# Patient Record
Sex: Female | Born: 1940 | Race: White | Hispanic: No | Marital: Married | Smoking: Never smoker
Health system: Southern US, Community
[De-identification: ages and names within clinical notes are randomized; demographics above are authoritative.]

## PROBLEM LIST (undated history)

## (undated) DIAGNOSIS — I639 Cerebral infarction, unspecified: Secondary | ICD-10-CM

## (undated) DIAGNOSIS — Z9889 Other specified postprocedural states: Secondary | ICD-10-CM

## (undated) DIAGNOSIS — F32A Depression, unspecified: Secondary | ICD-10-CM

## (undated) DIAGNOSIS — T8859XA Other complications of anesthesia, initial encounter: Secondary | ICD-10-CM

## (undated) DIAGNOSIS — E079 Disorder of thyroid, unspecified: Secondary | ICD-10-CM

## (undated) DIAGNOSIS — H3530 Unspecified macular degeneration: Secondary | ICD-10-CM

## (undated) DIAGNOSIS — I10 Essential (primary) hypertension: Secondary | ICD-10-CM

## (undated) DIAGNOSIS — R112 Nausea with vomiting, unspecified: Secondary | ICD-10-CM

## (undated) DIAGNOSIS — G4700 Insomnia, unspecified: Secondary | ICD-10-CM

## (undated) DIAGNOSIS — E785 Hyperlipidemia, unspecified: Secondary | ICD-10-CM

## (undated) DIAGNOSIS — M1990 Unspecified osteoarthritis, unspecified site: Secondary | ICD-10-CM

## (undated) DIAGNOSIS — E039 Hypothyroidism, unspecified: Secondary | ICD-10-CM

## (undated) DIAGNOSIS — Z9842 Cataract extraction status, left eye: Secondary | ICD-10-CM

## (undated) DIAGNOSIS — F329 Major depressive disorder, single episode, unspecified: Secondary | ICD-10-CM

## (undated) HISTORY — DX: Essential (primary) hypertension: I10

## (undated) HISTORY — PX: CATARACT EXTRACTION: SUR2

## (undated) HISTORY — PX: OTHER SURGICAL HISTORY: SHX169

## (undated) HISTORY — DX: Disorder of thyroid, unspecified: E07.9

## (undated) HISTORY — PX: CHOLECYSTECTOMY: SHX55

## (undated) HISTORY — PX: EYE SURGERY: SHX253

## (undated) HISTORY — PX: BREAST SURGERY: SHX581

## (undated) HISTORY — DX: Major depressive disorder, single episode, unspecified: F32.9

## (undated) HISTORY — DX: Depression, unspecified: F32.A

## (undated) HISTORY — DX: Cerebral infarction, unspecified: I63.9

## (undated) HISTORY — DX: Hyperlipidemia, unspecified: E78.5

## (undated) HISTORY — DX: Insomnia, unspecified: G47.00

## (undated) HISTORY — DX: Cataract extraction status, left eye: Z98.42

---

## 1999-09-02 ENCOUNTER — Other Ambulatory Visit: Admission: RE | Admit: 1999-09-02 | Discharge: 1999-09-02 | Payer: Self-pay | Admitting: Family Medicine

## 1999-11-21 ENCOUNTER — Ambulatory Visit (HOSPITAL_COMMUNITY): Admission: RE | Admit: 1999-11-21 | Discharge: 1999-11-21 | Payer: Self-pay

## 2001-10-09 ENCOUNTER — Other Ambulatory Visit: Admission: RE | Admit: 2001-10-09 | Discharge: 2001-10-09 | Payer: Self-pay | Admitting: Family Medicine

## 2002-04-16 ENCOUNTER — Emergency Department (HOSPITAL_COMMUNITY): Admission: EM | Admit: 2002-04-16 | Discharge: 2002-04-16 | Payer: Self-pay | Admitting: Emergency Medicine

## 2002-04-16 ENCOUNTER — Encounter: Payer: Self-pay | Admitting: Emergency Medicine

## 2002-07-18 ENCOUNTER — Encounter: Payer: Self-pay | Admitting: Family Medicine

## 2002-07-18 ENCOUNTER — Ambulatory Visit (HOSPITAL_COMMUNITY): Admission: RE | Admit: 2002-07-18 | Discharge: 2002-07-18 | Payer: Self-pay | Admitting: Family Medicine

## 2003-06-18 ENCOUNTER — Encounter: Admission: RE | Admit: 2003-06-18 | Discharge: 2003-08-15 | Payer: Self-pay | Admitting: Family Medicine

## 2004-10-16 ENCOUNTER — Ambulatory Visit: Payer: Self-pay | Admitting: Gastroenterology

## 2004-10-30 ENCOUNTER — Ambulatory Visit: Payer: Self-pay | Admitting: Gastroenterology

## 2006-04-25 ENCOUNTER — Other Ambulatory Visit: Admission: RE | Admit: 2006-04-25 | Discharge: 2006-04-25 | Payer: Self-pay | Admitting: Family Medicine

## 2007-11-01 ENCOUNTER — Ambulatory Visit: Payer: Self-pay | Admitting: Gastroenterology

## 2007-11-01 LAB — CONVERTED CEMR LAB
Anti Nuclear Antibody(ANA): POSITIVE — AB
Basophils Relative: 0.3 % (ref 0.0–1.0)
Bilirubin, Direct: 0.1 mg/dL (ref 0.0–0.3)
Ceruloplasmin: 43 mg/dL (ref 21–63)
Eosinophils Absolute: 0 10*3/uL (ref 0.0–0.6)
Eosinophils Relative: 1 % (ref 0.0–5.0)
HCT: 36 % (ref 36.0–46.0)
Hemoglobin: 12.5 g/dL (ref 12.0–15.0)
Hep B S Ab: NEGATIVE
Hepatitis B Surface Ag: NEGATIVE
Lymphocytes Relative: 25.7 % (ref 12.0–46.0)
MCV: 93.9 fL (ref 78.0–100.0)
Neutro Abs: 2.6 10*3/uL (ref 1.4–7.7)
Neutrophils Relative %: 63.4 % (ref 43.0–77.0)
Prothrombin Time: 11.7 s (ref 10.9–13.3)
Total Bilirubin: 0.8 mg/dL (ref 0.3–1.2)
Total Protein: 6.5 g/dL (ref 6.0–8.3)
WBC: 4 10*3/uL — ABNORMAL LOW (ref 4.5–10.5)

## 2008-01-23 ENCOUNTER — Ambulatory Visit: Payer: Self-pay | Admitting: Gastroenterology

## 2008-01-23 LAB — CONVERTED CEMR LAB
ALT: 52 units/L — ABNORMAL HIGH (ref 0–35)
Bilirubin, Direct: 0.1 mg/dL (ref 0.0–0.3)
Total Bilirubin: 0.7 mg/dL (ref 0.3–1.2)

## 2008-01-24 ENCOUNTER — Ambulatory Visit: Payer: Self-pay | Admitting: Gastroenterology

## 2009-04-09 ENCOUNTER — Encounter: Payer: Self-pay | Admitting: Gastroenterology

## 2009-04-30 ENCOUNTER — Encounter: Payer: Self-pay | Admitting: Gastroenterology

## 2009-05-01 ENCOUNTER — Ambulatory Visit: Payer: Self-pay | Admitting: Gastroenterology

## 2009-05-01 DIAGNOSIS — R945 Abnormal results of liver function studies: Secondary | ICD-10-CM | POA: Insufficient documentation

## 2009-05-08 ENCOUNTER — Encounter: Payer: Self-pay | Admitting: Gastroenterology

## 2009-05-08 ENCOUNTER — Ambulatory Visit (HOSPITAL_COMMUNITY): Admission: RE | Admit: 2009-05-08 | Discharge: 2009-05-08 | Payer: Self-pay | Admitting: Gastroenterology

## 2009-08-19 ENCOUNTER — Encounter: Admission: RE | Admit: 2009-08-19 | Discharge: 2009-08-19 | Payer: Self-pay | Admitting: Orthopedic Surgery

## 2009-12-13 ENCOUNTER — Emergency Department (HOSPITAL_COMMUNITY): Admission: EM | Admit: 2009-12-13 | Discharge: 2009-12-13 | Payer: Self-pay | Admitting: Emergency Medicine

## 2010-03-31 ENCOUNTER — Encounter: Payer: Self-pay | Admitting: Cardiology

## 2010-04-06 ENCOUNTER — Ambulatory Visit: Payer: Self-pay | Admitting: Cardiology

## 2010-04-06 DIAGNOSIS — I6930 Unspecified sequelae of cerebral infarction: Secondary | ICD-10-CM | POA: Insufficient documentation

## 2010-04-06 DIAGNOSIS — I10 Essential (primary) hypertension: Secondary | ICD-10-CM | POA: Insufficient documentation

## 2010-04-06 DIAGNOSIS — E785 Hyperlipidemia, unspecified: Secondary | ICD-10-CM | POA: Insufficient documentation

## 2010-05-05 ENCOUNTER — Telehealth (INDEPENDENT_AMBULATORY_CARE_PROVIDER_SITE_OTHER): Payer: Self-pay | Admitting: *Deleted

## 2010-05-08 ENCOUNTER — Telehealth (INDEPENDENT_AMBULATORY_CARE_PROVIDER_SITE_OTHER): Payer: Self-pay | Admitting: *Deleted

## 2010-06-01 ENCOUNTER — Inpatient Hospital Stay (HOSPITAL_COMMUNITY): Admission: RE | Admit: 2010-06-01 | Discharge: 2010-06-06 | Payer: Self-pay | Admitting: Orthopedic Surgery

## 2010-06-02 ENCOUNTER — Ambulatory Visit: Payer: Self-pay | Admitting: Physical Medicine & Rehabilitation

## 2010-11-26 NOTE — Progress Notes (Signed)
Summary: send clearance letter to [facility]  Phone Note From Other Clinic Call back at [facility] Phone [phone]   Caller: Olegario Messier from [facility] [phone] Request: Talk with Nurse Details for Reason: pls send clearance letter to [facility] fax # [phone]. att: Olegario Messier  Initial call taken by: Lorne Skeens,  May 08, 2010 10:38 AM  Follow-up for Phone Call        this letter was faxed 6/14 and 05/05/2010 and is being faxed again today Follow-up by: Charolotte Capuchin, RN,  May 08, 2010 10:53 AM

## 2010-11-26 NOTE — Miscellaneous (Signed)
Summary: Allergy  Allergy   Imported By: Elenor Legato 04/06/2010 12:39:31  _____________________________________________________________________  External Attachment:    Type:   Image     Comment:   External Document

## 2010-11-26 NOTE — Progress Notes (Signed)
Summary: cardiac clearence letter  Phone Note Call from Patient   Caller: Patient Reason for Call: Talk to Nurse Summary of Call: pt's cardiac clearence letter needs to be sent to [facility] att cathy blume [phone] Initial call taken by: Glynda Jaeger,  May 05, 2010 2:05 PM  Follow-up for Phone Call        letter (office note) faxed to [facility] - Att Consuela Mimes Follow-up by: Charolotte Capuchin, RN,  May 05, 2010 2:23 PM

## 2010-11-26 NOTE — Letter (Signed)
Summary: [facility]  [facility]   Imported By: Marylou Mccoy 05/27/2010 10:51:32  _____________________________________________________________________  External Attachment:    Type:   Image     Comment:   External Document

## 2010-11-26 NOTE — Assessment & Plan Note (Signed)
Summary: NP6/CARDIAC CLEARENCE BEFORE KNEE REPLAC/AETN/MT   Visit Type:  Follow-up Referring Provider:  Gean Birchwood Primary Provider:  [facility]  CC:  HTN and Hyperlipidemia.  History of Present Illness: The patient presents for evaluation prior to having knee replacement. Her past cardiovascular history includes an embolic stroke in 1997. The etiology of the presumed embolism was never made clear. She had residual right-sided weakness as a result. She's been limited now by knee pain and falls. She walks with walker. She walks around her home. With this level of activity she denies any chest pressure, neck or arm discomfort. She does not have palpitations, presyncope or syncope. She does not have PND or orthopnea. She thinks her level of breathing is in keeping with her weight, deconditioning and level of activity. She does not report any prior cardiac testing other than EKGs.  Current Medications (verified): 1)  Carbatrol 300 Mg Xr12h-Cap (Carbamazepine) .... 2 By Mouth Once Daily 2)  Citalopram Hydrobromide 40 Mg Tabs (Citalopram Hydrobromide) .Marland Kitchen.. 1 By Mouth Once Daily 3)  Simvastatin 20 Mg Tabs (Simvastatin) .Marland Kitchen.. 1 By Mouth Once Daily 4)  Hydrocodone-Acetaminophen 10-325 Mg Tabs (Hydrocodone-Acetaminophen) .... One Tablet By Mouth Four Times Daily As Needed 5)  Aspirin 325 Mg Tabs (Aspirin) .Marland Kitchen.. 1 By Mouth Once Daily 6)  Flax Seed Oil 1000 Mg Caps (Flaxseed (Linseed)) .... 2 Tablets By Mouth Once Daily 7)  Caltrate 600 1500 Mg Tabs (Calcium Carbonate) .... One Tablet By Mouth  Two Times A Day 8)  Fish Oil   Oil (Fish Oil) .... One Tablet By Mouth Once Daily 9)  Eye Vitamins  Caps (Multiple Vitamins-Minerals) .... Two Tablets By Mouth Two Times A Day 10)  Vitamin D 1000 Unit Tabs (Cholecalciferol) .... One Tablet By Mouth Once Daily 11)  Super B Complex  Tabs (B Complex-C) .... One Tablet By Mouth Once Daily 12)  Tribenzor 40-10-25 Mg Tabs  (Olmesartan-Amlodipine-Hctz) .Marland Kitchen.. 1 By Mouth Daily  Allergies (verified): No Known Drug Allergies  Past History:  Past Medical History: Diverticulosis Anxiety Disorder Arthritis Hypertension Embolic CVA Hyperlipidemia  Family History: No FH of Colon Cancer: Lung Cancer: Father  There is no early heart disease in first-degree relatives.  Review of Systems       Positive for right-sided resting tremor, thalamic pain syndrome. Otherwise as stated in the history of present illness negative for all other systems.  Vital Signs:  Patient profile:   69 year old female Height:      65 inches Weight:      181 pounds BMI:     30.23 Pulse rate:   79 / minute Resp:     18 per minute BP sitting:   140 / 62  (left arm)  Vitals Entered By: Marrion Coy, CNA (April 06, 2010 12:33 PM)  Physical Exam  General:  Well developed, well nourished, in no acute distress. Head:  normocephalic and atraumatic Eyes:  PERRLA/EOM intact; conjunctiva and lids normal. Mouth:  Teeth, gums and palate normal. Oral mucosa normal. Neck:  Neck supple, no JVD. No masses, thyromegaly or abnormal cervical nodes. Chest Wall:  no deformities or breast masses noted Lungs:  Clear bilaterally to auscultation and percussion. Abdomen:  Bowel sounds positive; abdomen soft and non-tender without masses, organomegaly, or hernias noted. No hepatosplenomegaly. Msk:  Back normal, abnormal gait, right-sided muscle weakness upper or lower extremities mild contractures Extremities:  No clubbing or cyanosis. Neurologic:  Alert and oriented x 3. Skin:  Intact without lesions or  rashes. Cervical Nodes:  no significant adenopathy Axillary Nodes:  no significant adenopathy Inguinal Nodes:  no significant adenopathy Psych:  Normal affect.   Detailed Cardiovascular Exam  Neck    Carotids: Carotids full and equal bilaterally without bruits.      Neck Veins: Normal, no JVD.    Heart    Inspection: no deformities or lifts  noted.      Palpation: normal PMI with no thrills palpable.      Auscultation: regular rate and rhythm, S1, S2 without murmurs, rubs, gallops, or clicks.    Vascular    Abdominal Aorta: no palpable masses, pulsations, or audible bruits.      Femoral Pulses: normal femoral pulses bilaterally.      Pedal Pulses: normal pedal pulses bilaterally.      Radial Pulses: normal radial pulses bilaterally.      Peripheral Circulation: no clubbing, cyanosis, or edema noted with normal capillary refill.     EKG  Procedure date:  04/06/2010  Findings:      Sinus rhythm, rate 79, axis within normal limits, intervals within normal limits, no acute ST-T wave changes.  Impression & Recommendations:  Problem # 1:  PREOPERATIVE EXAMINATION (ICD-V72.84) The patient has no high risk findings according to ACC/AHA guidelines. She does have a low functional level. She is going for a study that is no higher than moderate risk from a cardiovascular standpoint. Given this, according to guidelines, no further cardiovascular testing is suggested. The patient would be an acceptable risk for the planned surgery.  Problem # 2:  ESSENTIAL HYPERTENSION, BENIGN (ICD-401.1) We reviewed her blood pressures at home. At this point I would recommend diet and weight control but would consider adding antihypertensive medication if her pressure is seen to be elevated in the future. Orders: EKG w/ Interpretation (93000)  Problem # 3:  DYSLIPIDEMIA (ICD-272.4) I reviewed her lipid profiles available to me. I don't see any recent ones but she says they are drawn routinely by her primary provider. I will defer to his care.  Patient Instructions: 1)  Your physician recommends that you schedule a follow-up appointment as needed 2)  Your physician recommends that you continue on your current medications as directed. Please refer to the Current Medication list given to you today.

## 2011-01-08 LAB — BASIC METABOLIC PANEL
CO2: 28 mEq/L (ref 19–32)
CO2: 28 mEq/L (ref 19–32)
Calcium: 8.4 mg/dL (ref 8.4–10.5)
Calcium: 9.8 mg/dL (ref 8.4–10.5)
Chloride: 92 mEq/L — ABNORMAL LOW (ref 96–112)
Chloride: 92 mEq/L — ABNORMAL LOW (ref 96–112)
Creatinine, Ser: 0.44 mg/dL (ref 0.4–1.2)
GFR calc Af Amer: >60 mL/min (ref >60)
GFR calc Af Amer: >60 mL/min (ref >60)
GFR calc Af Amer: >60 mL/min (ref >60)
GFR calc non Af Amer: >60 mL/min (ref >60)
GFR calc non Af Amer: >60 mL/min (ref >60)
Glucose, Bld: 93 mg/dL (ref 70–99)
Potassium: 3.3 mEq/L — ABNORMAL LOW (ref 3.5–5.1)
Potassium: 3.6 mEq/L (ref 3.5–5.1)
Potassium: 3.7 mEq/L (ref 3.5–5.1)
Sodium: 124 mEq/L — ABNORMAL LOW (ref 135–145)
Sodium: 129 mEq/L — ABNORMAL LOW (ref 135–145)
Sodium: 129 mEq/L — ABNORMAL LOW (ref 135–145)
Sodium: 132 mEq/L — ABNORMAL LOW (ref 135–145)

## 2011-01-08 LAB — URINALYSIS, ROUTINE W REFLEX MICROSCOPIC
Bilirubin Urine: NEGATIVE
Ketones, ur: NEGATIVE mg/dL
Nitrite: NEGATIVE
Protein, ur: NEGATIVE mg/dL
pH: 7.5 (ref 5.0–8.0)

## 2011-01-08 LAB — DIFFERENTIAL
Basophils Relative: 0 % (ref 0–1)
Eosinophils Absolute: 0.1 10*3/uL (ref 0.0–0.7)
Eosinophils Relative: 1 % (ref 0–5)
Lymphs Abs: 1.5 10*3/uL (ref 0.7–4.0)
Monocytes Absolute: 0.5 10*3/uL (ref 0.1–1.0)
Monocytes Relative: 9 % (ref 3–12)

## 2011-01-08 LAB — PROTIME-INR
INR: 0.93 (ref 0.00–1.49)
INR: 0.97 (ref 0.00–1.49)
INR: 1.37 (ref 0.00–1.49)
INR: 1.38 (ref 0.00–1.49)
Prothrombin Time: 13.1 seconds (ref 11.6–15.2)
Prothrombin Time: 17.1 seconds — ABNORMAL HIGH (ref 11.6–15.2)

## 2011-01-08 LAB — CBC
HCT: 26.7 % — ABNORMAL LOW (ref 36.0–46.0)
HCT: 28.6 % — ABNORMAL LOW (ref 36.0–46.0)
HCT: 29.5 % — ABNORMAL LOW (ref 36.0–46.0)
HCT: 37.2 % (ref 36.0–46.0)
Hemoglobin: 10.2 g/dL — ABNORMAL LOW (ref 12.0–15.0)
Hemoglobin: 13.2 g/dL (ref 12.0–15.0)
Hemoglobin: 9.1 g/dL — ABNORMAL LOW (ref 12.0–15.0)
Hemoglobin: 9.9 g/dL — ABNORMAL LOW (ref 12.0–15.0)
MCH: 31.6 pg (ref 26.0–34.0)
MCH: 32.2 pg (ref 26.0–34.0)
MCHC: 35.5 g/dL (ref 30.0–36.0)
MCV: 90.7 fL (ref 78.0–100.0)
MCV: 91.8 fL (ref 78.0–100.0)
Platelets: 184 10*3/uL (ref 150–400)
RBC: 2.91 MIL/uL — ABNORMAL LOW (ref 3.87–5.11)
RBC: 3.15 MIL/uL — ABNORMAL LOW (ref 3.87–5.11)
RBC: 3.23 MIL/uL — ABNORMAL LOW (ref 3.87–5.11)
RBC: 4.1 MIL/uL (ref 3.87–5.11)
WBC: 6.9 10*3/uL (ref 4.0–10.5)
WBC: 7.7 10*3/uL (ref 4.0–10.5)

## 2011-01-08 LAB — TYPE AND SCREEN
ABO/RH(D): O POS
Antibody Screen: NEGATIVE

## 2011-01-08 LAB — URINE MICROSCOPIC-ADD ON

## 2011-01-08 LAB — SURGICAL PCR SCREEN: MRSA, PCR: NEGATIVE

## 2011-01-08 LAB — ABO/RH: ABO/RH(D): O POS

## 2011-03-09 NOTE — Assessment & Plan Note (Signed)
[facility]                         GASTROENTEROLOGY OFFICE NOTE   SELINE, ENZOR                         MRN:          [id number]  DATE:11/01/2007                            DOB:          April 18, 1941    GASTROENTEROLOGY CONSULTATION   REASON FOR CONSULTATION:  Abnormal liver tests.   HISTORY OF PRESENT ILLNESS:  Allison Parker is a pleasant 70 year old  white female referred through the courtesy of Dr. Jacqulyn Bath for evaluation.  Abnormal liver tests were recently noted.  Allison Parker has no GI  complaints including change in bowel habits, abdominal pain, or weight  loss.  She had hepatitis as a child.  She drinks rarely.  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   PAST MEDICAL HISTORY:  Past medical history is pertinent for a CVA.  She  has hypertension.  She is status post cholecystectomy and lumpectomy.  She underwent screening colonoscopy in 2006 that demonstrated  diverticulosis.   MEDICATIONS:  Her medications include carvedilol, Citalopram,  amlodipine, simvastatin, aspirin.   ALLERGIES:  She has no allergies.   SOCIAL HISTORY:  She does not smoke.  She drinks rarely.  She is married  and works at [employer].   REVIEW OF SYSTEMS:  Positive for urinary frequency and joint pains,  night sweats and back pain.   PHYSICAL EXAMINATION:  VITAL SIGNS:  Pulse 68, blood pressure 130/80.  Weight 190.  SKIN:  There is no stigmata of liver disease.  HEENT:  EOMI.  PERRLA.  Sclerae are anicteric.  Conjunctivae are pink.  NECK:  Supple without thyromegaly, adenopathy or carotid bruits.  CHEST:  Clear to auscultation and percussion without adventitious  sounds.  CARDIAC:  Regular rhythm; normal S1 S2.  There are no murmurs, gallops  or rubs.  ABDOMEN:  Bowel sounds are normoactive.  Abdomen is soft, nontender and  nondistended.  There are no abdominal masses, tenderness, splenic  enlargement or  hepatomegaly.  EXTREMITIES:  Full range of motion.  No cyanosis, clubbing or edema.  RECTAL:  Deferred.   LABORATORY DATA:  On September 28, 2007 AST was 41, ALT 97, GTT 573 and  alkaline phosphatase 132.  Bilirubin was normal.   IMPRESSION:  1. Abnormal liver tests.  I suspect this is due to mild hepatic      steatosis.  2. Diverticulosis.  3. History of cerebrovascular accident.   RECOMMENDATIONS:  1. Check serologies for hepatitis B and C, alpha fetoprotein, AMA,      ANA, antismooth muscle antibody, ceruloplasmin level.  2. If the above are negative, I will repeat her liver function tests      in approximately 3 months.     Barbette Hair. Arlyce Dice, MD,FACG  Electronically Signed    RDK/MedQ  DD: 11/01/2007  DT: 11/01/2007  Job #: [id number]   cc:   Dr. Lindaann Pascal

## 2011-03-09 NOTE — Letter (Signed)
November 01, 2007    Allison Parker   RE:  ROVENA, HEARLD  MRN:  [id number]  /  DOB:  [birth year]   Dear Allison Hong:   It is my pleasure to have treated you recently as a new patient in my  office.  I appreciate your confidence and the opportunity to participate  in your care.   Since I do have a busy inpatient endoscopy schedule and office schedule,  my office hours vary weekly.  I am, however, available for emergency  calls every day through my office.  If I cannot promptly meet an urgent  office appointment, another one of our gastroenterologists will be able  to assist you.   My well-trained staff are prepared to help you at all times.  For  emergencies after office hours, a physician from our gastroenterology  section is always available through my 24-hour answering service.   While you are under my care, I encourage discussion of your questions  and concerns, and I will be happy to return your calls as soon as I am  available.   Once again, I welcome you as a new patient and I look forward to a happy  and healthy relationship.    Sincerely,      Barbette Hair. Arlyce Dice, MD,FACG  Electronically Signed   RDK/MedQ  DD: 11/01/2007  DT: 11/01/2007  Job #: [id number]

## 2011-03-09 NOTE — Letter (Signed)
November 01, 2007    Lindaann Pascal, M.D.   RE:  [name], [name]  MRN:  [id number]  /  DOB:  24-Jun-1941   Dear Dr. Jacqulyn Bath:   Upon your kind referral, I had the pleasure of evaluating your patient  and I am pleased to offer my findings.  I saw Allison Parker in the office  today.  Enclosed is a copy of my progress note that details my findings  and recommendations.   Thank you for the opportunity to participate in your patient's care.    Sincerely,      Barbette Hair. Arlyce Dice, MD,FACG  Electronically Signed    RDK/MedQ  DD: 11/01/2007  DT: 11/01/2007  Job #: [id number]

## 2011-03-09 NOTE — Assessment & Plan Note (Signed)
[facility]                         GASTROENTEROLOGY OFFICE NOTE   [name], [name]                         MRN:          [id number]  DATE:01/24/2008                            DOB:          09/29/1941    PROBLEM:  Abnormal liver tests.   Allison Parker has returned for scheduled GI followup.  She continues to  feel well.  Repeat liver tests were pertinent for an ALT of 52.  Other  liver tests were normal.  Serologies for hepatitis B and C were  negative.  There is no evidence by serologic testing for Wilson's  disease, autoimmune hepatitis, or alpha 1 antitrypsin deficiency or PBC.   EXAM:  Pulse 68, blood pressure 132/80, weight 182.   IMPRESSION:  Transient abnormal liver tests.  Etiology is not clear,  though there does not appear to be evidence for underlying liver  disease.   RECOMMENDATIONS:  1. No further GI workup.  2. Repeat LFTs in approximately three months.  I instructed Mrs.      Parker to have those checked at Dr. Edwin Dada office.     Barbette Hair. Arlyce Dice, MD,FACG  Electronically Signed    RDK/MedQ  DD: 01/24/2008  DT: 01/24/2008  Job #: [id number]

## 2011-05-26 HISTORY — PX: TOTAL KNEE ARTHROPLASTY: SHX125

## 2011-11-04 ENCOUNTER — Encounter: Payer: Self-pay | Admitting: Gastroenterology

## 2013-01-15 ENCOUNTER — Telehealth: Payer: Self-pay | Admitting: Pharmacist

## 2013-01-15 MED ORDER — HYDROCODONE-ACETAMINOPHEN 10-325 MG PO TABS: 1.0000 | ORAL_TABLET | Freq: Four times a day (QID) | ORAL | Status: DC | PRN

## 2013-01-15 NOTE — Telephone Encounter (Signed)
Pt requesting refill on hydrocodone/APAP 10/325mg  1 po QID prn pain.   Last rx written 11/15/2012. Please fax to [facility] once signed

## 2013-02-14 ENCOUNTER — Other Ambulatory Visit: Payer: Self-pay | Admitting: Pharmacist

## 2013-02-14 DIAGNOSIS — G890 Central pain syndrome: Secondary | ICD-10-CM

## 2013-02-14 DIAGNOSIS — M4800 Spinal stenosis, site unspecified: Secondary | ICD-10-CM

## 2013-02-14 MED ORDER — HYDROCODONE-ACETAMINOPHEN 10-325 MG PO TABS: 1.0000 | ORAL_TABLET | Freq: Four times a day (QID) | ORAL | Status: DC | PRN

## 2013-02-14 NOTE — Telephone Encounter (Signed)
Rx was actually called to [facility] due to poor quality of transmission when faxed.   Patient notified.

## 2013-02-14 NOTE — Telephone Encounter (Signed)
Rx for hydrocodone/APAP 10/325mg  take 1 tablet QID printed for Dr. Modesto Charon to sign.

## 2013-02-26 ENCOUNTER — Other Ambulatory Visit: Payer: Self-pay | Admitting: Family Medicine

## 2013-02-27 ENCOUNTER — Ambulatory Visit (INDEPENDENT_AMBULATORY_CARE_PROVIDER_SITE_OTHER): Payer: Medicare FFS

## 2013-02-27 ENCOUNTER — Encounter: Payer: Self-pay | Admitting: Family Medicine

## 2013-02-27 ENCOUNTER — Ambulatory Visit (INDEPENDENT_AMBULATORY_CARE_PROVIDER_SITE_OTHER): Payer: Medicare FFS | Admitting: Family Medicine

## 2013-02-27 VITALS — BP 145/78 | HR 76 | Temp 98.5°F | Wt 188.6 lb

## 2013-02-27 DIAGNOSIS — R1031 Right lower quadrant pain: Secondary | ICD-10-CM

## 2013-02-27 DIAGNOSIS — I6350 Cerebral infarction due to unspecified occlusion or stenosis of unspecified cerebral artery: Secondary | ICD-10-CM

## 2013-02-27 DIAGNOSIS — R350 Frequency of micturition: Secondary | ICD-10-CM

## 2013-02-27 DIAGNOSIS — G4700 Insomnia, unspecified: Secondary | ICD-10-CM

## 2013-02-27 DIAGNOSIS — G8929 Other chronic pain: Secondary | ICD-10-CM

## 2013-02-27 DIAGNOSIS — E039 Hypothyroidism, unspecified: Secondary | ICD-10-CM

## 2013-02-27 DIAGNOSIS — I10 Essential (primary) hypertension: Secondary | ICD-10-CM

## 2013-02-27 DIAGNOSIS — E785 Hyperlipidemia, unspecified: Secondary | ICD-10-CM

## 2013-02-27 DIAGNOSIS — R945 Abnormal results of liver function studies: Secondary | ICD-10-CM

## 2013-02-27 LAB — COMPLETE METABOLIC PANEL WITH GFR
ALT: 16 U/L (ref 0–35)
AST: 18 U/L (ref 0–37)
Albumin: 4.3 g/dL (ref 3.5–5.2)
Alkaline Phosphatase: 89 U/L (ref 39–117)
BUN: 14 mg/dL (ref 6–23)
CO2: 26 mEq/L (ref 19–32)
Calcium: 9.5 mg/dL (ref 8.4–10.5)
Chloride: 102 mEq/L (ref 96–112)
Creat: 0.69 mg/dL (ref 0.50–1.10)
GFR, Est African American: >89 mL/min
GFR, Est Non African American: 89 mL/min
Glucose, Bld: 87 mg/dL (ref 70–99)
Potassium: 4.5 mEq/L (ref 3.5–5.3)
Sodium: 137 mEq/L (ref 135–145)
Total Bilirubin: 0.6 mg/dL (ref 0.3–1.2)
Total Protein: 6.8 g/dL (ref 6.0–8.3)

## 2013-02-27 LAB — POCT UA - MICROSCOPIC ONLY
Casts, Ur, LPF, POC: NEGATIVE
Crystals, Ur, HPF, POC: NEGATIVE
WBC, Ur, HPF, POC: NEGATIVE
Yeast, UA: NEGATIVE

## 2013-02-27 LAB — POCT URINALYSIS DIPSTICK
Bilirubin, UA: NEGATIVE
Glucose, UA: NEGATIVE
Ketones, UA: NEGATIVE
Leukocytes, UA: NEGATIVE
Nitrite, UA: NEGATIVE
Protein, UA: NEGATIVE
Spec Grav, UA: 1.02
Urobilinogen, UA: NEGATIVE
pH, UA: 5

## 2013-02-27 LAB — TSH: TSH: 1.228 u[IU]/mL (ref 0.350–4.500)

## 2013-02-27 MED ORDER — ZOLPIDEM TARTRATE 10 MG PO TABS: 10.0000 mg | ORAL_TABLET | Freq: Every evening | ORAL | Status: DC | PRN

## 2013-02-27 NOTE — Progress Notes (Signed)
Subjective:     Patient ID: Norwood Levo Balthaser, female   DOB: [birth year], 72 y.o.   MRN: [id number]  HPI  Frequency of urination. Thought it was due to diuretic now thinks it is OAB. Also, has a history of back pains and thinks it is kidney stones. Pain in the RLQ of the abdomen and in the right lumbar area.  Patient is here for follow up of hyperlipidemia: denies Headache;denies Chest Pain;denies weakness;denies Shortness of Breath and orthopnea;denies Visual changes;denies palpitations;denies cough;denies pedal edema;denies any new  symptoms of TIA or stroke;deniesClaudication symptoms. admits to Compliance with medications; denies Problems with medications.  No past medical history on file. No past surgical history on file. History   Social History  . Marital Status: Married    Spouse Name: N/A    Number of Children: N/A  . Years of Education: N/A   Occupational History  . Not on file.   Social History Main Topics  . Smoking status: Never Smoker   . Smokeless tobacco: Not on file  . Alcohol Use: Not on file  . Drug Use: Not on file  . Sexually Active: Not on file   Other Topics Concern  . Not on file   Social History Narrative  . No narrative on file   No family history on file. Current Outpatient Prescriptions on File Prior to Visit  Medication Sig Dispense Refill  . HYDROcodone-acetaminophen (NORCO) 10-325 MG per tablet Take 1 tablet by mouth every 6 (six) hours as needed for pain.  120 tablet  0  . levothyroxine (SYNTHROID, LEVOTHROID) 25 MCG tablet TAKE ONE TABLET BY MOUTH ONE TIME DAILY  30 tablet  0   No current facility-administered medications on file prior to visit.   Allergies  Allergen Reactions  . Ace Inhibitors     There is no immunization history on file for this patient. Prior to Admission medications   Medication Sig Start Date End Date Taking? Authorizing Provider  amLODipine (NORVASC) 5 MG tablet Take 5 mg by mouth daily.   Yes Historical  Provider, MD  baclofen (LIORESAL) 20 MG tablet Take 20 mg by mouth 2 (two) times daily.   Yes Historical Provider, MD  Calcium Carbonate-Vitamin D (SM CALCIUM 500/VITAMIN D3 PO) Take by mouth.   Yes Historical Provider, MD  Cholecalciferol (VITAMIN D-1000 MAX ST PO) Take 0.5 tablets by mouth daily.   Yes Historical Provider, MD  citalopram (CELEXA) 40 MG tablet Take 40 mg by mouth daily.   Yes Historical Provider, MD  Coenzyme Q10 (CO Q-10) 300 MG CAPS Take 1 capsule by mouth daily.   Yes Historical Provider, MD  HYDROcodone-acetaminophen (NORCO) 10-325 MG per tablet Take 1 tablet by mouth every 6 (six) hours as needed for pain. 02/14/13  Yes Ileana Ladd, MD  levothyroxine (SYNTHROID, LEVOTHROID) 25 MCG tablet TAKE ONE TABLET BY MOUTH ONE TIME DAILY 02/26/13  Yes Ileana Ladd, MD  multivitamin-lutein [facility]) CAPS Take 1 capsule by mouth daily.   Yes Historical Provider, MD  simvastatin (ZOCOR) 20 MG tablet Take 20 mg by mouth every evening.   Yes Historical Provider, MD  zolpidem (AMBIEN) 10 MG tablet Take 10 mg by mouth at bedtime as needed for sleep.   Yes Historical Provider, MD      Review of Systems    no other complaints other than above. Objective:   Physical Exam APPEARANCE:  Patient in no acute distress.The patient appeared well nourished and normally developed. Acyanotic. Waist: VITAL SIGNS:BP 145/78  Pulse 76  Temp(Src) 98.5 F (36.9 C) (Oral)  Wt 188 lb 9.6 oz (85.548 kg)  BMI 31.38 kg/m2 Obese WF  SKIN: warm and  Dry without overt rashes, tattoos and scars  HEAD and Neck: without JVD, Head and scalp: normal Eyes:No scleral icterus. Fundi normal, eye movements normal. Ears: Auricle normal, canal normal, Tympanic membranes normal, insufflation normal. Nose: normal Throat: normal Neck & thyroid: normal  CHEST & LUNGS: Chest wall: normal Lungs: Clear  CVS: Reveals the PMI to be normally located. Regular rhythm, First and Second Heart sounds are  normal,  absence of murmurs, rubs or gallops. Peripheral vasculature: Radial pulses: normal Dorsal pedis pulses: normal Posterior pulses: normal  ABDOMEN:  Appearance: normal Benign,, no organomegaly, no masses, no Abdominal Aortic enlargement. No Guarding , no rebound. No Bruits. Bowel sounds: normal  EXTREMETIES: nonedematous. Both Femoral and Pedal pulses are normal.  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 citalopram (CELEXA) 40 MG tablet    Sig: Take 40 mg by mouth daily.  Marland Kitchen amLODipine (NORVASC) 5 MG tablet    Sig: Take 5 mg by mouth daily.  Marland Kitchen DISCONTD: zolpidem (AMBIEN) 10 MG tablet    Sig: Take 10 mg by mouth at bedtime as needed for sleep.  . simvastatin (ZOCOR) 20 MG tablet    Sig: Take 20 mg by mouth every evening.  . Calcium Carbonate-Vitamin D (SM CALCIUM 500/VITAMIN D3 PO)    Sig: Take by mouth.  . baclofen (LIORESAL) 20 MG tablet    Sig: Take 20 mg by mouth 2 (two) times daily.  . multivitamin-lutein (OCUVITE-LUTEIN) CAPS    Sig: Take 1 capsule by mouth daily.  . Cholecalciferol (VITAMIN D-1000 MAX ST PO)    Sig: Take 0.5 tablets by mouth daily.  . Coenzyme Q10 (CO Q-10) 300 MG CAPS    Sig: Take 1 capsule by mouth daily.  Marland Kitchen zolpidem (AMBIEN) 10 MG tablet    Sig: Take 1 tablet (10 mg total) by mouth at bedtime as needed  for sleep.    Dispense:  30 tablet    Refill:  1   Orders Placed This Encounter  Procedures  . Urine culture  . DG Abd 1 View    Standing Status: Future     Number of Occurrences: 1     Standing Expiration Date: 04/29/2014    Order Specific Question:  Reason for Exam (SYMPTOM  OR DIAGNOSIS REQUIRED)    Answer:  RLQ pain and right lumbar pain, R/O stone    Order Specific Question:  Preferred imaging location?    Answer:  Internal  . COMPLETE METABOLIC PANEL WITH GFR  . NMR Lipoprofile with Lipids  . TSH  . Ambulatory referral to Urology    Referral Priority:  Routine    Referral Type:  Consultation    Referral Reason:  Specialty Services  Required    Requested Specialty:  Urology    Number of Visits Requested:  1  . POCT UA - Microscopic Only  . POCT urinalysis dipstick   [facility] reading (PRIMARY) by  Dr.Ambar Raphael: KUB: degenerative changes in Lumbar spine. No obvious  Stones seen. Referred to Urology. Results for orders placed in visit on 02/27/13  POCT UA - MICROSCOPIC ONLY      Result Value Range   WBC, Ur, HPF, POC neg     RBC, urine, microscopic 1-2     Bacteria, U Microscopic occ     Mucus, UA trace     Epithelial cells, urine per micros occ     Crystals, Ur, HPF, POC neg     Casts, Ur, LPF, POC neg     Yeast, UA neg    POCT URINALYSIS DIPSTICK      Result Value Range   Color, UA yellow     Clarity, UA clear     Glucose, UA neg     Bilirubin, UA neg     Ketones, UA neg     Spec Grav, UA 1.020     Blood, UA trace     pH, UA 5.0     Protein, UA neg     Urobilinogen, UA negative     Nitrite, UA neg     Leukocytes, UA Negative      Continue present regimen. Increased fluids, adding lemon to water. Save any stones that may pass, if she has stones.  RTC 4 months.  Poppi Scantling P. Modesto Charon, M.D.

## 2013-03-01 LAB — NMR LIPOPROFILE WITH LIPIDS
Cholesterol, Total: 157 mg/dL (ref <200)
HDL Particle Number: 50.3 umol/L (ref >=30.5)
HDL Size: 9.5 nm (ref >=9.2)
HDL-C: 72 mg/dL (ref >=40)
LDL (calc): 66 mg/dL (ref <100)
LDL Particle Number: 1058 nmol/L — ABNORMAL HIGH (ref <1000)
LDL Size: 20.3 nm — ABNORMAL LOW (ref >20.5)
LP-IR Score: 36 (ref <=45)
Large HDL-P: 14.5 umol/L (ref >=4.8)
Large VLDL-P: 2.3 nmol/L (ref <=2.7)
Small LDL Particle Number: 726 nmol/L — ABNORMAL HIGH (ref <=527)
Triglycerides: 97 mg/dL (ref <150)
VLDL Size: 46.8 nm — ABNORMAL HIGH (ref <=46.6)

## 2013-03-01 LAB — URINE CULTURE
Colony Count: NO GROWTH
Organism ID, Bacteria: NO GROWTH

## 2013-03-01 NOTE — Progress Notes (Signed)
Quick Note:  Lab result at goal.Urine culture negative. No change in Medications for now. No Change in plans and follow up. ______

## 2013-03-12 ENCOUNTER — Other Ambulatory Visit: Payer: Self-pay | Admitting: Family Medicine

## 2013-03-15 ENCOUNTER — Telehealth: Payer: Self-pay | Admitting: Pharmacist

## 2013-03-15 DIAGNOSIS — M4800 Spinal stenosis, site unspecified: Secondary | ICD-10-CM

## 2013-03-15 DIAGNOSIS — G890 Central pain syndrome: Secondary | ICD-10-CM

## 2013-03-15 MED ORDER — HYDROCODONE-ACETAMINOPHEN 10-325 MG PO TABS: 1.0000 | ORAL_TABLET | Freq: Four times a day (QID) | ORAL | Status: DC | PRN

## 2013-03-26 ENCOUNTER — Other Ambulatory Visit: Payer: Self-pay | Admitting: Family Medicine

## 2013-04-16 ENCOUNTER — Telehealth: Payer: Self-pay | Admitting: Pharmacist

## 2013-04-16 DIAGNOSIS — G890 Central pain syndrome: Secondary | ICD-10-CM

## 2013-04-16 DIAGNOSIS — M4800 Spinal stenosis, site unspecified: Secondary | ICD-10-CM

## 2013-04-16 MED ORDER — HYDROCODONE-ACETAMINOPHEN 10-325 MG PO TABS: 1.0000 | ORAL_TABLET | Freq: Four times a day (QID) | ORAL | Status: DC | PRN

## 2013-04-16 NOTE — Telephone Encounter (Signed)
rx for norco printed and will get Dr. Modesto Charon to sign.

## 2013-04-16 NOTE — Telephone Encounter (Signed)
Dr Modesto Charon not in office for next 2 weeks - will get Dr Christell Constant to sign.

## 2013-05-15 ENCOUNTER — Other Ambulatory Visit: Payer: Self-pay | Admitting: Pharmacist Clinician (PhC)/ Clinical Pharmacy Specialist

## 2013-05-15 DIAGNOSIS — E785 Hyperlipidemia, unspecified: Secondary | ICD-10-CM

## 2013-05-15 MED ORDER — SIMVASTATIN 20 MG PO TABS: 20.0000 mg | ORAL_TABLET | Freq: Every day | ORAL | Status: DC

## 2013-05-21 ENCOUNTER — Telehealth: Payer: Self-pay | Admitting: Family Medicine

## 2013-05-21 DIAGNOSIS — G890 Central pain syndrome: Secondary | ICD-10-CM

## 2013-05-21 DIAGNOSIS — M4800 Spinal stenosis, site unspecified: Secondary | ICD-10-CM

## 2013-05-21 MED ORDER — HYDROCODONE-ACETAMINOPHEN 10-325 MG PO TABS: 1.0000 | ORAL_TABLET | Freq: Four times a day (QID) | ORAL | Status: DC | PRN

## 2013-05-21 NOTE — Telephone Encounter (Signed)
I didn't see a call from last week documented. Discussed with Dr Modesto Charon and Rx was OK's. Rx called in to [facility], [place]

## 2013-06-18 ENCOUNTER — Telehealth: Payer: Self-pay | Admitting: Pharmacist

## 2013-06-18 NOTE — Telephone Encounter (Signed)
Norco 10/325mg  last filled 05/21/2013 (next due 06/20/2013) Next appt with Dr Modesto Charon 07/03/2013. Appt with clinical pharmacist 08/14/2013.

## 2013-06-19 ENCOUNTER — Other Ambulatory Visit: Payer: Self-pay | Admitting: Family Medicine

## 2013-06-19 DIAGNOSIS — M4800 Spinal stenosis, site unspecified: Secondary | ICD-10-CM

## 2013-06-19 DIAGNOSIS — G890 Central pain syndrome: Secondary | ICD-10-CM

## 2013-06-19 MED ORDER — HYDROCODONE-ACETAMINOPHEN 10-325 MG PO TABS: 1.0000 | ORAL_TABLET | Freq: Three times a day (TID) | ORAL | Status: DC | PRN

## 2013-06-19 NOTE — Telephone Encounter (Signed)
I cut back to #90 pills because the studies are showing strong effects on the liver.

## 2013-06-19 NOTE — Telephone Encounter (Signed)
Pt aware to pick up rx and aware of the below recommendations. Pt verbalized understanding

## 2013-06-27 ENCOUNTER — Other Ambulatory Visit: Payer: Self-pay | Admitting: Family Medicine

## 2013-07-03 ENCOUNTER — Ambulatory Visit (INDEPENDENT_AMBULATORY_CARE_PROVIDER_SITE_OTHER): Payer: Medicare FFS | Admitting: Family Medicine

## 2013-07-03 ENCOUNTER — Encounter: Payer: Self-pay | Admitting: Family Medicine

## 2013-07-03 VITALS — BP 132/77 | HR 86 | Temp 98.8°F | Wt 190.4 lb

## 2013-07-03 DIAGNOSIS — R350 Frequency of micturition: Secondary | ICD-10-CM

## 2013-07-03 DIAGNOSIS — R945 Abnormal results of liver function studies: Secondary | ICD-10-CM

## 2013-07-03 DIAGNOSIS — N3281 Overactive bladder: Secondary | ICD-10-CM

## 2013-07-03 DIAGNOSIS — M4800 Spinal stenosis, site unspecified: Secondary | ICD-10-CM

## 2013-07-03 DIAGNOSIS — G4700 Insomnia, unspecified: Secondary | ICD-10-CM

## 2013-07-03 DIAGNOSIS — R1031 Right lower quadrant pain: Secondary | ICD-10-CM

## 2013-07-03 DIAGNOSIS — N318 Other neuromuscular dysfunction of bladder: Secondary | ICD-10-CM

## 2013-07-03 DIAGNOSIS — G8929 Other chronic pain: Secondary | ICD-10-CM

## 2013-07-03 DIAGNOSIS — R609 Edema, unspecified: Secondary | ICD-10-CM

## 2013-07-03 DIAGNOSIS — E785 Hyperlipidemia, unspecified: Secondary | ICD-10-CM

## 2013-07-03 DIAGNOSIS — R600 Localized edema: Secondary | ICD-10-CM

## 2013-07-03 DIAGNOSIS — I10 Essential (primary) hypertension: Secondary | ICD-10-CM

## 2013-07-03 DIAGNOSIS — G890 Central pain syndrome: Secondary | ICD-10-CM

## 2013-07-03 DIAGNOSIS — I6350 Cerebral infarction due to unspecified occlusion or stenosis of unspecified cerebral artery: Secondary | ICD-10-CM

## 2013-07-03 DIAGNOSIS — E039 Hypothyroidism, unspecified: Secondary | ICD-10-CM

## 2013-07-03 MED ORDER — LEVOTHYROXINE SODIUM 25 MCG PO TABS: ORAL_TABLET | ORAL | Status: DC

## 2013-07-03 MED ORDER — FESOTERODINE FUMARATE ER 4 MG PO TB24: 4.0000 mg | ORAL_TABLET | Freq: Every day | ORAL | Status: DC

## 2013-07-03 MED ORDER — HYDROCODONE-ACETAMINOPHEN 10-325 MG PO TABS: 1.0000 | ORAL_TABLET | Freq: Three times a day (TID) | ORAL | Status: DC | PRN

## 2013-07-03 MED ORDER — CITALOPRAM HYDROBROMIDE 40 MG PO TABS: 40.0000 mg | ORAL_TABLET | Freq: Every day | ORAL | Status: DC

## 2013-07-03 MED ORDER — SIMVASTATIN 20 MG PO TABS: 20.0000 mg | ORAL_TABLET | Freq: Every day | ORAL | Status: DC

## 2013-07-03 MED ORDER — AMLODIPINE BESYLATE 5 MG PO TABS: 5.0000 mg | ORAL_TABLET | Freq: Every day | ORAL | Status: DC

## 2013-07-03 MED ORDER — BACLOFEN 20 MG PO TABS: ORAL_TABLET | ORAL | Status: DC

## 2013-07-03 MED ORDER — ZOLPIDEM TARTRATE 10 MG PO TABS: 10.0000 mg | ORAL_TABLET | Freq: Every evening | ORAL | Status: DC | PRN

## 2013-07-03 NOTE — Progress Notes (Signed)
Patient ID: Allison Parker, female   DOB: [birth year], 72 y.o.   MRN: [id number] SUBJECTIVE: CC: Chief Complaint  Patient presents with  . Follow-up    4 month follow  up . c/o rt foot swelling  thinks it coming from stroke  . Medication Refill    needs refills     HPI: Patient is here for follow up of hyperlipidemia/HTN/CVA: denies Headache;denies Chest Pain;denies weakness;denies Shortness of Breath and orthopnea;denies Visual changes;denies palpitations;denies cough;denies pedal edema;denies symptoms of TIA or stroke;deniesClaudication symptoms. admits to Compliance with medications; denies Problems with medications.  Seen by urologist: put on Toviaz. No stones. Urine is better.  By the end of the day the right leg swells and is sore from the swelling. The swelling goes down by night in bed. Was evaluated by Dr Turner Daniels before. Has an orthopedic boot to use in the day. Has a DM sock to wear. Leg starting to swell already since she is up. Never Rx compression stockings.   Past Medical History  Diagnosis Date  . Hyperlipidemia   . Stroke   . Thyroid disease     hypothyroid  . Hypertension    No past surgical history on file. History   Social History  . Marital Status: Married    Spouse Name: N/A    Number of Children: N/A  . Years of Education: N/A   Occupational History  . Not on file.   Social History Main Topics  . Smoking status: Never Smoker   . Smokeless tobacco: Not on file  . Alcohol Use: Not on file  . Drug Use: Not on file  . Sexual Activity: Not on file   Other Topics Concern  . Not on file   Social History Narrative  . No narrative on file   No family history on file. Current Outpatient Prescriptions on File Prior to Visit  Medication Sig Dispense Refill  . amLODipine (NORVASC) 5 MG tablet Take 5 mg by mouth daily.      . baclofen (LIORESAL) 20 MG tablet TAKE ONE TABLET BY MOUTH TWICE DAILY  60 tablet  2  . Calcium Carbonate-Vitamin D (SM CALCIUM  500/VITAMIN D3 PO) Take by mouth.      . Cholecalciferol (VITAMIN D-1000 MAX ST PO) Take 0.5 tablets by mouth daily.      . citalopram (CELEXA) 40 MG tablet Take 40 mg by mouth daily.      . Coenzyme Q10 (CO Q-10) 300 MG CAPS Take 1 capsule by mouth daily.      Marland Kitchen HYDROcodone-acetaminophen (NORCO) 10-325 MG per tablet Take 1 tablet by mouth every 8 (eight) hours as needed for pain.  90 tablet  0  . levothyroxine (SYNTHROID, LEVOTHROID) 25 MCG tablet TAKE ONE TABLET BY MOUTH ONE TIME DAILY  30 tablet  9  . multivitamin-lutein (OCUVITE-LUTEIN) CAPS Take 1 capsule by mouth daily.      . simvastatin (ZOCOR) 20 MG tablet Take 1 tablet (20 mg total) by mouth daily.  30 tablet  5  . zolpidem (AMBIEN) 10 MG tablet Take 1 tablet (10 mg total) by mouth at bedtime as needed for sleep.  30 tablet  1   No current facility-administered medications on file prior to visit.   Allergies  Allergen Reactions  . Ace Inhibitors     There is no immunization history on file for this patient. Prior to Admission medications   Medication Sig Start Date End Date Taking? Authorizing Provider  amLODipine (NORVASC) 5 MG tablet  Take 5 mg by mouth daily.   Yes Historical Provider, MD  baclofen (LIORESAL) 20 MG tablet TAKE ONE TABLET BY MOUTH TWICE DAILY 06/27/13  Yes Ileana Ladd, MD  Calcium Carbonate-Vitamin D (SM CALCIUM 500/VITAMIN D3 PO) Take by mouth.   Yes Historical Provider, MD  Cholecalciferol (VITAMIN D-1000 MAX ST PO) Take 0.5 tablets by mouth daily.   Yes Historical Provider, MD  citalopram (CELEXA) 40 MG tablet Take 40 mg by mouth daily.   Yes Historical Provider, MD  Coenzyme Q10 (CO Q-10) 300 MG CAPS Take 1 capsule by mouth daily.   Yes Historical Provider, MD  fesoterodine (TOVIAZ) 4 MG TB24 tablet Take 4 mg by mouth daily. DR [facility]   Yes Historical Provider, MD  HYDROcodone-acetaminophen (NORCO) 10-325 MG per tablet Take 1 tablet by mouth every 8 (eight) hours as needed for pain. 06/19/13  Yes Ileana Ladd, MD  levothyroxine (SYNTHROID, LEVOTHROID) 25 MCG tablet TAKE ONE TABLET BY MOUTH ONE TIME DAILY 03/26/13  Yes Ileana Ladd, MD  multivitamin-lutein [facility]) CAPS Take 1 capsule by mouth daily.   Yes Historical Provider, MD  simvastatin (ZOCOR) 20 MG tablet Take 1 tablet (20 mg total) by mouth daily. 05/15/13 05/15/14 Yes Michelle Bozovich, RPH  zolpidem (AMBIEN) 10 MG tablet Take 1 tablet (10 mg total) by mouth at bedtime as needed for sleep. 02/27/13  Yes Ileana Ladd, MD      ROS: As above in the HPI. All other systems are stable or negative.  OBJECTIVE: APPEARANCE:  Patient in no acute distress.The patient appeared well nourished and normally developed. Acyanotic. Waist: VITAL SIGNS:BP 132/77  Pulse 86  Temp(Src) 98.8 F (37.1 C) (Oral)  Wt 190 lb 6.4 oz (86.365 kg)  BMI 31.68 kg/m2 WF Overweight/obese  SKIN: warm and  Dry without overt rashes, tattoos and scars  HEAD and Neck: without JVD, Head and scalp: normal Eyes:No scleral icterus. Fundi normal, eye movements normal. Ears: Auricle normal, canal normal, Tympanic membranes normal, insufflation normal. Nose: normal Throat: normal Neck & thyroid: normal  CHEST & LUNGS: Chest wall: normal Lungs: Clear  CVS: Reveals the PMI to be normally located. Regular rhythm, First and Second Heart sounds are normal,  absence of murmurs, rubs or gallops. Peripheral vasculature: Radial pulses: normal Dorsal pedis pulses: normal Posterior pulses: normal  ABDOMEN:  Appearance: obese Benign, no organomegaly, no masses, no Abdominal Aortic enlargement. No Guarding , no rebound. No Bruits. Bowel sounds: normal  RECTAL: N/A GU: N/A  EXTREMETIES: 1 + edema right leg. Right toenails needs grooming.  MUSCULOSKELETAL:  Spine: reduced ROM due to chronic pain. Right hand with spastic deformities of the fingers and involuntary twitches, not new. Ambulates with a cane  NEUROLOGIC: oriented to time,place and person;  right hemi-Plegia.   Results for orders placed in visit on 02/27/13  URINE CULTURE      Result Value Range   Colony Count NO GROWTH     Organism ID, Bacteria NO GROWTH    COMPLETE METABOLIC PANEL WITH GFR      Result Value Range   Sodium 137  135 - 145 mEq/L   Potassium 4.5  3.5 - 5.3 mEq/L   Chloride 102  96 - 112 mEq/L   CO2 26  19 - 32 mEq/L   Glucose, Bld 87  70 - 99 mg/dL   BUN 14  6 - 23 mg/dL   Creat 2.13  0.86 - 5.78 mg/dL   Total Bilirubin 0.6  0.3 -  1.2 mg/dL   Alkaline Phosphatase 89  39 - 117 U/L   AST 18  0 - 37 U/L   ALT 16  0 - 35 U/L   Total Protein 6.8  6.0 - 8.3 g/dL   Albumin 4.3  3.5 - 5.2 g/dL   Calcium 9.5  8.4 - 16.1 mg/dL   GFR, Est African American >89     GFR, Est Non African American 89    NMR LIPOPROFILE WITH LIPIDS      Result Value Range   LDL Particle Number [id number] (*) <1000 nmol/L   LDL (calc) 66  <100 mg/dL   HDL-C 72  >=09 mg/dL   Triglycerides 97  <604 mg/dL   Cholesterol, Total 540  <200 mg/dL   HDL Particle Number 98.1  >=19.1 umol/L   Large HDL-P 14.5  >=4.8 umol/L   Large VLDL-P 2.3  <=2.7 nmol/L   Small LDL Particle Number [id number] (*) <=527 nmol/L   LDL Size 20.3 (*) >20.5 nm   HDL Size 9.5  >=9.2 nm   VLDL Size 46.8 (*) <=46.6 nm   LP-IR Score 36  <=45  TSH      Result Value Range   TSH 1.228  0.350 - 4.500 uIU/mL  POCT UA - MICROSCOPIC ONLY      Result Value Range   WBC, Ur, HPF, POC neg     RBC, urine, microscopic 1-2     Bacteria, U Microscopic occ     Mucus, UA trace     Epithelial cells, urine per micros occ     Crystals, Ur, HPF, POC neg     Casts, Ur, LPF, POC neg     Yeast, UA neg    POCT URINALYSIS DIPSTICK      Result Value Range   Color, UA yellow     Clarity, UA clear     Glucose, UA neg     Bilirubin, UA neg     Ketones, UA neg     Spec Grav, UA 1.020     Blood, UA trace     pH, UA 5.0     Protein, UA neg     Urobilinogen, UA negative     Nitrite, UA neg     Leukocytes, UA Negative       ASSESSMENT: NONSPECIFIC ABNORMAL RESULTS LIVR FUNCTION STUDY  Frequency of micturition - Plan: fesoterodine (TOVIAZ) 4 MG TB24 tablet  Essential hypertension, benign - Plan: amLODipine (NORVASC) 5 MG tablet  DYSLIPIDEMIA  CVA - Plan: baclofen (LIORESAL) 20 MG tablet, Ambulatory referral to Podiatry  Abdominal pain, chronic, right lower quadrant  Pedal edema  Overactive bladder  Insomnia - Plan: zolpidem (AMBIEN) 10 MG tablet, citalopram (CELEXA) 40 MG tablet  Thalamic pain syndrome - Plan: HYDROcodone-acetaminophen (NORCO) 10-325 MG per tablet  Spinal stenosis - Plan: HYDROcodone-acetaminophen (NORCO) 10-325 MG per tablet  Other and unspecified hyperlipidemia - Plan: simvastatin (ZOCOR) 20 MG tablet, CMP14+EGFR, NMR, lipoprofile  Hypothyroid - Plan: levothyroxine (SYNTHROID, LEVOTHROID) 25 MCG tablet, TSH   PLAN: Orders Placed This Encounter  Procedures  . CMP14+EGFR  . NMR, lipoprofile  . TSH  . Ambulatory referral to Podiatry    Referral Priority:  Routine    Referral Type:  Consultation    Referral Reason:  Specialty Services Required    Requested Specialty:  Podiatry    Number of Visits Requested:  1    Meds ordered this encounter  Medications  . DISCONTD: fesoterodine (TOVIAZ) 4 MG TB24 tablet  Sig: Take 4 mg by mouth daily. DR Annabell Howells  . fesoterodine (TOVIAZ) 4 MG TB24 tablet    Sig: Take 1 tablet (4 mg total) by mouth daily. DR [facility]    Dispense:  30 tablet    Refill:  11  . HYDROcodone-acetaminophen (NORCO) 10-325 MG per tablet    Sig: Take 1 tablet by mouth every 8 (eight) hours as needed for pain.    Dispense:  90 tablet    Refill:  0  . zolpidem (AMBIEN) 10 MG tablet    Sig: Take 1 tablet (10 mg total) by mouth at bedtime as needed for sleep.    Dispense:  30 tablet    Refill:  1  . baclofen (LIORESAL) 20 MG tablet    Sig: TAKE ONE TABLET BY MOUTH TWICE DAILY    Dispense:  60 tablet    Refill:  5  . simvastatin (ZOCOR) 20 MG tablet     Sig: Take 1 tablet (20 mg total) by mouth daily.    Dispense:  30 tablet    Refill:  11  . levothyroxine (SYNTHROID, LEVOTHROID) 25 MCG tablet    Sig: TAKE ONE TABLET BY MOUTH ONE TIME DAILY    Dispense:  30 tablet    Refill:  11  . amLODipine (NORVASC) 5 MG tablet    Sig: Take 1 tablet (5 mg total) by mouth daily.    Dispense:  30 tablet    Refill:  11  . citalopram (CELEXA) 40 MG tablet    Sig: Take 1 tablet (40 mg total) by mouth daily.    Dispense:  30 tablet    Refill:  5  referral to podiatry for foot and nail care. Continue present treatment. Keep active. Reviewed diet. She tries to eat healthy but there is too little vegetables in her diet which would help with weight control.  Return in about 4 months (around 11/02/2013) for Recheck medical problems.  Kesler Wickham P. Modesto Charon, M.D.

## 2013-07-05 ENCOUNTER — Other Ambulatory Visit: Payer: Self-pay | Admitting: Family Medicine

## 2013-07-05 DIAGNOSIS — E785 Hyperlipidemia, unspecified: Secondary | ICD-10-CM

## 2013-07-05 LAB — CMP14+EGFR
ALT: 14 IU/L (ref 0–32)
AST: 20 IU/L (ref 0–40)
Albumin/Globulin Ratio: 2.2 (ref 1.1–2.5)
Albumin: 4.8 g/dL (ref 3.6–4.8)
Alkaline Phosphatase: 93 IU/L (ref 39–117)
BUN/Creatinine Ratio: 21 (ref 11–26)
BUN: 15 mg/dL (ref 8–27)
CO2: 28 mmol/L (ref 18–29)
Calcium: 9.8 mg/dL (ref 8.6–10.2)
Chloride: 93 mmol/L — ABNORMAL LOW (ref 97–108)
Creatinine, Ser: 0.7 mg/dL (ref 0.57–1.00)
GFR calc Af Amer: 102 mL/min/{1.73_m2} (ref >59)
GFR calc non Af Amer: 89 mL/min/{1.73_m2} (ref >59)
Globulin, Total: 2.2 g/dL (ref 1.5–4.5)
Glucose: 85 mg/dL (ref 65–99)
Potassium: 4.8 mmol/L (ref 3.5–5.2)
Sodium: 135 mmol/L (ref 134–144)
Total Bilirubin: 0.4 mg/dL (ref 0.0–1.2)
Total Protein: 7 g/dL (ref 6.0–8.5)

## 2013-07-05 LAB — NMR, LIPOPROFILE
Cholesterol: 185 mg/dL (ref <200)
HDL Cholesterol by NMR: 74 mg/dL (ref >=40)
HDL Particle Number: 54.7 umol/L (ref >=30.5)
LDL Particle Number: 1561 nmol/L — ABNORMAL HIGH (ref <1000)
LDL Size: 20.8 nm (ref >20.5)
LDLC SERPL CALC-MCNC: 84 mg/dL (ref <100)
LP-IR Score: 47 — ABNORMAL HIGH (ref <=45)
Small LDL Particle Number: 545 nmol/L — ABNORMAL HIGH (ref <=527)
Triglycerides by NMR: 136 mg/dL (ref <150)

## 2013-07-05 LAB — TSH: TSH: 1.86 u[IU]/mL (ref 0.450–4.500)

## 2013-07-05 MED ORDER — ATORVASTATIN CALCIUM 20 MG PO TABS: 20.0000 mg | ORAL_TABLET | Freq: Every day | ORAL | Status: DC

## 2013-08-14 ENCOUNTER — Ambulatory Visit (INDEPENDENT_AMBULATORY_CARE_PROVIDER_SITE_OTHER): Payer: Medicare FFS | Admitting: Pharmacist Clinician (PhC)/ Clinical Pharmacy Specialist

## 2013-08-14 DIAGNOSIS — M4800 Spinal stenosis, site unspecified: Secondary | ICD-10-CM

## 2013-08-14 DIAGNOSIS — G890 Central pain syndrome: Secondary | ICD-10-CM

## 2013-08-14 DIAGNOSIS — Z23 Encounter for immunization: Secondary | ICD-10-CM

## 2013-08-14 MED ORDER — HYDROCODONE-ACETAMINOPHEN 10-325 MG PO TABS: 1.0000 | ORAL_TABLET | Freq: Three times a day (TID) | ORAL | Status: DC | PRN

## 2013-08-14 NOTE — Progress Notes (Signed)
Allison Parker comes in today for follow up of chronic pain management.  See past notes for a detailed review of her pain.  Last month patient had hydrocodone-APAP decreased from four tablets a day to three tablets a day due to concerns over her hepatic function.  Patient states she has time during the day when her pain is moderate to severe with the decrease in hydrocodone.  She has tried taking two aleeve with limited success.  Patient states that lyrica and gabapentin made her feel drunk when she tried to take them.  She will continue to take hydrocodone-APAP three times a day until her next visit with Dr. Modesto Charon.  Patient is due for her prescription today and a written one was given to her for #90 and signed by Dr. Christell Constant.  Discussed with patient other options to help with pain control outside of medications:  Heat, PT, tens unit, etc.

## 2013-10-13 ENCOUNTER — Other Ambulatory Visit: Payer: Self-pay | Admitting: Family Medicine

## 2013-10-13 DIAGNOSIS — M4800 Spinal stenosis, site unspecified: Secondary | ICD-10-CM

## 2013-10-13 DIAGNOSIS — G890 Central pain syndrome: Secondary | ICD-10-CM

## 2013-10-15 MED ORDER — HYDROCODONE-ACETAMINOPHEN 10-325 MG PO TABS: 1.0000 | ORAL_TABLET | Freq: Three times a day (TID) | ORAL | Status: DC | PRN

## 2013-10-15 NOTE — Telephone Encounter (Signed)
rx for hydrocodone/APAP 10/325mg  printed and at front desk.

## 2013-10-15 NOTE — Telephone Encounter (Signed)
Patient called to let know Rx for hydrocodone/ APAP at front desk

## 2013-10-15 NOTE — Telephone Encounter (Signed)
Call patient : Prescription refilled & sent to pharmacy in [place]. 

## 2013-11-06 ENCOUNTER — Encounter: Payer: Self-pay | Admitting: Family Medicine

## 2013-11-06 ENCOUNTER — Ambulatory Visit (INDEPENDENT_AMBULATORY_CARE_PROVIDER_SITE_OTHER): Payer: Medicare FFS | Admitting: Family Medicine

## 2013-11-06 VITALS — BP 127/77 | HR 87 | Temp 98.2°F | Ht 65.0 in | Wt 193.8 lb

## 2013-11-06 DIAGNOSIS — I10 Essential (primary) hypertension: Secondary | ICD-10-CM

## 2013-11-06 DIAGNOSIS — G4700 Insomnia, unspecified: Secondary | ICD-10-CM

## 2013-11-06 DIAGNOSIS — E785 Hyperlipidemia, unspecified: Secondary | ICD-10-CM

## 2013-11-06 DIAGNOSIS — R350 Frequency of micturition: Secondary | ICD-10-CM

## 2013-11-06 DIAGNOSIS — G890 Central pain syndrome: Secondary | ICD-10-CM

## 2013-11-06 DIAGNOSIS — R945 Abnormal results of liver function studies: Secondary | ICD-10-CM

## 2013-11-06 DIAGNOSIS — N3281 Overactive bladder: Secondary | ICD-10-CM

## 2013-11-06 DIAGNOSIS — M4800 Spinal stenosis, site unspecified: Secondary | ICD-10-CM

## 2013-11-06 DIAGNOSIS — R600 Localized edema: Secondary | ICD-10-CM

## 2013-11-06 DIAGNOSIS — R609 Edema, unspecified: Secondary | ICD-10-CM

## 2013-11-06 DIAGNOSIS — N318 Other neuromuscular dysfunction of bladder: Secondary | ICD-10-CM

## 2013-11-06 DIAGNOSIS — I6350 Cerebral infarction due to unspecified occlusion or stenosis of unspecified cerebral artery: Secondary | ICD-10-CM

## 2013-11-06 MED ORDER — HYDROCODONE-ACETAMINOPHEN 10-325 MG PO TABS: 1.0000 | ORAL_TABLET | Freq: Three times a day (TID) | ORAL | Status: DC | PRN

## 2013-11-06 MED ORDER — BACLOFEN 20 MG PO TABS: 20.0000 mg | ORAL_TABLET | Freq: Two times a day (BID) | ORAL | Status: DC

## 2013-11-06 MED ORDER — SOLIFENACIN SUCCINATE 5 MG PO TABS: 5.0000 mg | ORAL_TABLET | Freq: Every day | ORAL | Status: DC

## 2013-11-06 NOTE — Progress Notes (Signed)
Patient ID: Quintella Baton Deschamps, female   DOB: [birth year], 73 y.o.   MRN: [id number] SUBJECTIVE: CC: Chief Complaint  Patient presents with  . Follow-up    4 month follow up chronic problems. refill baclofen and norco. insuracne will not cover toviaz or ambien     HPI:  Patient is here for follow up of hyperlipidemia/HTN/CVA/OAB: denies Headache;denies Chest Pain;denies weakness;denies Shortness of Breath and orthopnea;denies Visual changes;denies palpitations;denies cough;denies pedal edema;denies symptoms of TIA or stroke;deniesClaudication symptoms. admits to Compliance with medications; denies Problems with medications.  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  Past Medical History  Diagnosis Date  . Hyperlipidemia   . Stroke   . Thyroid disease     hypothyroid  . Hypertension   . Insomnia    No past surgical history on file. History   Social History  . Marital Status: Married    Spouse Name: N/A    Number of Children: N/A  . Years of Education: N/A   Occupational History  . Not on file.   Social History Main Topics  . Smoking status: Never Smoker   . Smokeless tobacco: Not on file  . Alcohol Use: Not on file  . Drug Use: Not on file  . Sexual Activity: Not on file   Other Topics Concern  . Not on file   Social History Narrative  . No narrative on file   No family history on file. Current Outpatient Prescriptions on File Prior to Visit  Medication Sig Dispense Refill  . amLODipine (NORVASC) 5 MG tablet Take 1 tablet (5 mg total) by mouth daily.  30 tablet  11  . atorvastatin (LIPITOR) 20 MG tablet Take 1 tablet (20 mg total) by mouth daily.  30 tablet  5  . Calcium Carbonate-Vitamin D (SM CALCIUM 500/VITAMIN D3 PO) Take by mouth.      . Cholecalciferol (VITAMIN D-1000 MAX ST PO) Take 0.5 tablets by mouth daily.      . citalopram (CELEXA) 40 MG tablet Take 1 tablet (40 mg total) by mouth daily.  30 tablet  5  . Coenzyme Q10 (CO Q-10) 300 MG CAPS Take 1 capsule  by mouth daily.      Marland Kitchen levothyroxine (SYNTHROID, LEVOTHROID) 25 MCG tablet TAKE ONE TABLET BY MOUTH ONE TIME DAILY  30 tablet  11  . multivitamin-lutein (OCUVITE-LUTEIN) CAPS Take 1 capsule by mouth daily.      Marland Kitchen zolpidem (AMBIEN) 10 MG tablet Take 1 tablet (10 mg total) by mouth at bedtime as needed for sleep.  30 tablet  1   No current facility-administered medications on file prior to visit.   Allergies  Allergen Reactions  . Ace Inhibitors    Immunization History  Administered Date(s) Administered  . Influenza,inj,Quad PF,36+ Mos 08/14/2013  . Pneumococcal Polysaccharide-23 08/07/2012  . Tdap 09/25/2007   Prior to Admission medications   Medication Sig Start Date End Date Taking? Authorizing Provider  amLODipine (NORVASC) 5 MG tablet Take 1 tablet (5 mg total) by mouth daily. 07/03/13  Yes Vernie Shanks, MD  atorvastatin (LIPITOR) 20 MG tablet Take 1 tablet (20 mg total) by mouth daily. 07/05/13  Yes Vernie Shanks, MD  baclofen (LIORESAL) 20 MG tablet Take 1 tablet (20 mg total) by mouth 2 (two) times daily. Prn spasms 10/13/13  Yes Vernie Shanks, MD  Calcium Carbonate-Vitamin D (SM CALCIUM 500/VITAMIN D3 PO) Take by mouth.   Yes Historical Provider, MD  Cholecalciferol (VITAMIN D-1000 MAX ST PO) Take 0.5 tablets  by mouth daily.   Yes Historical Provider, MD  citalopram (CELEXA) 40 MG tablet Take 1 tablet (40 mg total) by mouth daily. 07/03/13  Yes Vernie Shanks, MD  Coenzyme Q10 (CO Q-10) 300 MG CAPS Take 1 capsule by mouth daily.   Yes Historical Provider, MD  fesoterodine (TOVIAZ) 4 MG TB24 tablet Take 1 tablet (4 mg total) by mouth daily. DR Jeffie Pollock 07/03/13  Yes Vernie Shanks, MD  HYDROcodone-acetaminophen (NORCO) 10-325 MG per tablet Take 1 tablet by mouth every 8 (eight) hours as needed. 10/15/13  Yes Vernie Shanks, MD  levothyroxine (SYNTHROID, LEVOTHROID) 25 MCG tablet TAKE ONE TABLET BY MOUTH ONE TIME DAILY 07/03/13  Yes Vernie Shanks, MD  multivitamin-lutein [facility])  CAPS Take 1 capsule by mouth daily.   Yes Historical Provider, MD  zolpidem (AMBIEN) 10 MG tablet Take 1 tablet (10 mg total) by mouth at bedtime as needed for sleep. 07/03/13  Yes Vernie Shanks, MD     ROS: As above in the HPI. All other systems are stable or negative.  OBJECTIVE: APPEARANCE:  Patient in no acute distress.The patient appeared well nourished and normally developed. Acyanotic. Waist: VITAL SIGNS:BP 127/77  Pulse 87  Temp(Src) 98.2 F (36.8 C) (Oral)  Ht 5' 5"  (1.651 m)  Wt 193 lb 12.8 oz (87.907 kg)  BMI 32.25 kg/m2 WM obese  SKIN: warm and  Dry without overt rashes, tattoos and scars  HEAD and Neck: without JVD, Head and scalp: normal Eyes:No scleral icterus. Fundi normal, eye movements normal. Ears: Auricle normal, canal normal, Tympanic membranes normal, insufflation normal. Nose: normal Throat: normal Neck & thyroid: normal  CHEST & LUNGS: Chest wall: normal Lungs: Clear  CVS: Reveals the PMI to be normally located. Regular rhythm, First and Second Heart sounds are normal,  absence of murmurs, rubs or gallops. Peripheral vasculature: Radial pulses: normal Dorsal pedis pulses: normal Posterior pulses: normal  ABDOMEN:  Appearance: Obese Benign, no organomegaly, no masses, no Abdominal Aortic enlargement. No Guarding , no rebound. No Bruits. Bowel sounds: normal  RECTAL: N/A GU: N/A  EXTREMETIES: nonedematous.  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 Right ankle  swelling  Results for orders placed in visit on 07/03/13  CMP14+EGFR      Result Value Range   Glucose 85  65 - 99 mg/dL   BUN 15  8 - 27 mg/dL   Creatinine, Ser 0.70  0.57 - 1.00 mg/dL   GFR calc non Af Amer 89  >59 mL/min/1.73   GFR calc Af Amer 102  >59 mL/min/1.73   BUN/Creatinine Ratio 21  11 - 26   Sodium 135  134 - 144  mmol/L   Potassium 4.8  3.5 - 5.2 mmol/L   Chloride 93 (*) 97 - 108 mmol/L   CO2 28  18 - 29 mmol/L   Calcium 9.8  8.6 - 10.2 mg/dL   Total Protein 7.0  6.0 - 8.5 g/dL   Albumin 4.8  3.6 - 4.8 g/dL   Globulin, Total 2.2  1.5 - 4.5 g/dL   Albumin/Globulin Ratio 2.2  1.1 - 2.5   Total Bilirubin 0.4  0.0 - 1.2 mg/dL   Alkaline Phosphatase 93  39 - 117 IU/L   AST 20  0 - 40 IU/L   ALT 14  0 - 32 IU/L  NMR, LIPOPROFILE  Result Value Range   LDL Particle Number [id number] (*) <1000 nmol/L   LDLC SERPL CALC-MCNC 84  <100 mg/dL   HDL Cholesterol by NMR 74  >=40 mg/dL   Triglycerides by NMR 136  <150 mg/dL   Cholesterol 185  <200 mg/dL   HDL Particle Number 54.7  >=30.5 umol/L   Small LDL Particle Number 545 (*) <=527 nmol/L   LDL Size 20.8  >20.5 nm   LP-IR Score 47 (*) <=45  TSH      Result Value Range   TSH 1.860  0.450 - 4.500 uIU/mL    ASSESSMENT:  CVA - Plan: baclofen (LIORESAL) 20 MG tablet  Essential hypertension, benign - Plan: CMP14+EGFR  DYSLIPIDEMIA - Plan: CMP14+EGFR, Lipid panel  Frequency of micturition  Insomnia  NONSPECIFIC ABNORMAL RESULTS LIVR FUNCTION STUDY  Overactive bladder - Plan: solifenacin (VESICARE) 5 MG tablet  Pedal edema  Thalamic pain syndrome - Plan: HYDROcodone-acetaminophen (NORCO) 10-325 MG per tablet  Spinal stenosis - Plan: HYDROcodone-acetaminophen (NORCO) 10-325 MG per tablet  PLAN:      Dr Paula Libra Recommendations  For nutrition information, I recommend books:  1).Eat to Live by Dr Excell Seltzer. 2).Prevent and Reverse Heart Disease by Dr Karl Luke. 3) Dr Janene Harvey Book:  Program to Reverse Diabetes  Exercise recommendations are:  If unable to walk, then the patient can exercise in a chair 3 times a day. By flapping arms like a bird gently and raising legs outwards to the front.  If ambulatory, the patient can go for walks for 30 minutes 3 times a week. Then increase the intensity and duration as  tolerated.  Goal is to try to attain exercise frequency to 5 times a week.  If applicable: Best to perform resistance exercises (machines or weights) 2 days a week and cardio type exercises 3 days per week.  Orders Placed This Encounter  Procedures  . CMP14+EGFR  . Lipid panel   Meds ordered this encounter  Medications  . solifenacin (VESICARE) 5 MG tablet    Sig: Take 1 tablet (5 mg total) by mouth daily.    Dispense:  90 tablet    Refill:  3  . baclofen (LIORESAL) 20 MG tablet    Sig: Take 1 tablet (20 mg total) by mouth 2 (two) times daily. Prn spasms    Dispense:  60 each    Refill:  5  . HYDROcodone-acetaminophen (NORCO) 10-325 MG per tablet    Sig: Take 1 tablet by mouth every 8 (eight) hours as needed.    Dispense:  90 tablet    Refill:  0   Medications Discontinued During This Encounter  Medication Reason  . baclofen (LIORESAL) 20 MG tablet Duplicate  . fesoterodine (TOVIAZ) 4 MG TB24 tablet Formulary change  . baclofen (LIORESAL) 20 MG tablet Reorder  . HYDROcodone-acetaminophen (NORCO) 10-325 MG per tablet Reorder   Return in about 4 months (around 03/06/2014) for Recheck medical problems.  Tiarra Anastacio P. Jacelyn Grip, M.D.

## 2013-11-06 NOTE — Patient Instructions (Signed)
      Dr Daphene Chisholm's Recommendations  For nutrition information, I recommend books:  1).Eat to Live by Dr Joel Fuhrman. 2).Prevent and Reverse Heart Disease by Dr Caldwell Esselstyn. 3) Dr Neal Barnard's Book:  Program to Reverse Diabetes  Exercise recommendations are:  If unable to walk, then the patient can exercise in a chair 3 times a day. By flapping arms like a bird gently and raising legs outwards to the front.  If ambulatory, the patient can go for walks for 30 minutes 3 times a week. Then increase the intensity and duration as tolerated.  Goal is to try to attain exercise frequency to 5 times a week.  If applicable: Best to perform resistance exercises (machines or weights) 2 days a week and cardio type exercises 3 days per week.  

## 2013-11-07 LAB — CMP14+EGFR
ALT: 16 IU/L (ref 0–32)
AST: 17 IU/L (ref 0–40)
Albumin/Globulin Ratio: 2.1 (ref 1.1–2.5)
Albumin: 4.5 g/dL (ref 3.5–4.8)
Alkaline Phosphatase: 120 IU/L — ABNORMAL HIGH (ref 39–117)
BUN/Creatinine Ratio: 21 (ref 11–26)
BUN: 16 mg/dL (ref 8–27)
CO2: 25 mmol/L (ref 18–29)
Calcium: 9.8 mg/dL (ref 8.6–10.2)
Chloride: 94 mmol/L — ABNORMAL LOW (ref 97–108)
Creatinine, Ser: 0.75 mg/dL (ref 0.57–1.00)
GFR calc Af Amer: 93 mL/min/{1.73_m2} (ref >59)
GFR calc non Af Amer: 81 mL/min/{1.73_m2} (ref >59)
Globulin, Total: 2.1 g/dL (ref 1.5–4.5)
Glucose: 91 mg/dL (ref 65–99)
Potassium: 4.6 mmol/L (ref 3.5–5.2)
Sodium: 136 mmol/L (ref 134–144)
Total Bilirubin: 0.6 mg/dL (ref 0.0–1.2)
Total Protein: 6.6 g/dL (ref 6.0–8.5)

## 2013-11-07 LAB — LIPID PANEL
Chol/HDL Ratio: 2.1 ratio units (ref 0.0–4.4)
Cholesterol, Total: 171 mg/dL (ref 100–199)
HDL: 81 mg/dL (ref >39)
LDL Calculated: 68 mg/dL (ref 0–99)
Triglycerides: 110 mg/dL (ref 0–149)
VLDL Cholesterol Cal: 22 mg/dL (ref 5–40)

## 2013-12-17 ENCOUNTER — Telehealth: Payer: Self-pay | Admitting: Pharmacist

## 2013-12-17 ENCOUNTER — Other Ambulatory Visit: Payer: Self-pay | Admitting: Family Medicine

## 2013-12-17 DIAGNOSIS — M4800 Spinal stenosis, site unspecified: Secondary | ICD-10-CM

## 2013-12-17 DIAGNOSIS — G890 Central pain syndrome: Secondary | ICD-10-CM

## 2013-12-17 MED ORDER — HYDROCODONE-ACETAMINOPHEN 10-325 MG PO TABS: 1.0000 | ORAL_TABLET | Freq: Three times a day (TID) | ORAL | Status: DC | PRN

## 2013-12-17 NOTE — Telephone Encounter (Signed)
Rx ready for pick up. 

## 2013-12-17 NOTE — Telephone Encounter (Signed)
Patient aware.

## 2013-12-23 LAB — HM MAMMOGRAPHY

## 2014-01-02 ENCOUNTER — Other Ambulatory Visit: Payer: Self-pay | Admitting: Family Medicine

## 2014-01-14 ENCOUNTER — Telehealth: Payer: Self-pay | Admitting: Pharmacist

## 2014-01-15 ENCOUNTER — Other Ambulatory Visit: Payer: Self-pay | Admitting: Family Medicine

## 2014-01-15 DIAGNOSIS — G890 Central pain syndrome: Secondary | ICD-10-CM

## 2014-01-15 DIAGNOSIS — M4800 Spinal stenosis, site unspecified: Secondary | ICD-10-CM

## 2014-01-15 MED ORDER — HYDROCODONE-ACETAMINOPHEN 10-325 MG PO TABS: 1.0000 | ORAL_TABLET | Freq: Three times a day (TID) | ORAL | Status: DC | PRN

## 2014-01-15 NOTE — Telephone Encounter (Signed)
Pt notified to pick up rx at front office

## 2014-01-15 NOTE — Telephone Encounter (Signed)
Rx ready for pick up. 

## 2014-01-29 ENCOUNTER — Encounter: Payer: Self-pay | Admitting: *Deleted

## 2014-02-04 ENCOUNTER — Other Ambulatory Visit: Payer: Self-pay | Admitting: Family Medicine

## 2014-02-13 ENCOUNTER — Telehealth: Payer: Self-pay | Admitting: Pharmacist

## 2014-02-15 ENCOUNTER — Other Ambulatory Visit: Payer: Self-pay | Admitting: Family Medicine

## 2014-02-15 DIAGNOSIS — M4800 Spinal stenosis, site unspecified: Secondary | ICD-10-CM

## 2014-02-15 DIAGNOSIS — G890 Central pain syndrome: Secondary | ICD-10-CM

## 2014-02-15 MED ORDER — HYDROCODONE-ACETAMINOPHEN 10-325 MG PO TABS: 1.0000 | ORAL_TABLET | Freq: Three times a day (TID) | ORAL | Status: DC | PRN

## 2014-02-15 NOTE — Telephone Encounter (Signed)
Rx ready for pick up. 

## 2014-02-15 NOTE — Telephone Encounter (Signed)
Pt aware to pick up rx for hydrocodone at front desk

## 2014-03-06 ENCOUNTER — Ambulatory Visit: Payer: Medicare FFS | Admitting: Family Medicine

## 2014-03-07 ENCOUNTER — Other Ambulatory Visit: Payer: Self-pay

## 2014-03-07 ENCOUNTER — Ambulatory Visit: Payer: Medicare FFS | Admitting: Family Medicine

## 2014-03-07 MED ORDER — BACLOFEN 20 MG PO TABS: ORAL_TABLET | ORAL | Status: DC

## 2014-03-11 ENCOUNTER — Encounter: Payer: Self-pay | Admitting: Family Medicine

## 2014-03-11 ENCOUNTER — Ambulatory Visit (INDEPENDENT_AMBULATORY_CARE_PROVIDER_SITE_OTHER): Payer: Medicare FFS | Admitting: Family Medicine

## 2014-03-11 VITALS — BP 134/71 | HR 80 | Temp 98.3°F | Ht 65.0 in | Wt 196.0 lb

## 2014-03-11 DIAGNOSIS — M4800 Spinal stenosis, site unspecified: Secondary | ICD-10-CM

## 2014-03-11 DIAGNOSIS — R5383 Other fatigue: Secondary | ICD-10-CM

## 2014-03-11 DIAGNOSIS — G890 Central pain syndrome: Secondary | ICD-10-CM

## 2014-03-11 DIAGNOSIS — R5381 Other malaise: Secondary | ICD-10-CM

## 2014-03-11 DIAGNOSIS — I10 Essential (primary) hypertension: Secondary | ICD-10-CM

## 2014-03-11 DIAGNOSIS — E785 Hyperlipidemia, unspecified: Secondary | ICD-10-CM

## 2014-03-11 LAB — POCT CBC
Granulocyte percent: 53.7 %G (ref 37–80)
HCT, POC: 38.4 % (ref 37.7–47.9)
Hemoglobin: 12.6 g/dL (ref 12.2–16.2)
Lymph, poc: 1.7 (ref 0.6–3.4)
MCH, POC: 29.1 pg (ref 27–31.2)
MCHC: 32.8 g/dL (ref 31.8–35.4)
MCV: 88.8 fL (ref 80–97)
MPV: 7.8 fL (ref 0–99.8)
POC Granulocyte: 2.6 (ref 2–6.9)
POC LYMPH PERCENT: 36.3 %L (ref 10–50)
Platelet Count, POC: 227 10*3/uL (ref 142–424)
RBC: 4.3 M/uL (ref 4.04–5.48)
RDW, POC: 13 %
WBC: 4.8 10*3/uL (ref 4.6–10.2)

## 2014-03-11 MED ORDER — HYDROCODONE-ACETAMINOPHEN 10-325 MG PO TABS: 1.0000 | ORAL_TABLET | Freq: Three times a day (TID) | ORAL | Status: DC | PRN

## 2014-03-11 MED ORDER — BACLOFEN 20 MG PO TABS: ORAL_TABLET | ORAL | Status: DC

## 2014-03-11 NOTE — Progress Notes (Signed)
   Subjective:    Patient ID: Quintella Baton Desrochers, female    DOB: May 27, 1941, 73 y.o.   MRN: [id number]  HPI This 73 y.o. 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  She is needing refills on her hydrocodone.  She is having a lot of pain in  Her right lower extremity.  She sees Dr. Brigid Re and sees podiatry.  She states she is intolerant to gabapentin and lyrica.  She was on carbidopa and had elevated transaminases according to patient.   Review of Systems C/o right lower extremity pain, and right upper extremity spasticity. No chest pain, SOB, HA, dizziness, vision change, N/V, diarrhea, constipation, dysuria, urinary urgency or frequency, myalgias, arthralgias or rash.     Objective:   Physical Exam  Vital signs noted  Well developed well nourished female.  HEENT - Head atraumatic Normocephalic                Eyes - PERRLA, Conjuctiva - clear Sclera- Clear EOMI                Ears - EAC's Wnl TM's Wnl Gross Hearing WNL                Throat - oropharanx wnl Respiratory - Lungs CTA bilateral Cardiac - RRR S1 and S2 without murmur GI - Abdomen soft Nontender and bowel sounds active x 4 Extremities - No edema. Neuro - Right upper extremity with spasticity and chorea form movements      Assessment & Plan:  Hypertension - Plan: POCT CBC, CMP14+EGFR  Fatigue - Plan: POCT CBC, CMP14+EGFR, Vit D  25 hydroxy (rtn osteoporosis monitoring)  Hyperlipemia - Plan: CMP14+EGFR, Lipid panel  Thalamic pain syndrome - Plan: baclofen (LIORESAL) 20 MG tablet, HYDROcodone-acetaminophen (NORCO) 10-325 MG per tablet  Spinal stenosis - Plan: HYDROcodone-acetaminophen (NORCO) 10-325 MG per tablet  Follow up in 3 months  Lysbeth Penner FNP

## 2014-03-12 ENCOUNTER — Telehealth: Payer: Self-pay | Admitting: Family Medicine

## 2014-03-12 LAB — CMP14+EGFR
ALT: 23 IU/L (ref 0–32)
AST: 23 IU/L (ref 0–40)
Albumin/Globulin Ratio: 2.2 (ref 1.1–2.5)
Albumin: 4.6 g/dL (ref 3.5–4.8)
Alkaline Phosphatase: 143 IU/L — ABNORMAL HIGH (ref 39–117)
BUN/Creatinine Ratio: 22 (ref 11–26)
BUN: 15 mg/dL (ref 8–27)
CO2: 25 mmol/L (ref 18–29)
Calcium: 9.5 mg/dL (ref 8.7–10.3)
Chloride: 97 mmol/L (ref 97–108)
Creatinine, Ser: 0.69 mg/dL (ref 0.57–1.00)
GFR calc Af Amer: 102 mL/min/{1.73_m2} (ref >59)
GFR calc non Af Amer: 88 mL/min/{1.73_m2} (ref >59)
Globulin, Total: 2.1 g/dL (ref 1.5–4.5)
Glucose: 91 mg/dL (ref 65–99)
Potassium: 4.7 mmol/L (ref 3.5–5.2)
Sodium: 137 mmol/L (ref 134–144)
Total Bilirubin: 0.4 mg/dL (ref 0.0–1.2)
Total Protein: 6.7 g/dL (ref 6.0–8.5)

## 2014-03-12 LAB — LIPID PANEL
Chol/HDL Ratio: 2.1 ratio units (ref 0.0–4.4)
Cholesterol, Total: 161 mg/dL (ref 100–199)
HDL: 77 mg/dL (ref >39)
LDL Calculated: 65 mg/dL (ref 0–99)
Triglycerides: 93 mg/dL (ref 0–149)
VLDL Cholesterol Cal: 19 mg/dL (ref 5–40)

## 2014-03-12 LAB — VITAMIN D 25 HYDROXY (VIT D DEFICIENCY, FRACTURES): Vit D, 25-Hydroxy: 34.7 ng/mL (ref 30.0–100.0)

## 2014-03-12 NOTE — Telephone Encounter (Signed)
Patient aware.

## 2014-04-09 ENCOUNTER — Other Ambulatory Visit: Payer: Self-pay | Admitting: *Deleted

## 2014-04-09 DIAGNOSIS — G4700 Insomnia, unspecified: Secondary | ICD-10-CM

## 2014-04-09 MED ORDER — CITALOPRAM HYDROBROMIDE 40 MG PO TABS: 40.0000 mg | ORAL_TABLET | Freq: Every day | ORAL | Status: DC

## 2014-04-16 ENCOUNTER — Other Ambulatory Visit: Payer: Self-pay | Admitting: Pharmacist Clinician (PhC)/ Clinical Pharmacy Specialist

## 2014-04-16 DIAGNOSIS — M4802 Spinal stenosis, cervical region: Secondary | ICD-10-CM

## 2014-04-16 DIAGNOSIS — G890 Central pain syndrome: Secondary | ICD-10-CM

## 2014-04-16 MED ORDER — HYDROCODONE-ACETAMINOPHEN 10-325 MG PO TABS: 1.0000 | ORAL_TABLET | Freq: Three times a day (TID) | ORAL | Status: DC | PRN

## 2014-04-22 ENCOUNTER — Telehealth: Payer: Self-pay | Admitting: Pharmacist

## 2014-04-22 NOTE — Telephone Encounter (Signed)
Patient left message asking about Rx for norco.  Rx was written and left at front desk 04/16/14.   I called patient but had to leave message - informed patient that Rx was at front desk for her to pick up.

## 2014-05-07 ENCOUNTER — Other Ambulatory Visit: Payer: Self-pay | Admitting: *Deleted

## 2014-05-07 MED ORDER — ATORVASTATIN CALCIUM 20 MG PO TABS: ORAL_TABLET | ORAL | Status: DC

## 2014-05-13 ENCOUNTER — Other Ambulatory Visit: Payer: Self-pay | Admitting: Pharmacist

## 2014-05-13 DIAGNOSIS — G890 Central pain syndrome: Secondary | ICD-10-CM

## 2014-05-13 DIAGNOSIS — M4802 Spinal stenosis, cervical region: Secondary | ICD-10-CM

## 2014-05-13 MED ORDER — HYDROCODONE-ACETAMINOPHEN 10-325 MG PO TABS: 1.0000 | ORAL_TABLET | Freq: Three times a day (TID) | ORAL | Status: DC | PRN

## 2014-05-13 NOTE — Telephone Encounter (Signed)
rx printed and signed by Stevan Born Patient aware Rx at front desk and that cannot fill until 05/15/14 or after.

## 2014-06-11 ENCOUNTER — Telehealth: Payer: Self-pay | Admitting: *Deleted

## 2014-06-11 ENCOUNTER — Ambulatory Visit (INDEPENDENT_AMBULATORY_CARE_PROVIDER_SITE_OTHER): Payer: Medicare FFS

## 2014-06-11 ENCOUNTER — Encounter: Payer: Self-pay | Admitting: Nurse Practitioner

## 2014-06-11 ENCOUNTER — Ambulatory Visit (INDEPENDENT_AMBULATORY_CARE_PROVIDER_SITE_OTHER): Payer: Medicare FFS | Admitting: Nurse Practitioner

## 2014-06-11 VITALS — BP 134/76 | HR 75 | Ht 65.0 in | Wt 196.0 lb

## 2014-06-11 DIAGNOSIS — S99929A Unspecified injury of unspecified foot, initial encounter: Secondary | ICD-10-CM

## 2014-06-11 DIAGNOSIS — Z23 Encounter for immunization: Secondary | ICD-10-CM

## 2014-06-11 DIAGNOSIS — S8992XA Unspecified injury of left lower leg, initial encounter: Secondary | ICD-10-CM

## 2014-06-11 DIAGNOSIS — S01111A Laceration without foreign body of right eyelid and periocular area, initial encounter: Secondary | ICD-10-CM

## 2014-06-11 DIAGNOSIS — S8990XA Unspecified injury of unspecified lower leg, initial encounter: Secondary | ICD-10-CM

## 2014-06-11 DIAGNOSIS — S99919A Unspecified injury of unspecified ankle, initial encounter: Secondary | ICD-10-CM

## 2014-06-11 DIAGNOSIS — S0180XA Unspecified open wound of other part of head, initial encounter: Secondary | ICD-10-CM

## 2014-06-11 NOTE — Patient Instructions (Signed)

## 2014-06-11 NOTE — Progress Notes (Signed)
   Subjective:    Patient ID: Allison Parker, female    DOB: 1941/07/17, 73 y.o.   MRN: [id number]  HPI Patient tripped over her own feet and landed on left knee and hit her head on doorknob lacerating her right eyebrow. Did not knock her out and she denies headache or blurred vision. 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    Review of Systems  Constitutional: Negative.   HENT: Negative.   Respiratory: Negative.   Cardiovascular: Negative.   Neurological: Negative for dizziness, facial asymmetry, weakness, light-headedness and headaches.  Psychiatric/Behavioral: Negative.   All other systems reviewed and are negative.      Objective:   Physical Exam  Constitutional: She is oriented to person, place, and time. She appears well-developed and well-nourished.  HENT:  Head:    Cardiovascular: Normal rate, regular rhythm and normal heart sounds.   Pulmonary/Chest: Effort normal and breath sounds normal.  Musculoskeletal:  Left knee swollen and tender to touch. Np pain on flexion and extension  Neurological: She is alert and oriented to person, place, and time. She has normal reflexes. No cranial nerve deficit.  Skin: Skin is warm.  3cm smooth laceration to right eye brow and upper lid  Psychiatric: She has a normal mood and affect. Her behavior is normal. Judgment and thought content normal.   Verbal consent given by patient. Local anesthesia Lidocaine 2% with 59ml Betadine prep 4-0 ethilon suture-5 simple interrupted stitches Cleaned with Saline Antibiotic ointment  BP 134/76  Pulse 75  Ht 5\' 5"  (1.651 m)  Wt 196 lb (88.905 kg)  BMI 32.62 kg/m2  left knee xray- no fracture- prosthesis appears intact- hematoma above knee noted-Preliminary reading by Ronnald Collum, FNP  [facility]        Assessment & Plan:  1. Left knee injury, initial encounter Rest ice - DG Knee 1-2 Views Left; Future  2. Laceration of eyebrow without  complication, right, initial encounter Keep clean and dry Watch for signs of infection RTO in 7 days for stitch removal Td today  Mary-Margaret Hassell Done, FNP

## 2014-06-11 NOTE — Telephone Encounter (Signed)
Patient just suffered a fall where she struck her head. She has a laceration above her eyebrow that she would like sutured. She denies LOC. She is not on blood thinners. Explained that ER visit is optimal so that they can evaluate for a head injury. We aren't able to perform CT scans in our office. Depending on the severity of the laceration she may be asked to go to the ER after the evaluation since it is a facial laceration.  Patient strongly prefers not to go to the ER. She doesn't think it is necessary and would like to come here for an evaluation and possible suturing. Made aware that we may suggest she go to the ER after the exam.

## 2014-06-17 ENCOUNTER — Other Ambulatory Visit: Payer: Self-pay | Admitting: Pharmacist

## 2014-06-17 DIAGNOSIS — M4802 Spinal stenosis, cervical region: Secondary | ICD-10-CM

## 2014-06-17 DIAGNOSIS — G890 Central pain syndrome: Secondary | ICD-10-CM

## 2014-06-17 MED ORDER — HYDROCODONE-ACETAMINOPHEN 10-325 MG PO TABS: 1.0000 | ORAL_TABLET | Freq: Three times a day (TID) | ORAL | Status: DC | PRN

## 2014-06-17 NOTE — Telephone Encounter (Signed)
This is okay to refill 

## 2014-06-17 NOTE — Telephone Encounter (Signed)
Please have patient to come and pick prescription up

## 2014-06-17 NOTE — Telephone Encounter (Signed)
Please print, sign, and have nurse call to pickup.

## 2014-06-19 ENCOUNTER — Ambulatory Visit (INDEPENDENT_AMBULATORY_CARE_PROVIDER_SITE_OTHER): Payer: Medicare FFS | Admitting: Nurse Practitioner

## 2014-06-19 ENCOUNTER — Encounter: Payer: Self-pay | Admitting: Nurse Practitioner

## 2014-06-19 VITALS — BP 129/67 | HR 78 | Temp 98.0°F | Ht 65.0 in | Wt 200.8 lb

## 2014-06-19 DIAGNOSIS — Z4802 Encounter for removal of sutures: Secondary | ICD-10-CM

## 2014-06-19 NOTE — Patient Instructions (Signed)

## 2014-06-19 NOTE — Progress Notes (Signed)
   Subjective:    Patient ID: Allison Parker, female    DOB: [birth year], 73 y.o.   MRN: [id number]  HPI Patient fell last Tuesday and hit her head on door knob causing a laeration above right eyebrow- Came in and had sutures inserted and is here today to have stitches removed.    Review of Systems  Eyes: Negative for visual disturbance.  Neurological: Negative for dizziness, light-headedness and headaches.  All other systems reviewed and are negative.      Objective:   Physical Exam  Constitutional: She appears well-developed and well-nourished.  Cardiovascular: Normal rate and normal heart sounds.   Pulmonary/Chest: Effort normal and breath sounds normal.  Skin: Skin is warm.  Wound edges to laceration to  Right eye brow are well approximated without signs of infection.  Psychiatric: She has a normal mood and affect. Her behavior is normal. Judgment and thought content normal.    BP 129/67  Pulse 78  Temp(Src) 98 F (36.7 C) (Oral)  Ht 5\' 5"  (1.651 m)  Wt 200 lb 12.8 oz (91.082 kg)  BMI 33.41 kg/m2       Assessment & Plan:   1. Visit for suture removal    Keep wound clean and dry Do  Not pick or scratch RTO prn  Mary-Margaret Hassell Done, FNP

## 2014-07-04 ENCOUNTER — Other Ambulatory Visit: Payer: Self-pay | Admitting: Family Medicine

## 2014-07-05 ENCOUNTER — Other Ambulatory Visit: Payer: Self-pay | Admitting: *Deleted

## 2014-07-05 DIAGNOSIS — I10 Essential (primary) hypertension: Secondary | ICD-10-CM

## 2014-07-05 MED ORDER — LEVOTHYROXINE SODIUM 25 MCG PO TABS: ORAL_TABLET | ORAL | Status: DC

## 2014-07-05 MED ORDER — AMLODIPINE BESYLATE 5 MG PO TABS: 5.0000 mg | ORAL_TABLET | Freq: Every day | ORAL | Status: DC

## 2014-07-12 ENCOUNTER — Ambulatory Visit (INDEPENDENT_AMBULATORY_CARE_PROVIDER_SITE_OTHER): Payer: Medicare FFS | Admitting: Family Medicine

## 2014-07-12 ENCOUNTER — Encounter: Payer: Self-pay | Admitting: Family Medicine

## 2014-07-12 ENCOUNTER — Telehealth: Payer: Self-pay | Admitting: Family Medicine

## 2014-07-12 VITALS — BP 143/73 | HR 75 | Temp 98.9°F | Ht 65.0 in | Wt 197.6 lb

## 2014-07-12 DIAGNOSIS — I10 Essential (primary) hypertension: Secondary | ICD-10-CM

## 2014-07-12 DIAGNOSIS — G890 Central pain syndrome: Secondary | ICD-10-CM

## 2014-07-12 DIAGNOSIS — M4802 Spinal stenosis, cervical region: Secondary | ICD-10-CM

## 2014-07-12 DIAGNOSIS — E039 Hypothyroidism, unspecified: Secondary | ICD-10-CM

## 2014-07-12 DIAGNOSIS — R072 Precordial pain: Secondary | ICD-10-CM

## 2014-07-12 DIAGNOSIS — N318 Other neuromuscular dysfunction of bladder: Secondary | ICD-10-CM

## 2014-07-12 DIAGNOSIS — E785 Hyperlipidemia, unspecified: Secondary | ICD-10-CM

## 2014-07-12 DIAGNOSIS — N3281 Overactive bladder: Secondary | ICD-10-CM

## 2014-07-12 LAB — POCT CBC
Granulocyte percent: 61.7 %G (ref 37–80)
HCT, POC: 40 % (ref 37.7–47.9)
Hemoglobin: 13.3 g/dL (ref 12.2–16.2)
Lymph, poc: 2.1 (ref 0.6–3.4)
MCH, POC: 29.8 pg (ref 27–31.2)
MCHC: 33.3 g/dL (ref 31.8–35.4)
MCV: 89.6 fL (ref 80–97)
MPV: 8 fL (ref 0–99.8)
POC Granulocyte: 3.8 (ref 2–6.9)
POC LYMPH PERCENT: 33.9 %L (ref 10–50)
Platelet Count, POC: 251 10*3/uL (ref 142–424)
RBC: 4.5 M/uL (ref 4.04–5.48)
RDW, POC: 12.8 %
WBC: 6.1 10*3/uL (ref 4.6–10.2)

## 2014-07-12 MED ORDER — BACLOFEN 20 MG PO TABS: 20.0000 mg | ORAL_TABLET | Freq: Four times a day (QID) | ORAL | Status: DC

## 2014-07-12 MED ORDER — HYDROCODONE-ACETAMINOPHEN 10-325 MG PO TABS: 1.0000 | ORAL_TABLET | Freq: Three times a day (TID) | ORAL | Status: DC | PRN

## 2014-07-12 MED ORDER — MIRABEGRON ER 25 MG PO TB24: 25.0000 mg | ORAL_TABLET | Freq: Every day | ORAL | Status: DC

## 2014-07-12 NOTE — Progress Notes (Signed)
   Subjective:    Patient ID: Allison Parker, female    DOB: June 12, 1941, 73 y.o.   MRN: [id number]  HPI This 73 y.o. female presents for evaluation of follow up on hypertension.  She has been having chest pain and discomfort and has some pressure in her chest at present. She was wondering if she has  Angina.  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  She takes baclofen which she States does not work well. She has tremors.  She admits she has been coughing and has had some sinus drainage.   Review of Systems C/o chest pain No SOB, HA, dizziness, vision change, N/V, diarrhea, constipation, dysuria, urinary urgency or frequency, myalgias, arthralgias or rash.     Objective:   Physical Exam Vital signs noted  Well developed well nourished female.  HEENT - Head atraumatic Normocephalic                Eyes - PERRLA, Conjuctiva - clear Sclera- Clear EOMI                Ears - EAC's Wnl TM's Wnl Gross Hearing WNL                Nose - Nares patent                 Throat - oropharanx wnl Respiratory - Lungs CTA bilateral Cardiac - RRR S1 and S2 without murmur GI - Abdomen soft Nontender and bowel sounds active x 4 Extremities - No edema. Neuro - right hemiparesis with tremors MS - TTP left chest wall  EKG - NSR without acute st-t changes.     Assessment & Plan:  Thalamic pain syndrome - Plan: baclofen (LIORESAL) 20 MG tablet, HYDROcodone-acetaminophen (NORCO) 10-325 MG per tablet  Spinal stenosis of cervical region - Plan: HYDROcodone-acetaminophen (NORCO) 10-325 MG per tablet  OAB (overactive bladder) - Plan: mirabegron ER (MYRBETRIQ) 25 MG TB24 tablet  Precordial pain - Plan: EKG 12-Lead, Ambulatory referral to Pediatric Cardiology, Holter monitor - 24 hour.    Chest wall pain - Discussed with patient that she probably has MS associated chest pain due to cough.  Palpitations - 24 hour holter monitor.  Spent over 45 minutes with patient in  visit.  Lysbeth Penner FNP

## 2014-07-13 LAB — CMP14+EGFR
ALT: 64 IU/L — ABNORMAL HIGH (ref 0–32)
AST: 33 IU/L (ref 0–40)
Albumin/Globulin Ratio: 1.8 (ref 1.1–2.5)
Albumin: 4.4 g/dL (ref 3.5–4.8)
Alkaline Phosphatase: 172 IU/L — ABNORMAL HIGH (ref 39–117)
BUN/Creatinine Ratio: 17 (ref 11–26)
BUN: 13 mg/dL (ref 8–27)
CO2: 28 mmol/L (ref 18–29)
Calcium: 9.9 mg/dL (ref 8.7–10.3)
Chloride: 96 mmol/L — ABNORMAL LOW (ref 97–108)
Creatinine, Ser: 0.75 mg/dL (ref 0.57–1.00)
GFR calc Af Amer: 93 mL/min/{1.73_m2} (ref >59)
GFR calc non Af Amer: 81 mL/min/{1.73_m2} (ref >59)
Globulin, Total: 2.5 g/dL (ref 1.5–4.5)
Glucose: 93 mg/dL (ref 65–99)
Potassium: 4.6 mmol/L (ref 3.5–5.2)
Sodium: 138 mmol/L (ref 134–144)
Total Bilirubin: 0.5 mg/dL (ref 0.0–1.2)
Total Protein: 6.9 g/dL (ref 6.0–8.5)

## 2014-07-13 LAB — LIPID PANEL
Chol/HDL Ratio: 1.8 ratio units (ref 0.0–4.4)
Cholesterol, Total: 159 mg/dL (ref 100–199)
HDL: 86 mg/dL (ref >39)
LDL Calculated: 55 mg/dL (ref 0–99)
Triglycerides: 91 mg/dL (ref 0–149)
VLDL Cholesterol Cal: 18 mg/dL (ref 5–40)

## 2014-07-13 LAB — TSH: TSH: 2.2 u[IU]/mL (ref 0.450–4.500)

## 2014-07-15 NOTE — Telephone Encounter (Signed)
Patient will come back on 07/16/14 for monitor placement.

## 2014-07-16 ENCOUNTER — Other Ambulatory Visit: Payer: Self-pay | Admitting: *Deleted

## 2014-07-16 ENCOUNTER — Encounter: Payer: Medicare FFS | Admitting: *Deleted

## 2014-07-16 DIAGNOSIS — R072 Precordial pain: Secondary | ICD-10-CM

## 2014-07-16 NOTE — Addendum Note (Signed)
Addended by: Thana Ates on: 07/16/2014 09:04 AM   Modules accepted: Orders

## 2014-07-19 ENCOUNTER — Other Ambulatory Visit: Payer: Self-pay | Admitting: Family Medicine

## 2014-07-23 ENCOUNTER — Telehealth: Payer: Self-pay | Admitting: *Deleted

## 2014-07-23 NOTE — Telephone Encounter (Signed)
Pt notified of Holter monitor results Verbalizes understanding

## 2014-08-04 ENCOUNTER — Other Ambulatory Visit: Payer: Self-pay | Admitting: Family Medicine

## 2014-08-13 ENCOUNTER — Encounter: Payer: Self-pay | Admitting: *Deleted

## 2014-08-19 ENCOUNTER — Other Ambulatory Visit: Payer: Self-pay | Admitting: Pharmacist

## 2014-08-19 DIAGNOSIS — G890 Central pain syndrome: Secondary | ICD-10-CM

## 2014-08-19 DIAGNOSIS — M4802 Spinal stenosis, cervical region: Secondary | ICD-10-CM

## 2014-08-19 MED ORDER — HYDROCODONE-ACETAMINOPHEN 10-325 MG PO TABS: 1.0000 | ORAL_TABLET | Freq: Three times a day (TID) | ORAL | Status: DC | PRN

## 2014-08-20 ENCOUNTER — Encounter: Payer: Self-pay | Admitting: Cardiology

## 2014-08-20 ENCOUNTER — Ambulatory Visit (INDEPENDENT_AMBULATORY_CARE_PROVIDER_SITE_OTHER): Payer: Medicare HMO | Admitting: Cardiology

## 2014-08-20 VITALS — BP 132/66 | HR 73 | Ht 65.0 in | Wt 199.4 lb

## 2014-08-20 DIAGNOSIS — R072 Precordial pain: Secondary | ICD-10-CM

## 2014-08-20 DIAGNOSIS — R079 Chest pain, unspecified: Secondary | ICD-10-CM | POA: Insufficient documentation

## 2014-08-20 DIAGNOSIS — I10 Essential (primary) hypertension: Secondary | ICD-10-CM

## 2014-08-20 NOTE — Progress Notes (Signed)
HPI The patient presents for evaluation of possible cardiac issues with her current dose of Celexa. She read about this and was concerned. She been having some palpitations. She did wear a monitor and I reviewed this. This demonstrated some atrial arrhythmias but no sustained tachyarrhythmias and no ventricular ectopy. Her EKG demonstrates no QT prolongation. She has not had presyncope or syncope. She is slightly limited by a previous stroke but she does do her household chores such as pushing a vacuum. She does mention that she's had been no development of some chest discomfort with activities and occasionally at rest at night.  This is not always reproducible that happens frequently with activity. She describes a dull discomfort without radiation. It may last 10-15 minutes. She stopped she is doing but she sits up in bed. She does describe some associated mild nausea or shortness of breath. She's not having any PND or orthopnea. She's not having any weight gain or edema.  Allergies  Allergen Reactions  . Ace Inhibitors     Current Outpatient Prescriptions  Medication Sig Dispense Refill  . amLODipine (NORVASC) 5 MG tablet Take 1 tablet (5 mg total) by mouth daily.  30 tablet  2  . atorvastatin (LIPITOR) 20 MG tablet TAKE 1 TABLET BY MOUTH DAILY  30 tablet  3  . baclofen (LIORESAL) 20 MG tablet Take 1 tablet (20 mg total) by mouth 4 (four) times daily. TAKE 1 TABLET BY MOUTH TWO TIMES A DAY AS NEEDED FOR MUSCLE SPASMS  120 tablet  5  . Calcium Carbonate-Vitamin D (SM CALCIUM 500/VITAMIN D3 PO) Take by mouth.      . Cholecalciferol (VITAMIN D-1000 MAX ST PO) Take 0.5 tablets by mouth daily.      . citalopram (CELEXA) 40 MG tablet TAKE 1 TABLET (40 MG TOTAL) BY MOUTH DAILY.  30 tablet  2  . Coenzyme Q10 (CO Q-10) 300 MG CAPS Take 1 capsule by mouth daily.      Marland Kitchen HYDROcodone-acetaminophen (NORCO) 10-325 MG per tablet Take 1 tablet by mouth every 8 (eight) hours as needed.  90 tablet  0  .  levothyroxine (SYNTHROID, LEVOTHROID) 25 MCG tablet TAKE 1 TABLET BY MOUTH ONE TIME A DAY  30 tablet  10  . multivitamin-lutein (OCUVITE-LUTEIN) CAPS Take 1 capsule by mouth daily.      . solifenacin (VESICARE) 5 MG tablet Take 1 tablet (5 mg total) by mouth daily.  90 tablet  3   No current facility-administered medications for this visit.    Past Medical History  Diagnosis Date  . Hyperlipidemia   . Stroke   . Thyroid disease     hypothyroid  . Hypertension   . Insomnia     Past Surgical History  Procedure Laterality Date  . Cholecystectomy    . 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 Breast surgery      lumpectomy    No family history on file.  History   Social History  . Marital Status: Married    Spouse Name: N/A    Number of Children: N/A  . Years of Education: N/A   Occupational History  . Not on file.   Social History Main Topics  . Smoking status: Never Smoker   . Smokeless tobacco: Not on file  . Alcohol Use: Yes  . Drug Use: No  . Sexual Activity: Not on file   Other Topics Concern  . Not on file   Social History Narrative  . No narrative on  file    ROS:  As stated in the HPI and negative for all other systems.  PHYSICAL EXAM BP 132/66  Pulse 73  Ht 5\' 5"  (1.651 m)  Wt 199 lb 6.4 oz (90.447 kg)  BMI 33.18 kg/m2 GENERAL:  Well appearing HEENT:  Pupils equal round and reactive, fundi not visualized, oral mucosa unremarkable NECK:  No jugular venous distention, waveform within normal limits, carotid upstroke brisk and symmetric, no bruits, no thyromegaly LYMPHATICS:  No cervical, inguinal adenopathy LUNGS:  Clear to auscultation bilaterally BACK:  No CVA tenderness CHEST:  Unremarkable HEART:  PMI not displaced or sustained,S1 and S2 within normal limits, no S3, no S4, no clicks, no rubs, no murmurs ABD:  Flat, positive bowel sounds normal in frequency in pitch, no bruits, no rebound, no guarding, no midline pulsatile mass, no hepatomegaly,  no splenomegaly EXT:  2 plus pulses throughout, no edema, no cyanosis no clubbing SKIN:  No rashes no nodules NEURO:  Cranial nerves II through XII grossly intact, right-sided weakness PSYCH:  Cognitively intact, oriented to person place and time  EKG:  Sinus rhythm, rate 75, axis within normal limits, intervals within normal limits, no acute ST-T wave changes. 08/20/2014   ASSESSMENT AND PLAN   CHEST PAIN:  There are typical and atypical features. I would like to screen her with a stress test. However, she treadmill. Therefore, she will have a [employer].  Further evaluation will be based on this result.  HIGH RISK MED:  There is a warning against high dose Celexa in the elderly. However, her QT interval is unremarkable. I see no contraindication to this dose although she did have routine EKGs. In addition we discussed drug interactions and she should be vigilant about new medicines that are prescribed.

## 2014-08-20 NOTE — Patient Instructions (Signed)
Your physician recommends that you schedule a follow-up appointment in: as needed with Dr. Percival Spanish  We are ordering  A stress test

## 2014-08-22 ENCOUNTER — Telehealth (HOSPITAL_COMMUNITY): Payer: Self-pay | Admitting: *Deleted

## 2014-08-29 ENCOUNTER — Other Ambulatory Visit: Payer: Self-pay | Admitting: Family Medicine

## 2014-08-30 ENCOUNTER — Encounter (HOSPITAL_COMMUNITY): Payer: Medicare HMO

## 2014-09-09 ENCOUNTER — Encounter: Payer: Self-pay | Admitting: *Deleted

## 2014-09-23 ENCOUNTER — Telehealth: Payer: Self-pay | Admitting: Pharmacist

## 2014-09-23 DIAGNOSIS — M4802 Spinal stenosis, cervical region: Secondary | ICD-10-CM

## 2014-09-23 DIAGNOSIS — G890 Central pain syndrome: Secondary | ICD-10-CM

## 2014-09-23 MED ORDER — HYDROCODONE-ACETAMINOPHEN 10-325 MG PO TABS: 1.0000 | ORAL_TABLET | Freq: Three times a day (TID) | ORAL | Status: DC | PRN

## 2014-09-23 NOTE — Telephone Encounter (Signed)
Rx printed and signed by Dietrich Pates, NP Patient notified that rx for Norco is ready for pick up at front desk.

## 2014-10-06 ENCOUNTER — Other Ambulatory Visit: Payer: Self-pay | Admitting: Family Medicine

## 2014-10-15 ENCOUNTER — Other Ambulatory Visit: Payer: Self-pay | Admitting: Family Medicine

## 2014-10-16 ENCOUNTER — Other Ambulatory Visit: Payer: Self-pay | Admitting: Pharmacist

## 2014-10-16 DIAGNOSIS — G890 Central pain syndrome: Secondary | ICD-10-CM

## 2014-10-16 DIAGNOSIS — M4802 Spinal stenosis, cervical region: Secondary | ICD-10-CM

## 2014-10-16 MED ORDER — HYDROCODONE-ACETAMINOPHEN 10-325 MG PO TABS: 1.0000 | ORAL_TABLET | Freq: Three times a day (TID) | ORAL | Status: DC | PRN

## 2014-10-16 NOTE — Telephone Encounter (Signed)
rx written and left at front desk for patient  Patient notified.

## 2014-10-29 ENCOUNTER — Telehealth: Payer: Self-pay | Admitting: Family Medicine

## 2014-10-30 ENCOUNTER — Encounter: Payer: Self-pay | Admitting: *Deleted

## 2014-10-30 NOTE — Telephone Encounter (Signed)
Aware, letter to be released from jury duty ready.

## 2014-10-30 NOTE — Telephone Encounter (Signed)
Please call patient and tell her to explain this to clerk of court.  I find that this is all patients need to do is talk to the clerk or court and notes are not necessary.

## 2014-11-11 ENCOUNTER — Ambulatory Visit (INDEPENDENT_AMBULATORY_CARE_PROVIDER_SITE_OTHER): Payer: Medicare FFS | Admitting: Family

## 2014-11-11 ENCOUNTER — Encounter: Payer: Self-pay | Admitting: Family

## 2014-11-11 ENCOUNTER — Ambulatory Visit: Payer: Medicare FFS | Admitting: Family Medicine

## 2014-11-11 VITALS — BP 138/84 | HR 74 | Temp 97.4°F | Ht 65.0 in | Wt 197.0 lb

## 2014-11-11 DIAGNOSIS — F411 Generalized anxiety disorder: Secondary | ICD-10-CM

## 2014-11-11 DIAGNOSIS — G890 Central pain syndrome: Secondary | ICD-10-CM

## 2014-11-11 DIAGNOSIS — E785 Hyperlipidemia, unspecified: Secondary | ICD-10-CM

## 2014-11-11 DIAGNOSIS — E039 Hypothyroidism, unspecified: Secondary | ICD-10-CM

## 2014-11-11 DIAGNOSIS — M4802 Spinal stenosis, cervical region: Secondary | ICD-10-CM

## 2014-11-11 DIAGNOSIS — Z23 Encounter for immunization: Secondary | ICD-10-CM

## 2014-11-11 DIAGNOSIS — I10 Essential (primary) hypertension: Secondary | ICD-10-CM

## 2014-11-11 DIAGNOSIS — F32A Depression, unspecified: Secondary | ICD-10-CM | POA: Insufficient documentation

## 2014-11-11 DIAGNOSIS — E559 Vitamin D deficiency, unspecified: Secondary | ICD-10-CM

## 2014-11-11 DIAGNOSIS — N3281 Overactive bladder: Secondary | ICD-10-CM

## 2014-11-11 DIAGNOSIS — F329 Major depressive disorder, single episode, unspecified: Secondary | ICD-10-CM

## 2014-11-11 DIAGNOSIS — Z13820 Encounter for screening for osteoporosis: Secondary | ICD-10-CM

## 2014-11-11 MED ORDER — SOLIFENACIN SUCCINATE 5 MG PO TABS: 5.0000 mg | ORAL_TABLET | Freq: Every day | ORAL | Status: DC

## 2014-11-11 MED ORDER — HYDROCODONE-ACETAMINOPHEN 10-325 MG PO TABS: 1.0000 | ORAL_TABLET | Freq: Three times a day (TID) | ORAL | Status: DC | PRN

## 2014-11-11 NOTE — Progress Notes (Signed)
Subjective:    Patient ID: Allison Parker, female    DOB: 1940-11-12, 74 y.o.   MRN: [id number]  Hypertension This is a chronic problem. The current episode started more than 1 year ago. The problem has been resolved since onset. The problem is controlled. Associated symptoms include anxiety. Pertinent negatives include no headaches, neck pain, palpitations, peripheral edema or shortness of breath. Risk factors for coronary artery disease include dyslipidemia, obesity and sedentary lifestyle. Past treatments include calcium channel blockers. The current treatment provides moderate improvement. Hypertensive end-organ damage includes CVA and a thyroid problem. There is no history of kidney disease, CAD/MI or heart failure. There is no history of sleep apnea.  Hyperlipidemia This is a chronic problem. The current episode started more than 1 year ago. The problem is controlled. Recent lipid tests were reviewed and are normal. Exacerbating diseases include hypothyroidism. She has no history of diabetes. Pertinent negatives include no leg pain, myalgias or shortness of breath. Current antihyperlipidemic treatment includes statins. The current treatment provides moderate improvement of lipids. Risk factors for coronary artery disease include dyslipidemia, hypertension, obesity, post-menopausal and a sedentary lifestyle.  Thyroid Problem Presents for follow-up visit. Symptoms include anxiety, depressed mood and dry skin. Patient reports no diarrhea, hair loss, hoarse voice, palpitations or visual change. The symptoms have been stable. Past treatments include levothyroxine. The treatment provided significant relief. Her past medical history is significant for hyperlipidemia. There is no history of diabetes or heart failure.  Anxiety Presents for follow-up visit. Symptoms include depressed mood and nervous/anxious behavior. Patient reports no excessive worry, insomnia, muscle tension, palpitations or shortness  of breath. Symptoms occur rarely. The symptoms are aggravated by family issues.   Her past medical history is significant for anxiety/panic attacks, CAD and depression. Past treatments include SSRIs.      Review of Systems  Constitutional: Negative.   HENT: Negative.  Negative for hoarse voice.   Eyes: Negative.   Respiratory: Negative.  Negative for shortness of breath.   Cardiovascular: Negative.  Negative for palpitations.  Gastrointestinal: Negative.  Negative for diarrhea.  Endocrine: Negative.   Genitourinary: Negative.   Musculoskeletal: Negative.  Negative for myalgias and neck pain.  Neurological: Negative.  Negative for headaches.  Hematological: Negative.   Psychiatric/Behavioral: The patient is nervous/anxious. The patient does not have insomnia.   All other systems reviewed and are negative.      Objective:   Physical Exam  Constitutional: She is oriented to person, place, and time. She appears well-developed and well-nourished. No distress.  HENT:  Head: Normocephalic and atraumatic.  Right Ear: External ear normal.  Left Ear: External ear normal.  Nose: Nose normal.  Mouth/Throat: Oropharynx is clear and moist.  Eyes: Pupils are equal, round, and reactive to light.  Neck: Normal range of motion. Neck supple. No thyromegaly present.  Cardiovascular: Normal rate, regular rhythm, normal heart sounds and intact distal pulses.   No murmur heard. Pulmonary/Chest: Effort normal and breath sounds normal. No respiratory distress. She has no wheezes.  Abdominal: Soft. Bowel sounds are normal. She exhibits no distension. There is no tenderness.  Musculoskeletal: Normal range of motion. She exhibits edema (trace amt in right leg). She exhibits no tenderness.  Neurological: She is alert and oriented to person, place, and time. She has normal reflexes. No cranial nerve deficit.  Skin: Skin is warm and dry.  Psychiatric: She has a normal mood and affect. Her behavior is  normal. Judgment and thought content normal.  Vitals reviewed.  BP 138/84 mmHg  Pulse 74  Temp(Src) 97.4 F (36.3 C) (Oral)  Ht 5' 5"  (1.651 m)  Wt 197 lb (89.359 kg)  BMI 32.78 kg/m2     Assessment & Plan:  1. Essential hypertension, benign - CMP14+EGFR  2. Hyperlipidemia - CMP14+EGFR - Lipid panel  3. Overactive bladder - CMP14+EGFR - solifenacin (VESICARE) 5 MG tablet; Take 1 tablet (5 mg total) by mouth daily.  Dispense: 90 tablet; Refill: 3  4. Vitamin D deficiency - CMP14+EGFR - Vit D  25 hydroxy (rtn osteoporosis monitoring)  5. GAD (generalized anxiety disorder) - CMP14+EGFR  6. Depression - CMP14+EGFR  7. Hypothyroidism, unspecified hypothyroidism type - CMP14+EGFR - Thyroid Panel With TSH  8. Thalamic pain syndrome - CMP14+EGFR - HYDROcodone-acetaminophen (NORCO) 10-325 MG per tablet; Take 1 tablet by mouth every 8 (eight) hours as needed.  Dispense: 90 tablet; Refill: 0  9. Spinal stenosis of cervical region - CMP14+EGFR - HYDROcodone-acetaminophen (NORCO) 10-325 MG per tablet; Take 1 tablet by mouth every 8 (eight) hours as needed.  Dispense: 90 tablet; Refill: 0  10. Osteoporosis screening - DG Bone Density; Future   Continue all meds Labs pending Health Maintenance reviewed-Flu vaccine given today Diet and exercise encouraged RTO 4 months  Evelina Dun, FNP

## 2014-11-11 NOTE — Patient Instructions (Addendum)
Health Maintenance Adopting a healthy lifestyle and getting preventive care can go a long way to promote health and wellness. Talk with your health care provider about what schedule of regular examinations is right for you. This is a good chance for you to check in with your provider about disease prevention and staying healthy. In between checkups, there are plenty of things you can do on your own. Experts have done a lot of research about which lifestyle changes and preventive measures are most likely to keep you healthy. Ask your health care provider for more information. WEIGHT AND DIET  Eat a healthy diet  Be sure to include plenty of vegetables, fruits, low-fat dairy products, and lean protein.  Do not eat a lot of foods high in solid fats, added sugars, or salt.  Get regular exercise. This is one of the most important things you can do for your health.  Most adults should exercise for at least 150 minutes each week. The exercise should increase your heart rate and make you sweat (moderate-intensity exercise).  Most adults should also do strengthening exercises at least twice a week. This is in addition to the moderate-intensity exercise.  Maintain a healthy weight  Body mass index (BMI) is a measurement that can be used to identify possible weight problems. It estimates body fat based on height and weight. Your health care provider can help determine your BMI and help you achieve or maintain a healthy weight.  For females 25 years of age and older:   A BMI below 18.5 is considered underweight.  A BMI of 18.5 to 24.9 is normal.  A BMI of 25 to 29.9 is considered overweight.  A BMI of 30 and above is considered obese.  Watch levels of cholesterol and blood lipids  You should start having your blood tested for lipids and cholesterol at 72 years of age, then have this test every 5 years.  You may need to have your cholesterol levels checked more often if:  Your lipid or  cholesterol levels are high.  You are older than 72 years of age.  You are at high risk for heart disease.  CANCER SCREENING   Lung Cancer  Lung cancer screening is recommended for adults 97-92 years old who are at high risk for lung cancer because of a history of smoking.  A yearly low-dose CT scan of the lungs is recommended for people who:  Currently smoke.  Have quit within the past 15 years.  Have at least a 30-pack-year history of smoking. A pack year is smoking an average of one pack of cigarettes a day for 1 year.  Yearly screening should continue until it has been 15 years since you quit.  Yearly screening should stop if you develop a health problem that would prevent you from having lung cancer treatment.  Breast Cancer  Practice breast self-awareness. This means understanding how your breasts normally appear and feel.  It also means doing regular breast self-exams. Let your health care provider know about any changes, no matter how small.  If you are in your 20s or 30s, you should have a clinical breast exam (CBE) by a health care provider every 1-3 years as part of a regular health exam.  If you are 76 or older, have a CBE every year. Also consider having a breast X-ray (mammogram) every year.  If you have a family history of breast cancer, talk to your health care provider about genetic screening.  If you are  at high risk for breast cancer, talk to your health care provider about having an MRI and a mammogram every year.  Breast cancer gene (BRCA) assessment is recommended for women who have family members with BRCA-related cancers. BRCA-related cancers include:  Breast.  Ovarian.  Tubal.  Peritoneal cancers.  Results of the assessment will determine the need for genetic counseling and BRCA1 and BRCA2 testing. Cervical Cancer Routine pelvic examinations to screen for cervical cancer are no longer recommended for nonpregnant women who are considered low  risk for cancer of the pelvic organs (ovaries, uterus, and vagina) and who do not have symptoms. A pelvic examination may be necessary if you have symptoms including those associated with pelvic infections. Ask your health care provider if a screening pelvic exam is right for you.   The Pap test is the screening test for cervical cancer for women who are considered at risk.  If you had a hysterectomy for a problem that was not cancer or a condition that could lead to cancer, then you no longer need Pap tests.  If you are older than 65 years, and you have had normal Pap tests for the past 10 years, you no longer need to have Pap tests.  If you have had past treatment for cervical cancer or a condition that could lead to cancer, you need Pap tests and screening for cancer for at least 20 years after your treatment.  If you no longer get a Pap test, assess your risk factors if they change (such as having a new sexual partner). This can affect whether you should start being screened again.  Some women have medical problems that increase their chance of getting cervical cancer. If this is the case for you, your health care provider may recommend more frequent screening and Pap tests.  The human papillomavirus (HPV) test is another test that may be used for cervical cancer screening. The HPV test looks for the virus that can cause cell changes in the cervix. The cells collected during the Pap test can be tested for HPV.  The HPV test can be used to screen women 30 years of age and older. Getting tested for HPV can extend the interval between normal Pap tests from three to five years.  An HPV test also should be used to screen women of any age who have unclear Pap test results.  After 72 years of age, women should have HPV testing as often as Pap tests.  Colorectal Cancer  This type of cancer can be detected and often prevented.  Routine colorectal cancer screening usually begins at 72 years of  age and continues through 72 years of age.  Your health care provider may recommend screening at an earlier age if you have risk factors for colon cancer.  Your health care provider may also recommend using home test kits to check for hidden blood in the stool.  A small camera at the end of a tube can be used to examine your colon directly (sigmoidoscopy or colonoscopy). This is done to check for the earliest forms of colorectal cancer.  Routine screening usually begins at age 50.  Direct examination of the colon should be repeated every 5-10 years through 72 years of age. However, you may need to be screened more often if early forms of precancerous polyps or small growths are found. Skin Cancer  Check your skin from head to toe regularly.  Tell your health care provider about any new moles or changes in   moles, especially if there is a change in a mole's shape or color.  Also tell your health care provider if you have a mole that is larger than the size of a pencil eraser.  Always use sunscreen. Apply sunscreen liberally and repeatedly throughout the day.  Protect yourself by wearing long sleeves, pants, a wide-brimmed hat, and sunglasses whenever you are outside. HEART DISEASE, DIABETES, AND HIGH BLOOD PRESSURE   Have your blood pressure checked at least every 1-2 years. High blood pressure causes heart disease and increases the risk of stroke.  If you are between 75 years and 42 years old, ask your health care provider if you should take aspirin to prevent strokes.  Have regular diabetes screenings. This involves taking a blood sample to check your fasting blood sugar level.  If you are at a normal weight and have a low risk for diabetes, have this test once every three years after 72 years of age.  If you are overweight and have a high risk for diabetes, consider being tested at a younger age or more often. PREVENTING INFECTION  Hepatitis B  If you have a higher risk for  hepatitis B, you should be screened for this virus. You are considered at high risk for hepatitis B if:  You were born in a country where hepatitis B is common. Ask your health care provider which countries are considered high risk.  Your parents were born in a high-risk country, and you have not been immunized against hepatitis B (hepatitis B vaccine).  You have HIV or AIDS.  You use needles to inject street drugs.  You live with someone who has hepatitis B.  You have had sex with someone who has hepatitis B.  You get hemodialysis treatment.  You take certain medicines for conditions, including cancer, organ transplantation, and autoimmune conditions. Hepatitis C  Blood testing is recommended for:  Everyone born from [birth year] through 1963.  Anyone with known risk factors for hepatitis C. Sexually transmitted infections (STIs)  You should be screened for sexually transmitted infections (STIs) including gonorrhea and chlamydia if:  You are sexually active and are younger than 72 years of age.  You are older than 72 years of age and your health care provider tells you that you are at risk for this type of infection.  Your sexual activity has changed since you were last screened and you are at an increased risk for chlamydia or gonorrhea. Ask your health care provider if you are at risk.  If you do not have HIV, but are at risk, it may be recommended that you take a prescription medicine daily to prevent HIV infection. This is called pre-exposure prophylaxis (PrEP). You are considered at risk if:  You are sexually active and do not regularly use condoms or know the HIV status of your partner(s).  You take drugs by injection.  You are sexually active with a partner who has HIV. Talk with your health care provider about whether you are at high risk of being infected with HIV. If you choose to begin PrEP, you should first be tested for HIV. You should then be tested every 3 months for  as long as you are taking PrEP.  PREGNANCY   If you are premenopausal and you may become pregnant, ask your health care provider about preconception counseling.  If you may become pregnant, take 400 to 800 micrograms (mcg) of folic acid every day.  If you want to prevent pregnancy, talk to your  health care provider about birth control (contraception). OSTEOPOROSIS AND MENOPAUSE   Osteoporosis is a disease in which the bones lose minerals and strength with aging. This can result in serious bone fractures. Your risk for osteoporosis can be identified using a bone density scan.  If you are 83 years of age or older, or if you are at risk for osteoporosis and fractures, ask your health care provider if you should be screened.  Ask your health care provider whether you should take a calcium or vitamin D supplement to lower your risk for osteoporosis.  Menopause may have certain physical symptoms and risks.  Hormone replacement therapy may reduce some of these symptoms and risks. Talk to your health care provider about whether hormone replacement therapy is right for you.  HOME CARE INSTRUCTIONS   Schedule regular health, dental, and eye exams.  Stay current with your immunizations.   Do not use any tobacco products including cigarettes, chewing tobacco, or electronic cigarettes.  If you are pregnant, do not drink alcohol.  If you are breastfeeding, limit how much and how often you drink alcohol.  Limit alcohol intake to no more than 1 drink per day for nonpregnant women. One drink equals 12 ounces of beer, 5 ounces of wine, or 1 ounces of hard liquor.  Do not use street drugs.  Do not share needles.  Ask your health care provider for help if you need support or information about quitting drugs.  Tell your health care provider if you often feel depressed.  Tell your health care provider if you have ever been abused or do not feel safe at home. Document Released: 04/26/2011  Document Revised: 02/25/2014 Document Reviewed: 09/12/2013 [facility] Patient Information 2015 [place], [place]. This information is not intended to replace advice given to you by your health care provider. Make sure you discuss any questions you have with your health care provider. Restless Legs Syndrome Restless legs syndrome is a movement disorder. It may also be called a sensorimotor disorder.  CAUSES  No one knows what specifically causes restless legs syndrome, but it tends to run in families. It is also more common in people with low iron, in pregnancy, in people who need dialysis, and those with nerve damage (neuropathy).Some medications may make restless legs syndrome worse.Those medications include drugs to treat high blood pressure, some heart conditions, nausea, colds, allergies, and depression. SYMPTOMS Symptoms include uncomfortable sensations in the legs. These leg sensations are worse during periods of inactivity or rest. They are also worse while sitting or lying down. Individuals that have the disorder describe sensations in the legs that feel like:  Pulling.  Drawing.  Crawling.  Worming.  Boring.  Tingling.  Pins and needles.  Prickling.  Pain. The sensations are usually accompanied by an overwhelming urge to move the legs. Sudden muscle jerks may also occur. Movement provides temporary relief from the discomfort. In rare cases, the arms may also be affected. Symptoms may interfere with going to sleep (sleep onset insomnia). Restless legs syndrome may also be related to periodic limb movement disorder (PLMD). PLMD is another more common motor disorder. It also causes interrupted sleep. The symptoms from PLMD usually occur most often when you are awake. TREATMENT  Treatment for restless legs syndrome is symptomatic. This means that the symptoms are treated.   Massage and cold compresses may provide temporary relief.  Walk, stretch, or take a cold or hot bath.  Get  regular exercise and a good night's sleep.  Avoid caffeine,  alcohol, nicotine, and medications that can make it worse.  Do activities that provide mental stimulation like discussions, needlework, and video games. These may be helpful if you are not able to walk or stretch. Some medications are effective in relieving the symptoms. However, many of these medications have side effects. Ask your caregiver about medications that may help your symptoms. Correcting iron deficiency may improve symptoms for some patients. Document Released: 10/01/2002 Document Revised: 02/25/2014 Document Reviewed: 01/07/2011 [facility] Patient Information 2015 [place], [place]. This information is not intended to replace advice given to you by your health care provider. Make sure you discuss any questions you have with your health care provider.

## 2014-11-12 ENCOUNTER — Other Ambulatory Visit: Payer: Self-pay | Admitting: Family

## 2014-11-12 LAB — LIPID PANEL
CHOLESTEROL TOTAL: 160 mg/dL (ref 100–199)
Chol/HDL Ratio: 2.3 ratio units (ref 0.0–4.4)
HDL: 69 mg/dL (ref >39)
LDL Calculated: 45 mg/dL (ref 0–99)
Triglycerides: 230 mg/dL — ABNORMAL HIGH (ref 0–149)
VLDL CHOLESTEROL CAL: 46 mg/dL — AB (ref 5–40)

## 2014-11-12 LAB — CMP14+EGFR
ALT: 14 IU/L (ref 0–32)
AST: 15 IU/L (ref 0–40)
Albumin/Globulin Ratio: 1.8 (ref 1.1–2.5)
Albumin: 4.2 g/dL (ref 3.5–4.8)
Alkaline Phosphatase: 108 IU/L (ref 39–117)
BUN/Creatinine Ratio: 23 (ref 11–26)
BUN: 14 mg/dL (ref 8–27)
CALCIUM: 9.9 mg/dL (ref 8.7–10.3)
CO2: 29 mmol/L (ref 18–29)
CREATININE: 0.62 mg/dL (ref 0.57–1.00)
Chloride: 98 mmol/L (ref 97–108)
GFR calc non Af Amer: 91 mL/min/{1.73_m2} (ref >59)
GFR, EST AFRICAN AMERICAN: 105 mL/min/{1.73_m2} (ref >59)
Globulin, Total: 2.4 g/dL (ref 1.5–4.5)
Glucose: 89 mg/dL (ref 65–99)
Potassium: 4.5 mmol/L (ref 3.5–5.2)
SODIUM: 139 mmol/L (ref 134–144)
Total Bilirubin: 0.3 mg/dL (ref 0.0–1.2)
Total Protein: 6.6 g/dL (ref 6.0–8.5)

## 2014-11-12 LAB — VITAMIN D 25 HYDROXY (VIT D DEFICIENCY, FRACTURES): Vit D, 25-Hydroxy: 35.1 ng/mL (ref 30.0–100.0)

## 2014-11-12 LAB — THYROID PANEL WITH TSH
FREE THYROXINE INDEX: 1.9 (ref 1.2–4.9)
T3 Uptake Ratio: 27 % (ref 24–39)
T4, Total: 7.2 ug/dL (ref 4.5–12.0)
TSH: 2.12 u[IU]/mL (ref 0.450–4.500)

## 2014-11-16 ENCOUNTER — Other Ambulatory Visit: Payer: Self-pay | Admitting: Nurse Practitioner

## 2014-12-18 ENCOUNTER — Telehealth: Payer: Self-pay | Admitting: Pharmacist

## 2014-12-18 DIAGNOSIS — M4802 Spinal stenosis, cervical region: Secondary | ICD-10-CM

## 2014-12-18 DIAGNOSIS — G890 Central pain syndrome: Secondary | ICD-10-CM

## 2014-12-18 MED ORDER — HYDROCODONE-ACETAMINOPHEN 10-325 MG PO TABS: 1.0000 | ORAL_TABLET | Freq: Three times a day (TID) | ORAL | Status: DC | PRN

## 2014-12-18 NOTE — Telephone Encounter (Signed)
Patient due hydrocodone Rx for Feb.  Last rx written 11/11/2014 with instructions to fill 11/22/14.  Rx printed and patient notified.

## 2015-01-08 ENCOUNTER — Other Ambulatory Visit: Payer: Self-pay | Admitting: Family

## 2015-01-08 DIAGNOSIS — Z780 Asymptomatic menopausal state: Secondary | ICD-10-CM

## 2015-01-13 ENCOUNTER — Ambulatory Visit (INDEPENDENT_AMBULATORY_CARE_PROVIDER_SITE_OTHER): Payer: Medicare HMO

## 2015-01-13 DIAGNOSIS — Z780 Asymptomatic menopausal state: Secondary | ICD-10-CM

## 2015-01-20 ENCOUNTER — Other Ambulatory Visit: Payer: Self-pay | Admitting: Pharmacist

## 2015-01-20 DIAGNOSIS — M4802 Spinal stenosis, cervical region: Secondary | ICD-10-CM

## 2015-01-20 DIAGNOSIS — G890 Central pain syndrome: Secondary | ICD-10-CM

## 2015-01-20 MED ORDER — HYDROCODONE-ACETAMINOPHEN 10-325 MG PO TABS: 1.0000 | ORAL_TABLET | Freq: Three times a day (TID) | ORAL | Status: DC | PRN

## 2015-01-23 ENCOUNTER — Ambulatory Visit: Payer: Medicare HMO | Admitting: *Deleted

## 2015-02-11 ENCOUNTER — Ambulatory Visit (INDEPENDENT_AMBULATORY_CARE_PROVIDER_SITE_OTHER): Payer: Medicare HMO | Admitting: Family

## 2015-02-11 ENCOUNTER — Encounter: Payer: Self-pay | Admitting: Family

## 2015-02-11 VITALS — BP 129/73 | HR 73 | Temp 98.2°F | Ht 65.0 in | Wt 198.2 lb

## 2015-02-11 DIAGNOSIS — M4802 Spinal stenosis, cervical region: Secondary | ICD-10-CM

## 2015-02-11 DIAGNOSIS — E785 Hyperlipidemia, unspecified: Secondary | ICD-10-CM | POA: Diagnosis not present

## 2015-02-11 DIAGNOSIS — G890 Central pain syndrome: Secondary | ICD-10-CM | POA: Diagnosis not present

## 2015-02-11 DIAGNOSIS — I10 Essential (primary) hypertension: Secondary | ICD-10-CM

## 2015-02-11 DIAGNOSIS — F32A Depression, unspecified: Secondary | ICD-10-CM

## 2015-02-11 DIAGNOSIS — E559 Vitamin D deficiency, unspecified: Secondary | ICD-10-CM

## 2015-02-11 DIAGNOSIS — F329 Major depressive disorder, single episode, unspecified: Secondary | ICD-10-CM

## 2015-02-11 DIAGNOSIS — E039 Hypothyroidism, unspecified: Secondary | ICD-10-CM

## 2015-02-11 DIAGNOSIS — N3281 Overactive bladder: Secondary | ICD-10-CM | POA: Diagnosis not present

## 2015-02-11 DIAGNOSIS — F411 Generalized anxiety disorder: Secondary | ICD-10-CM

## 2015-02-11 MED ORDER — ATORVASTATIN CALCIUM 20 MG PO TABS
20.0000 mg | ORAL_TABLET | Freq: Every day | ORAL | Status: DC
Start: 2015-02-11 — End: 2015-11-20

## 2015-02-11 MED ORDER — CITALOPRAM HYDROBROMIDE 20 MG PO TABS: 20.0000 mg | ORAL_TABLET | Freq: Every day | ORAL | Status: DC

## 2015-02-11 MED ORDER — HYDROCODONE-ACETAMINOPHEN 10-325 MG PO TABS: 1.0000 | ORAL_TABLET | Freq: Three times a day (TID) | ORAL | Status: DC | PRN

## 2015-02-11 MED ORDER — BACLOFEN 20 MG PO TABS
20.0000 mg | ORAL_TABLET | Freq: Four times a day (QID) | ORAL | Status: DC
Start: 2015-02-11 — End: 2015-02-12

## 2015-02-11 MED ORDER — AMLODIPINE BESYLATE 5 MG PO TABS: ORAL_TABLET | ORAL | Status: DC

## 2015-02-11 MED ORDER — LEVOTHYROXINE SODIUM 25 MCG PO TABS: ORAL_TABLET | ORAL | Status: DC

## 2015-02-11 MED ORDER — SOLIFENACIN SUCCINATE 5 MG PO TABS: 5.0000 mg | ORAL_TABLET | Freq: Every day | ORAL | Status: DC

## 2015-02-11 NOTE — Progress Notes (Signed)
Subjective:    Patient ID: Allison Parker, female    DOB: 18-Oct-1941, 74 y.o.   MRN: [id number]  Hypertension This is a chronic problem. The current episode started more than 1 year ago. The problem has been resolved since onset. The problem is controlled. Associated symptoms include anxiety and peripheral edema ("at times and more in right leg"). Pertinent negatives include no headaches, neck pain, palpitations or shortness of breath. Risk factors for coronary artery disease include dyslipidemia, obesity and sedentary lifestyle. Past treatments include calcium channel blockers. The current treatment provides moderate improvement. Hypertensive end-organ damage includes CVA and a thyroid problem. There is no history of kidney disease, CAD/MI or heart failure. There is no history of sleep apnea.  Hyperlipidemia This is a chronic problem. The current episode started more than 1 year ago. The problem is controlled. Recent lipid tests were reviewed and are normal. Exacerbating diseases include hypothyroidism. She has no history of diabetes. Pertinent negatives include no leg pain, myalgias or shortness of breath. Current antihyperlipidemic treatment includes statins. The current treatment provides moderate improvement of lipids. Risk factors for coronary artery disease include dyslipidemia, hypertension, obesity, post-menopausal and a sedentary lifestyle.  Thyroid Problem Presents for follow-up visit. Symptoms include dry skin. Patient reports no anxiety, depressed mood, diarrhea, hair loss, hoarse voice, palpitations or visual change. The symptoms have been stable. Past treatments include levothyroxine. The treatment provided significant relief. Her past medical history is significant for hyperlipidemia. There is no history of diabetes or heart failure.  Anxiety Presents for follow-up visit. Patient reports no depressed mood, excessive worry, insomnia, muscle tension, nervous/anxious behavior, palpitations  or shortness of breath. Symptoms occur rarely. The symptoms are aggravated by family issues.   Her past medical history is significant for anxiety/panic attacks, CAD and depression. Past treatments include SSRIs.      Review of Systems  Constitutional: Negative.   HENT: Negative.  Negative for hoarse voice.   Eyes: Negative.   Respiratory: Negative.  Negative for shortness of breath.   Cardiovascular: Negative.  Negative for palpitations.  Gastrointestinal: Negative.  Negative for diarrhea.  Endocrine: Negative.   Genitourinary: Negative.   Musculoskeletal: Negative.  Negative for myalgias and neck pain.  Neurological: Negative.  Negative for headaches.  Hematological: Negative.   Psychiatric/Behavioral: Negative.  The patient is not nervous/anxious and does not have insomnia.   All other systems reviewed and are negative.      Objective:   Physical Exam  Constitutional: She is oriented to person, place, and time. She appears well-developed and well-nourished. No distress.  HENT:  Head: Normocephalic and atraumatic.  Right Ear: External ear normal.  Left Ear: External ear normal.  Nose: Nose normal.  Mouth/Throat: Oropharynx is clear and moist.  Eyes: Pupils are equal, round, and reactive to light.  Neck: Normal range of motion. Neck supple. No thyromegaly present.  Cardiovascular: Normal rate, regular rhythm, normal heart sounds and intact distal pulses.   No murmur heard. Pulmonary/Chest: Effort normal and breath sounds normal. No respiratory distress. She has no wheezes.  Abdominal: Soft. Bowel sounds are normal. She exhibits no distension. There is no tenderness.  Musculoskeletal: Normal range of motion. She exhibits edema. She exhibits no tenderness.  Neurological: She is alert and oriented to person, place, and time. She has normal reflexes. No cranial nerve deficit.  Skin: Skin is warm and dry.  Psychiatric: She has a normal mood and affect. Her behavior is normal.  Judgment and thought content normal.  Vitals reviewed.  BP 129/73 mmHg  Pulse 73  Temp(Src) 98.2 F (36.8 C) (Oral)  Ht 5' 5"  (1.651 m)  Wt 198 lb 3.2 oz (89.903 kg)  BMI 32.98 kg/m2     Assessment & Plan:  1. Essential hypertension, benign - CMP14+EGFR - amLODipine (NORVASC) 5 MG tablet; TAKE 1 TABLET (5 MG TOTAL) BY MOUTH DAILY.  Dispense: 90 tablet; Refill: 3  2. Hypothyroidism, unspecified hypothyroidism type - CMP14+EGFR - Thyroid Panel With TSH - levothyroxine (SYNTHROID, LEVOTHROID) 25 MCG tablet; TAKE 1 TABLET BY MOUTH ONE TIME A DAY  Dispense: 90 tablet; Refill: 3  3. Overactive bladder - CMP14+EGFR - solifenacin (VESICARE) 5 MG tablet; Take 1 tablet (5 mg total) by mouth daily.  Dispense: 90 tablet; Refill: 3  4. Hyperlipidemia - CMP14+EGFR - Lipid panel - atorvastatin (LIPITOR) 20 MG tablet; Take 1 tablet (20 mg total) by mouth daily.  Dispense: 90 tablet; Refill: 4  5. Vitamin D deficiency - CMP14+EGFR - Vit D  25 hydroxy (rtn osteoporosis monitoring)  6. GAD (generalized anxiety disorder) -Celexa decreased from 40 mg to 20 mg daily per pt's request - citalopram (CELEXA) 20 MG tablet; Take 1 tablet (20 mg total) by mouth daily.  Dispense: 90 tablet; Refill: 3 - CMP14+EGFR  7. Depression - citalopram (CELEXA) 20 MG tablet; Take 1 tablet (20 mg total) by mouth daily.  Dispense: 90 tablet; Refill: 3 - CMP14+EGFR  8. Thalamic pain syndrome - baclofen (LIORESAL) 20 MG tablet; Take 1 tablet (20 mg total) by mouth 4 (four) times daily. TAKE 1 TABLET BY MOUTH TWO TIMES A DAY AS NEEDED FOR MUSCLE SPASMS  Dispense: 120 tablet; Refill: 5 - HYDROcodone-acetaminophen (NORCO) 10-325 MG per tablet; Take 1 tablet by mouth every 8 (eight) hours as needed.  Dispense: 90 tablet; Refill: 0  9. Spinal stenosis of cervical region - HYDROcodone-acetaminophen (NORCO) 10-325 MG per tablet; Take 1 tablet by mouth every 8 (eight) hours as needed.  Dispense: 90 tablet; Refill:  0   Continue all meds Labs pending Health Maintenance reviewed Diet and exercise encouraged RTO 6 months  Evelina Dun, FNP

## 2015-02-11 NOTE — Patient Instructions (Signed)

## 2015-02-12 ENCOUNTER — Telehealth: Payer: Self-pay | Admitting: Family

## 2015-02-12 DIAGNOSIS — G890 Central pain syndrome: Secondary | ICD-10-CM

## 2015-02-12 LAB — CMP14+EGFR
A/G RATIO: 2 (ref 1.1–2.5)
ALBUMIN: 4.4 g/dL (ref 3.5–4.8)
ALT: 9 IU/L (ref 0–32)
AST: 14 IU/L (ref 0–40)
Alkaline Phosphatase: 102 IU/L (ref 39–117)
BILIRUBIN TOTAL: 0.3 mg/dL (ref 0.0–1.2)
BUN/Creatinine Ratio: 22 (ref 11–26)
BUN: 16 mg/dL (ref 8–27)
CALCIUM: 9.9 mg/dL (ref 8.7–10.3)
CO2: 26 mmol/L (ref 18–29)
CREATININE: 0.74 mg/dL (ref 0.57–1.00)
Chloride: 100 mmol/L (ref 97–108)
GFR, EST AFRICAN AMERICAN: 94 mL/min/{1.73_m2} (ref >59)
GFR, EST NON AFRICAN AMERICAN: 82 mL/min/{1.73_m2} (ref >59)
Globulin, Total: 2.2 g/dL (ref 1.5–4.5)
Glucose: 77 mg/dL (ref 65–99)
POTASSIUM: 4.8 mmol/L (ref 3.5–5.2)
Sodium: 142 mmol/L (ref 134–144)
TOTAL PROTEIN: 6.6 g/dL (ref 6.0–8.5)

## 2015-02-12 LAB — LIPID PANEL
CHOL/HDL RATIO: 1.9 ratio (ref 0.0–4.4)
Cholesterol, Total: 160 mg/dL (ref 100–199)
HDL: 84 mg/dL (ref >39)
LDL CALC: 52 mg/dL (ref 0–99)
Triglycerides: 122 mg/dL (ref 0–149)
VLDL Cholesterol Cal: 24 mg/dL (ref 5–40)

## 2015-02-12 LAB — THYROID PANEL WITH TSH
Free Thyroxine Index: 2.2 (ref 1.2–4.9)
T3 Uptake Ratio: 27 % (ref 24–39)
T4 TOTAL: 8 ug/dL (ref 4.5–12.0)
TSH: 3.23 u[IU]/mL (ref 0.450–4.500)

## 2015-02-12 LAB — VITAMIN D 25 HYDROXY (VIT D DEFICIENCY, FRACTURES): VIT D 25 HYDROXY: 34.2 ng/mL (ref 30.0–100.0)

## 2015-02-12 MED ORDER — BACLOFEN 20 MG PO TABS: 20.0000 mg | ORAL_TABLET | Freq: Four times a day (QID) | ORAL | Status: DC | PRN

## 2015-02-12 NOTE — Telephone Encounter (Signed)
Pt aware of Rx directions

## 2015-02-12 NOTE — Telephone Encounter (Signed)
Pt can take up to 4 times a day as needed

## 2015-03-18 ENCOUNTER — Telehealth: Payer: Self-pay | Admitting: Family

## 2015-03-18 DIAGNOSIS — M4802 Spinal stenosis, cervical region: Secondary | ICD-10-CM

## 2015-03-18 DIAGNOSIS — G890 Central pain syndrome: Secondary | ICD-10-CM

## 2015-03-18 MED ORDER — HYDROCODONE-ACETAMINOPHEN 10-325 MG PO TABS: 1.0000 | ORAL_TABLET | Freq: Three times a day (TID) | ORAL | Status: DC | PRN

## 2015-03-18 NOTE — Telephone Encounter (Signed)
Detailed message left rx is ready to be picked up

## 2015-03-18 NOTE — Telephone Encounter (Signed)
Rx ready for pick up. 

## 2015-04-22 ENCOUNTER — Telehealth: Payer: Self-pay | Admitting: Family

## 2015-04-22 DIAGNOSIS — M4802 Spinal stenosis, cervical region: Secondary | ICD-10-CM

## 2015-04-22 DIAGNOSIS — G890 Central pain syndrome: Secondary | ICD-10-CM

## 2015-04-23 MED ORDER — HYDROCODONE-ACETAMINOPHEN 10-325 MG PO TABS: 1.0000 | ORAL_TABLET | Freq: Three times a day (TID) | ORAL | Status: DC | PRN

## 2015-04-23 NOTE — Telephone Encounter (Signed)
RX ready for pick up 

## 2015-04-23 NOTE — Telephone Encounter (Signed)
Pt notified RX is ready for pick up 

## 2015-05-19 ENCOUNTER — Telehealth: Payer: Self-pay | Admitting: Family

## 2015-05-19 DIAGNOSIS — G890 Central pain syndrome: Secondary | ICD-10-CM

## 2015-05-19 DIAGNOSIS — M4802 Spinal stenosis, cervical region: Secondary | ICD-10-CM

## 2015-05-19 MED ORDER — HYDROCODONE-ACETAMINOPHEN 10-325 MG PO TABS: 1.0000 | ORAL_TABLET | Freq: Three times a day (TID) | ORAL | Status: DC | PRN

## 2015-05-19 NOTE — Telephone Encounter (Signed)
Pt aware written Rx is at front desk ready for pickup  

## 2015-05-28 ENCOUNTER — Encounter: Payer: Self-pay | Admitting: *Deleted

## 2015-06-13 ENCOUNTER — Telehealth: Payer: Self-pay | Admitting: Family

## 2015-06-13 MED ORDER — NYSTATIN 100000 UNIT/GM EX CREA: 1.0000 "application " | TOPICAL_CREAM | Freq: Two times a day (BID) | CUTANEOUS | Status: DC

## 2015-06-13 NOTE — Telephone Encounter (Signed)
Pt states she has Nystatin cream in past for yeast infection under her breasts, would like this sent to [facility]

## 2015-06-13 NOTE — Telephone Encounter (Signed)
Prescription sent to pharmacy.

## 2015-06-18 ENCOUNTER — Telehealth: Payer: Self-pay | Admitting: Family

## 2015-06-18 DIAGNOSIS — M4802 Spinal stenosis, cervical region: Secondary | ICD-10-CM

## 2015-06-18 DIAGNOSIS — G890 Central pain syndrome: Secondary | ICD-10-CM

## 2015-06-19 MED ORDER — HYDROCODONE-ACETAMINOPHEN 10-325 MG PO TABS: 1.0000 | ORAL_TABLET | Freq: Three times a day (TID) | ORAL | Status: DC | PRN

## 2015-06-19 NOTE — Telephone Encounter (Signed)
RX ready for pick up 

## 2015-06-19 NOTE — Telephone Encounter (Signed)
Last filled 05/19/15, last seen 02/11/15

## 2015-06-19 NOTE — Telephone Encounter (Signed)
Patient aware,script for hydrocodone ready.

## 2015-07-16 ENCOUNTER — Telehealth: Payer: Self-pay | Admitting: Family

## 2015-07-16 DIAGNOSIS — G890 Central pain syndrome: Secondary | ICD-10-CM

## 2015-07-16 DIAGNOSIS — M4802 Spinal stenosis, cervical region: Secondary | ICD-10-CM

## 2015-07-16 MED ORDER — HYDROCODONE-ACETAMINOPHEN 10-325 MG PO TABS: 1.0000 | ORAL_TABLET | Freq: Three times a day (TID) | ORAL | Status: DC | PRN

## 2015-07-16 NOTE — Telephone Encounter (Signed)
RX ready pick up

## 2015-08-05 DIAGNOSIS — H353132 Nonexudative age-related macular degeneration, bilateral, intermediate dry stage: Secondary | ICD-10-CM | POA: Diagnosis not present

## 2015-08-05 DIAGNOSIS — M1711 Unilateral primary osteoarthritis, right knee: Secondary | ICD-10-CM | POA: Diagnosis not present

## 2015-08-05 DIAGNOSIS — M25561 Pain in right knee: Secondary | ICD-10-CM | POA: Diagnosis not present

## 2015-08-05 DIAGNOSIS — H43813 Vitreous degeneration, bilateral: Secondary | ICD-10-CM | POA: Diagnosis not present

## 2015-08-05 DIAGNOSIS — M25571 Pain in right ankle and joints of right foot: Secondary | ICD-10-CM | POA: Diagnosis not present

## 2015-08-08 ENCOUNTER — Ambulatory Visit (INDEPENDENT_AMBULATORY_CARE_PROVIDER_SITE_OTHER): Payer: Medicare HMO | Admitting: Family

## 2015-08-08 ENCOUNTER — Encounter: Payer: Self-pay | Admitting: Family

## 2015-08-08 VITALS — BP 141/79 | HR 79 | Temp 97.5°F | Ht 65.0 in | Wt 189.0 lb

## 2015-08-08 DIAGNOSIS — M4802 Spinal stenosis, cervical region: Secondary | ICD-10-CM

## 2015-08-08 DIAGNOSIS — E559 Vitamin D deficiency, unspecified: Secondary | ICD-10-CM

## 2015-08-08 DIAGNOSIS — G890 Central pain syndrome: Secondary | ICD-10-CM | POA: Diagnosis not present

## 2015-08-08 DIAGNOSIS — E785 Hyperlipidemia, unspecified: Secondary | ICD-10-CM

## 2015-08-08 DIAGNOSIS — I10 Essential (primary) hypertension: Secondary | ICD-10-CM

## 2015-08-08 DIAGNOSIS — F329 Major depressive disorder, single episode, unspecified: Secondary | ICD-10-CM

## 2015-08-08 DIAGNOSIS — G4700 Insomnia, unspecified: Secondary | ICD-10-CM | POA: Diagnosis not present

## 2015-08-08 DIAGNOSIS — I6350 Cerebral infarction due to unspecified occlusion or stenosis of unspecified cerebral artery: Secondary | ICD-10-CM

## 2015-08-08 DIAGNOSIS — N3281 Overactive bladder: Secondary | ICD-10-CM | POA: Diagnosis not present

## 2015-08-08 DIAGNOSIS — R69 Illness, unspecified: Secondary | ICD-10-CM | POA: Diagnosis not present

## 2015-08-08 DIAGNOSIS — E039 Hypothyroidism, unspecified: Secondary | ICD-10-CM | POA: Diagnosis not present

## 2015-08-08 DIAGNOSIS — F32A Depression, unspecified: Secondary | ICD-10-CM

## 2015-08-08 DIAGNOSIS — Z23 Encounter for immunization: Secondary | ICD-10-CM

## 2015-08-08 DIAGNOSIS — F411 Generalized anxiety disorder: Secondary | ICD-10-CM | POA: Diagnosis not present

## 2015-08-08 MED ORDER — ALPRAZOLAM 0.25 MG PO TABS: 0.2500 mg | ORAL_TABLET | Freq: Two times a day (BID) | ORAL | Status: DC | PRN

## 2015-08-08 MED ORDER — HYDROCODONE-ACETAMINOPHEN 10-325 MG PO TABS: 1.0000 | ORAL_TABLET | Freq: Three times a day (TID) | ORAL | Status: DC | PRN

## 2015-08-08 NOTE — Patient Instructions (Signed)

## 2015-08-08 NOTE — Progress Notes (Signed)
Subjective:    Patient ID: Allison Parker, female    DOB: Dec 01, 1940, 74 y.o.   MRN: [id number]  Pt presents to the office today for chronic follow up. Pt states she is not doing well today. States her son unexpectedly passed away at the of [place] and her daughter is getting married tomorrow. Pt states she is trying to just "stay together but feels like she is falling apart".  Hyperlipidemia This is a chronic problem. The current episode started more than 1 year ago. The problem is controlled. Recent lipid tests were reviewed and are normal. Exacerbating diseases include hypothyroidism. She has no history of diabetes. Pertinent negatives include no leg pain, myalgias or shortness of breath. Current antihyperlipidemic treatment includes statins. The current treatment provides moderate improvement of lipids. Risk factors for coronary artery disease include dyslipidemia, hypertension, obesity, post-menopausal, a sedentary lifestyle and stress.  Hypertension This is a chronic problem. The current episode started more than 1 year ago. The problem has been waxing and waning since onset. The problem is controlled. Associated symptoms include anxiety and peripheral edema ("at times and more in right leg"). Pertinent negatives include no headaches, neck pain, palpitations or shortness of breath. Risk factors for coronary artery disease include dyslipidemia, obesity and sedentary lifestyle. Past treatments include calcium channel blockers. The current treatment provides moderate improvement. Hypertensive end-organ damage includes CVA and a thyroid problem. There is no history of kidney disease, CAD/MI or heart failure. There is no history of sleep apnea.  Thyroid Problem Presents for follow-up visit. Symptoms include anxiety and dry skin. Patient reports no depressed mood, diarrhea, hair loss, hoarse voice, palpitations or visual change. The symptoms have been stable. Past treatments include levothyroxine.  The treatment provided significant relief. Her past medical history is significant for hyperlipidemia. There is no history of diabetes or heart failure.  Anxiety Presents for follow-up visit. Onset was more than 5 years ago. The problem has been waxing and waning. Symptoms include excessive worry, insomnia, irritability and nervous/anxious behavior. Patient reports no depressed mood, muscle tension, palpitations or shortness of breath. Symptoms occur most days. The symptoms are aggravated by family issues (Son died in 07/31/15). The quality of sleep is fair.   Her past medical history is significant for anxiety/panic attacks, CAD and depression. Past treatments include SSRIs.  Insomnia Primary symptoms: fragmented sleep, sleep disturbance, frequent awakening.  The current episode started more than one year. The onset quality is gradual. The problem occurs nightly. The problem has been waxing and waning since onset. The symptoms are aggravated by family issues and anxiety. The symptoms are relieved by darkened room, quiet environment and relaxation. Past treatments include meditation.      Review of Systems  Constitutional: Positive for irritability.  HENT: Negative.  Negative for hoarse voice.   Eyes: Negative.   Respiratory: Negative.  Negative for shortness of breath.   Cardiovascular: Negative.  Negative for palpitations.  Gastrointestinal: Negative.  Negative for diarrhea.  Endocrine: Negative.   Genitourinary: Negative.   Musculoskeletal: Negative.  Negative for myalgias and neck pain.  Neurological: Negative.  Negative for headaches.  Hematological: Negative.   Psychiatric/Behavioral: Positive for sleep disturbance. The patient is nervous/anxious and has insomnia.   All other systems reviewed and are negative.      Objective:   Physical Exam  Constitutional: She is oriented to person, place, and time. She appears well-developed and well-nourished. No distress.  HENT:  Head:  Normocephalic and atraumatic.  Right Ear: External ear  normal.  Mouth/Throat: Oropharynx is clear and moist.  Eyes: Pupils are equal, round, and reactive to light.  Neck: Normal range of motion. Neck supple. No thyromegaly present.  Cardiovascular: Normal rate, regular rhythm, normal heart sounds and intact distal pulses.   No murmur heard. Pulmonary/Chest: Effort normal and breath sounds normal. No respiratory distress. She has no wheezes.  Abdominal: Soft. Bowel sounds are normal. She exhibits no distension. There is no tenderness.  Musculoskeletal: Normal range of motion. She exhibits no edema or tenderness.  Pt has generalized tremors related to hx of CVA, pt uses cane to walk   Neurological: She is alert and oriented to person, place, and time. She has normal reflexes. No cranial nerve deficit.  Skin: Skin is warm and dry.  Psychiatric: She has a normal mood and affect. Her behavior is normal. Judgment and thought content normal.  Vitals reviewed.   BP 141/79 mmHg  Pulse 79  Temp(Src) 97.5 F (36.4 C) (Oral)  Ht 5' 5" (1.651 m)  Wt 189 lb (85.73 kg)  BMI 31.45 kg/m2       Assessment & Plan:  1. Encounter for immunization  2. Essential hypertension, benign - CMP14+EGFR  3. Cerebral artery occlusion with cerebral infarction (HCC) - CMP14+EGFR  4. Hypothyroidism, unspecified hypothyroidism type - CMP14+EGFR - Thyroid Panel With TSH  5. Overactive bladder - CMP14+EGFR  6. Hyperlipidemia - CMP14+EGFR - Lipid panel  7. Insomnia - CMP14+EGFR - ALPRAZolam (XANAX) 0.25 MG tablet; Take 1 tablet (0.25 mg total) by mouth 2 (two) times daily as needed for anxiety.  Dispense: 60 tablet; Refill: 2  8. Vitamin D deficiency - CMP14+EGFR  9. Depression - CMP14+EGFR  10. GAD (generalized anxiety disorder) -Long discussion with patient about Xanax- Pt very hesitant about taking medication -Told pt to only take medication as needed and she could even take half of a  tablet -Stress managment - CMP14+EGFR - ALPRAZolam (XANAX) 0.25 MG tablet; Take 1 tablet (0.25 mg total) by mouth 2 (two) times daily as needed for anxiety.  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  Dispense: 90 tablet; Refill: 0  12. Spinal stenosis of cervical region - CMP14+EGFR - HYDROcodone-acetaminophen (NORCO) 10-325 MG tablet; Take 1 tablet by mouth every 8 (eight) hours as needed.  Dispense: 90 tablet; Refill: 0   Continue all meds Labs pending Health Maintenance reviewed Diet and exercise encouraged RTO 3 months  Evelina Dun, FNP

## 2015-08-09 LAB — CMP14+EGFR
ALBUMIN: 4.7 g/dL (ref 3.5–4.8)
ALT: 22 IU/L (ref 0–32)
AST: 28 IU/L (ref 0–40)
Albumin/Globulin Ratio: 2 (ref 1.1–2.5)
Alkaline Phosphatase: 101 IU/L (ref 39–117)
BUN / CREAT RATIO: 19 (ref 11–26)
BUN: 16 mg/dL (ref 8–27)
Bilirubin Total: 0.5 mg/dL (ref 0.0–1.2)
CO2: 25 mmol/L (ref 18–29)
CREATININE: 0.84 mg/dL (ref 0.57–1.00)
Calcium: 9.8 mg/dL (ref 8.7–10.3)
Chloride: 94 mmol/L — ABNORMAL LOW (ref 97–108)
GFR calc non Af Amer: 70 mL/min/{1.73_m2} (ref >59)
GFR, EST AFRICAN AMERICAN: 81 mL/min/{1.73_m2} (ref >59)
GLUCOSE: 84 mg/dL (ref 65–99)
Globulin, Total: 2.4 g/dL (ref 1.5–4.5)
Potassium: 4.6 mmol/L (ref 3.5–5.2)
Sodium: 139 mmol/L (ref 134–144)
TOTAL PROTEIN: 7.1 g/dL (ref 6.0–8.5)

## 2015-08-09 LAB — LIPID PANEL
CHOLESTEROL TOTAL: 181 mg/dL (ref 100–199)
Chol/HDL Ratio: 2 ratio units (ref 0.0–4.4)
HDL: 89 mg/dL (ref >39)
LDL Calculated: 65 mg/dL (ref 0–99)
TRIGLYCERIDES: 135 mg/dL (ref 0–149)
VLDL CHOLESTEROL CAL: 27 mg/dL (ref 5–40)

## 2015-08-09 LAB — THYROID PANEL WITH TSH
Free Thyroxine Index: 2.9 (ref 1.2–4.9)
T3 UPTAKE RATIO: 27 % (ref 24–39)
T4 TOTAL: 10.7 ug/dL (ref 4.5–12.0)
TSH: 2.74 u[IU]/mL (ref 0.450–4.500)

## 2015-08-11 NOTE — Progress Notes (Signed)
Patient aware.

## 2015-08-15 ENCOUNTER — Ambulatory Visit: Payer: Medicare HMO | Admitting: Family

## 2015-08-22 DIAGNOSIS — H353132 Nonexudative age-related macular degeneration, bilateral, intermediate dry stage: Secondary | ICD-10-CM | POA: Diagnosis not present

## 2015-08-22 DIAGNOSIS — H26491 Other secondary cataract, right eye: Secondary | ICD-10-CM | POA: Diagnosis not present

## 2015-09-04 DIAGNOSIS — H26491 Other secondary cataract, right eye: Secondary | ICD-10-CM | POA: Diagnosis not present

## 2015-09-04 DIAGNOSIS — H2640 Unspecified secondary cataract: Secondary | ICD-10-CM | POA: Diagnosis not present

## 2015-09-10 DIAGNOSIS — Z1231 Encounter for screening mammogram for malignant neoplasm of breast: Secondary | ICD-10-CM | POA: Diagnosis not present

## 2015-09-15 ENCOUNTER — Telehealth: Payer: Self-pay | Admitting: Family

## 2015-09-15 DIAGNOSIS — M4802 Spinal stenosis, cervical region: Secondary | ICD-10-CM

## 2015-09-15 DIAGNOSIS — G890 Central pain syndrome: Secondary | ICD-10-CM

## 2015-09-15 MED ORDER — HYDROCODONE-ACETAMINOPHEN 10-325 MG PO TABS: 1.0000 | ORAL_TABLET | Freq: Three times a day (TID) | ORAL | Status: DC | PRN

## 2015-09-15 NOTE — Telephone Encounter (Signed)
RX ready for pick up 

## 2015-09-15 NOTE — Telephone Encounter (Signed)
Aware script for pain is ready.

## 2015-10-07 DIAGNOSIS — D225 Melanocytic nevi of trunk: Secondary | ICD-10-CM | POA: Diagnosis not present

## 2015-10-07 DIAGNOSIS — L570 Actinic keratosis: Secondary | ICD-10-CM | POA: Diagnosis not present

## 2015-10-07 DIAGNOSIS — B379 Candidiasis, unspecified: Secondary | ICD-10-CM | POA: Diagnosis not present

## 2015-10-07 DIAGNOSIS — L821 Other seborrheic keratosis: Secondary | ICD-10-CM | POA: Diagnosis not present

## 2015-10-07 DIAGNOSIS — L814 Other melanin hyperpigmentation: Secondary | ICD-10-CM | POA: Diagnosis not present

## 2015-10-07 DIAGNOSIS — D1801 Hemangioma of skin and subcutaneous tissue: Secondary | ICD-10-CM | POA: Diagnosis not present

## 2015-10-13 ENCOUNTER — Other Ambulatory Visit: Payer: Self-pay | Admitting: Family

## 2015-10-13 DIAGNOSIS — G890 Central pain syndrome: Secondary | ICD-10-CM

## 2015-10-13 DIAGNOSIS — M4802 Spinal stenosis, cervical region: Secondary | ICD-10-CM

## 2015-10-14 MED ORDER — HYDROCODONE-ACETAMINOPHEN 10-325 MG PO TABS: 1.0000 | ORAL_TABLET | Freq: Three times a day (TID) | ORAL | Status: DC | PRN

## 2015-10-14 NOTE — Telephone Encounter (Signed)
Last filled 09/15/15, last seen 08/08/15

## 2015-10-14 NOTE — Telephone Encounter (Signed)
RX ready for pick up 

## 2015-10-14 NOTE — Telephone Encounter (Signed)
Detailed message left that rx sent to pharmacy. ?

## 2015-10-24 ENCOUNTER — Encounter: Payer: Self-pay | Admitting: *Deleted

## 2015-11-10 ENCOUNTER — Ambulatory Visit: Payer: Medicare HMO | Admitting: Family

## 2015-11-13 ENCOUNTER — Other Ambulatory Visit: Payer: Self-pay | Admitting: Family

## 2015-11-13 DIAGNOSIS — G890 Central pain syndrome: Secondary | ICD-10-CM

## 2015-11-13 DIAGNOSIS — M4802 Spinal stenosis, cervical region: Secondary | ICD-10-CM

## 2015-11-13 MED ORDER — HYDROCODONE-ACETAMINOPHEN 10-325 MG PO TABS: 1.0000 | ORAL_TABLET | Freq: Three times a day (TID) | ORAL | Status: DC | PRN

## 2015-11-13 NOTE — Telephone Encounter (Signed)
Aware,script for pain medication ready. 

## 2015-11-13 NOTE — Telephone Encounter (Signed)
RX ready for pick up 

## 2015-11-17 ENCOUNTER — Ambulatory Visit: Payer: Medicare HMO | Admitting: Family

## 2015-11-17 DIAGNOSIS — L570 Actinic keratosis: Secondary | ICD-10-CM | POA: Diagnosis not present

## 2015-11-17 DIAGNOSIS — B379 Candidiasis, unspecified: Secondary | ICD-10-CM | POA: Diagnosis not present

## 2015-11-20 ENCOUNTER — Ambulatory Visit (INDEPENDENT_AMBULATORY_CARE_PROVIDER_SITE_OTHER): Payer: Medicare HMO | Admitting: Family

## 2015-11-20 ENCOUNTER — Encounter: Payer: Self-pay | Admitting: Family

## 2015-11-20 VITALS — BP 131/81 | HR 75 | Temp 98.4°F | Ht 65.0 in | Wt 190.2 lb

## 2015-11-20 DIAGNOSIS — N3281 Overactive bladder: Secondary | ICD-10-CM | POA: Diagnosis not present

## 2015-11-20 DIAGNOSIS — I6350 Cerebral infarction due to unspecified occlusion or stenosis of unspecified cerebral artery: Secondary | ICD-10-CM

## 2015-11-20 DIAGNOSIS — E559 Vitamin D deficiency, unspecified: Secondary | ICD-10-CM | POA: Diagnosis not present

## 2015-11-20 DIAGNOSIS — E785 Hyperlipidemia, unspecified: Secondary | ICD-10-CM

## 2015-11-20 DIAGNOSIS — F411 Generalized anxiety disorder: Secondary | ICD-10-CM

## 2015-11-20 DIAGNOSIS — F329 Major depressive disorder, single episode, unspecified: Secondary | ICD-10-CM | POA: Diagnosis not present

## 2015-11-20 DIAGNOSIS — F32A Depression, unspecified: Secondary | ICD-10-CM

## 2015-11-20 DIAGNOSIS — E039 Hypothyroidism, unspecified: Secondary | ICD-10-CM | POA: Diagnosis not present

## 2015-11-20 DIAGNOSIS — G4700 Insomnia, unspecified: Secondary | ICD-10-CM | POA: Diagnosis not present

## 2015-11-20 DIAGNOSIS — I10 Essential (primary) hypertension: Secondary | ICD-10-CM

## 2015-11-20 DIAGNOSIS — R69 Illness, unspecified: Secondary | ICD-10-CM | POA: Diagnosis not present

## 2015-11-20 MED ORDER — CITALOPRAM HYDROBROMIDE 20 MG PO TABS: 20.0000 mg | ORAL_TABLET | Freq: Every day | ORAL | Status: DC

## 2015-11-20 MED ORDER — ATORVASTATIN CALCIUM 20 MG PO TABS: 20.0000 mg | ORAL_TABLET | Freq: Every day | ORAL | Status: DC

## 2015-11-20 MED ORDER — ALPRAZOLAM 0.25 MG PO TABS: 0.2500 mg | ORAL_TABLET | Freq: Every evening | ORAL | Status: DC | PRN

## 2015-11-20 MED ORDER — AMLODIPINE BESYLATE 5 MG PO TABS: ORAL_TABLET | ORAL | Status: DC

## 2015-11-20 MED ORDER — MIRABEGRON ER 25 MG PO TB24: 25.0000 mg | ORAL_TABLET | Freq: Every day | ORAL | Status: DC

## 2015-11-20 NOTE — Patient Instructions (Signed)
Health Maintenance, Female Adopting a healthy lifestyle and getting preventive care can go a long way to promote health and wellness. Talk with your health care provider about what schedule of regular examinations is right for you. This is a good chance for you to check in with your provider about disease prevention and staying healthy. In between checkups, there are plenty of things you can do on your own. Experts have done a lot of research about which lifestyle changes and preventive measures are most likely to keep you healthy. Ask your health care provider for more information. WEIGHT AND DIET  Eat a healthy diet  Be sure to include plenty of vegetables, fruits, low-fat dairy products, and lean protein.  Do not eat a lot of foods high in solid fats, added sugars, or salt.  Get regular exercise. This is one of the most important things you can do for your health.  Most adults should exercise for at least 150 minutes each week. The exercise should increase your heart rate and make you sweat (moderate-intensity exercise).  Most adults should also do strengthening exercises at least twice a week. This is in addition to the moderate-intensity exercise.  Maintain a healthy weight  Body mass index (BMI) is a measurement that can be used to identify possible weight problems. It estimates body fat based on height and weight. Your health care provider can help determine your BMI and help you achieve or maintain a healthy weight.  For females 20 years of age and older:   A BMI below 18.5 is considered underweight.  A BMI of 18.5 to 24.9 is normal.  A BMI of 25 to 29.9 is considered overweight.  A BMI of 30 and above is considered obese.  Watch levels of cholesterol and blood lipids  You should start having your blood tested for lipids and cholesterol at 73 years of age, then have this test every 5 years.  You may need to have your cholesterol levels checked more often if:  Your lipid  or cholesterol levels are high.  You are older than 73 years of age.  You are at high risk for heart disease.  CANCER SCREENING   Lung Cancer  Lung cancer screening is recommended for adults 55-80 years old who are at high risk for lung cancer because of a history of smoking.  A yearly low-dose CT scan of the lungs is recommended for people who:  Currently smoke.  Have quit within the past 15 years.  Have at least a 30-pack-year history of smoking. A pack year is smoking an average of one pack of cigarettes a day for 1 year.  Yearly screening should continue until it has been 15 years since you quit.  Yearly screening should stop if you develop a health problem that would prevent you from having lung cancer treatment.  Breast Cancer  Practice breast self-awareness. This means understanding how your breasts normally appear and feel.  It also means doing regular breast self-exams. Let your health care provider know about any changes, no matter how small.  If you are in your 20s or 30s, you should have a clinical breast exam (CBE) by a health care provider every 1-3 years as part of a regular health exam.  If you are 40 or older, have a CBE every year. Also consider having a breast X-ray (mammogram) every year.  If you have a family history of breast cancer, talk to your health care provider about genetic screening.  If you   are at high risk for breast cancer, talk to your health care provider about having an MRI and a mammogram every year.  Breast cancer gene (BRCA) assessment is recommended for women who have family members with BRCA-related cancers. BRCA-related cancers include:  Breast.  Ovarian.  Tubal.  Peritoneal cancers.  Results of the assessment will determine the need for genetic counseling and BRCA1 and BRCA2 testing. Cervical Cancer Your health care provider may recommend that you be screened regularly for cancer of the pelvic organs (ovaries, uterus, and  vagina). This screening involves a pelvic examination, including checking for microscopic changes to the surface of your cervix (Pap test). You may be encouraged to have this screening done every 3 years, beginning at age 21.  For women ages 30-65, health care providers may recommend pelvic exams and Pap testing every 3 years, or they may recommend the Pap and pelvic exam, combined with testing for human papilloma virus (HPV), every 5 years. Some types of HPV increase your risk of cervical cancer. Testing for HPV may also be done on women of any age with unclear Pap test results.  Other health care providers may not recommend any screening for nonpregnant women who are considered low risk for pelvic cancer and who do not have symptoms. Ask your health care provider if a screening pelvic exam is right for you.  If you have had past treatment for cervical cancer or a condition that could lead to cancer, you need Pap tests and screening for cancer for at least 20 years after your treatment. If Pap tests have been discontinued, your risk factors (such as having a new sexual partner) need to be reassessed to determine if screening should resume. Some women have medical problems that increase the chance of getting cervical cancer. In these cases, your health care provider may recommend more frequent screening and Pap tests. Colorectal Cancer  This type of cancer can be detected and often prevented.  Routine colorectal cancer screening usually begins at 73 years of age and continues through 73 years of age.  Your health care provider may recommend screening at an earlier age if you have risk factors for colon cancer.  Your health care provider may also recommend using home test kits to check for hidden blood in the stool.  A small camera at the end of a tube can be used to examine your colon directly (sigmoidoscopy or colonoscopy). This is done to check for the earliest forms of colorectal  cancer.  Routine screening usually begins at age 50.  Direct examination of the colon should be repeated every 5-10 years through 73 years of age. However, you may need to be screened more often if early forms of precancerous polyps or small growths are found. Skin Cancer  Check your skin from head to toe regularly.  Tell your health care provider about any new moles or changes in moles, especially if there is a change in a mole's shape or color.  Also tell your health care provider if you have a mole that is larger than the size of a pencil eraser.  Always use sunscreen. Apply sunscreen liberally and repeatedly throughout the day.  Protect yourself by wearing long sleeves, pants, a wide-brimmed hat, and sunglasses whenever you are outside. HEART DISEASE, DIABETES, AND HIGH BLOOD PRESSURE   High blood pressure causes heart disease and increases the risk of stroke. High blood pressure is more likely to develop in:  People who have blood pressure in the high end   of the normal range (130-139/85-89 mm Hg).  People who are overweight or obese.  People who are African American.  If you are 38-23 years of age, have your blood pressure checked every 3-5 years. If you are 61 years of age or older, have your blood pressure checked every year. You should have your blood pressure measured twice--once when you are at a hospital or clinic, and once when you are not at a hospital or clinic. Record the average of the two measurements. To check your blood pressure when you are not at a hospital or clinic, you can use:  An automated blood pressure machine at a pharmacy.  A home blood pressure monitor.  If you are between 45 years and 39 years old, ask your health care provider if you should take aspirin to prevent strokes.  Have regular diabetes screenings. This involves taking a blood sample to check your fasting blood sugar level.  If you are at a normal weight and have a low risk for diabetes,  have this test once every three years after 73 years of age.  If you are overweight and have a high risk for diabetes, consider being tested at a younger age or more often. PREVENTING INFECTION  Hepatitis B  If you have a higher risk for hepatitis B, you should be screened for this virus. You are considered at high risk for hepatitis B if:  You were born in a country where hepatitis B is common. Ask your health care provider which countries are considered high risk.  Your parents were born in a high-risk country, and you have not been immunized against hepatitis B (hepatitis B vaccine).  You have HIV or AIDS.  You use needles to inject street drugs.  You live with someone who has hepatitis B.  You have had sex with someone who has hepatitis B.  You get hemodialysis treatment.  You take certain medicines for conditions, including cancer, organ transplantation, and autoimmune conditions. Hepatitis C  Blood testing is recommended for:  Everyone born from [birth year] through 1963.  Anyone with known risk factors for hepatitis C. Sexually transmitted infections (STIs)  You should be screened for sexually transmitted infections (STIs) including gonorrhea and chlamydia if:  You are sexually active and are younger than 73 years of age.  You are older than 73 years of age and your health care provider tells you that you are at risk for this type of infection.  Your sexual activity has changed since you were last screened and you are at an increased risk for chlamydia or gonorrhea. Ask your health care provider if you are at risk.  If you do not have HIV, but are at risk, it may be recommended that you take a prescription medicine daily to prevent HIV infection. This is called pre-exposure prophylaxis (PrEP). You are considered at risk if:  You are sexually active and do not regularly use condoms or know the HIV status of your partner(s).  You take drugs by injection.  You are sexually  active with a partner who has HIV. Talk with your health care provider about whether you are at high risk of being infected with HIV. If you choose to begin PrEP, you should first be tested for HIV. You should then be tested every 3 months for as long as you are taking PrEP.  PREGNANCY   If you are premenopausal and you may become pregnant, ask your health care provider about preconception counseling.  If you may  become pregnant, take 400 to 800 micrograms (mcg) of folic acid every day.  If you want to prevent pregnancy, talk to your health care provider about birth control (contraception). OSTEOPOROSIS AND MENOPAUSE   Osteoporosis is a disease in which the bones lose minerals and strength with aging. This can result in serious bone fractures. Your risk for osteoporosis can be identified using a bone density scan.  If you are 61 years of age or older, or if you are at risk for osteoporosis and fractures, ask your health care provider if you should be screened.  Ask your health care provider whether you should take a calcium or vitamin D supplement to lower your risk for osteoporosis.  Menopause may have certain physical symptoms and risks.  Hormone replacement therapy may reduce some of these symptoms and risks. Talk to your health care provider about whether hormone replacement therapy is right for you.  HOME CARE INSTRUCTIONS   Schedule regular health, dental, and eye exams.  Stay current with your immunizations.   Do not use any tobacco products including cigarettes, chewing tobacco, or electronic cigarettes.  If you are pregnant, do not drink alcohol.  If you are breastfeeding, limit how much and how often you drink alcohol.  Limit alcohol intake to no more than 1 drink per day for nonpregnant women. One drink equals 12 ounces of beer, 5 ounces of wine, or 1 ounces of hard liquor.  Do not use street drugs.  Do not share needles.  Ask your health care provider for help if  you need support or information about quitting drugs.  Tell your health care provider if you often feel depressed.  Tell your health care provider if you have ever been abused or do not feel safe at home.   This information is not intended to replace advice given to you by your health care provider. Make sure you discuss any questions you have with your health care provider.   Document Released: 04/26/2011 Document Revised: 11/01/2014 Document Reviewed: 09/12/2013 Elsevier Interactive Patient Education [employer].

## 2015-11-20 NOTE — Progress Notes (Signed)
Subjective:    Patient ID: Allison Parker, female    DOB: [birth year], 75 y.o.   MRN: [id number]  Pt presents to the office today for chronic follow up. Pt states she is  doing better today. States she is of still dealing with her son's unexpected death in 07/20/23.   Hypertension This is a chronic problem. The current episode started more than 1 year ago. The problem has been waxing and waning since onset. The problem is controlled. Associated symptoms include anxiety and peripheral edema ("at times and more in right leg"). Pertinent negatives include no headaches, palpitations or shortness of breath. Risk factors for coronary artery disease include dyslipidemia, obesity and sedentary lifestyle. Past treatments include calcium channel blockers. The current treatment provides moderate improvement. Hypertensive end-organ damage includes CVA and a thyroid problem. There is no history of kidney disease, CAD/MI or heart failure. There is no history of sleep apnea.  Anxiety Presents for follow-up visit. Onset was more than 5 years ago. The problem has been waxing and waning. Symptoms include excessive worry, insomnia, irritability and nervous/anxious behavior. Patient reports no depressed mood, muscle tension, palpitations or shortness of breath. Symptoms occur most days. The symptoms are aggravated by family issues (Son died in 07/20/2015). The quality of sleep is fair.   Her past medical history is significant for anxiety/panic attacks, CAD and depression. Past treatments include SSRIs and benzodiazephines.  Hyperlipidemia This is a chronic problem. The current episode started more than 1 year ago. The problem is controlled. Recent lipid tests were reviewed and are normal. Exacerbating diseases include hypothyroidism. She has no history of diabetes. Pertinent negatives include no leg pain, myalgias or shortness of breath. Current antihyperlipidemic treatment includes statins. The current treatment  provides moderate improvement of lipids. Risk factors for coronary artery disease include dyslipidemia, hypertension, obesity, post-menopausal, a sedentary lifestyle and stress.  Thyroid Problem Presents for follow-up visit. Symptoms include anxiety and dry skin. Patient reports no depressed mood, diarrhea, hair loss, hoarse voice, palpitations or visual change. The symptoms have been stable. Past treatments include levothyroxine. The treatment provided significant relief. Her past medical history is significant for hyperlipidemia. There is no history of diabetes or heart failure.  Insomnia Primary symptoms: fragmented sleep, sleep disturbance, frequent awakening.  The current episode started more than one year. The onset quality is gradual. The problem occurs nightly. The problem has been waxing and waning since onset. The symptoms are aggravated by family issues and anxiety. The symptoms are relieved by darkened room, quiet environment and relaxation. Past treatments include meditation.  [facility] PT was taking Vesicare 5 mg, but states it was making her mouth so dry that she was having to drink more water. This caused her to use the restroom the same without the medication. . Pt states the Myrbetriq worked better for her and did not cause the side effects.     Review of Systems  Constitutional: Positive for irritability.  HENT: Negative.  Negative for hoarse voice.   Eyes: Negative.   Respiratory: Negative.  Negative for shortness of breath.   Cardiovascular: Negative.  Negative for palpitations.  Gastrointestinal: Negative.  Negative for diarrhea.  Endocrine: Negative.   Genitourinary: Negative.   Musculoskeletal: Negative.  Negative for myalgias.  Neurological: Negative.  Negative for headaches.  Hematological: Negative.   Psychiatric/Behavioral: Positive for sleep disturbance. The patient is nervous/anxious and has insomnia.   All other systems reviewed and are negative.      Objective:  Physical Exam  Constitutional: She is oriented to person, place, and time. She appears well-developed and well-nourished. No distress.  HENT:  Head: Normocephalic and atraumatic.  Right Ear: External ear normal.  Left Ear: External ear normal.  Nose: Nose normal.  Mouth/Throat: Oropharynx is clear and moist.  Eyes: Pupils are equal, round, and reactive to light.  Neck: Normal range of motion. Neck supple. No thyromegaly present.  Cardiovascular: Normal rate, regular rhythm, normal heart sounds and intact distal pulses.   No murmur heard. Pulmonary/Chest: Effort normal and breath sounds normal. No respiratory distress. She has no wheezes.  Abdominal: Soft. Bowel sounds are normal. She exhibits no distension. There is no tenderness.  Musculoskeletal: Normal range of motion. She exhibits edema (trace amt in left leg and foot). She exhibits no tenderness.  Neurological: She is alert and oriented to person, place, and time. She has normal reflexes. No cranial nerve deficit.  Skin: Skin is warm and dry.  Psychiatric: She has a normal mood and affect. Her behavior is normal. Judgment and thought content normal.  Vitals reviewed.     BP 131/81 mmHg  Pulse 75  Temp(Src) 98.4 F (36.9 C) (Oral)  Ht _0  (1.651 m)  Wt 190 lb 3.2 oz (86.274 kg)  BMI 31.65 kg/m2     Assessment & Plan:  1. Essential hypertension, benign - CMP14+EGFR - amLODipine (NORVASC) 5 MG tablet; TAKE 1 TABLET (5 MG TOTAL) BY MOUTH DAILY.  Dispense: 90 tablet; Refill: 3  2. Cerebral artery occlusion with cerebral infarction (HCC) - CMP14+EGFR  3. Hypothyroidism, unspecified hypothyroidism type - CMP14+EGFR - Thyroid Panel With TSH  4. Vitamin D deficiency - CMP14+EGFR - VITAMIN D 25 Hydroxy (Vit-D Deficiency, Fractures)  5. Overactive bladder -PT started on Myrbetriq 25 mg today- Because of adverse reaction from Vesicare - CMP14+EGFR -- mirabegron ER (MYRBETRIQ) 25 MG TB24 tablet; Take 1 tablet (25 mg  total) by mouth daily.  Dispense: 90 tablet; Refill: 1  6. Insomnia - CMP14+EGFR - ALPRAZolam (XANAX) 0.25 MG tablet; Take 1 tablet (0.25 mg total) by mouth at bedtime as needed for anxiety.  Dispense: 30 tablet; Refill: 3  7. Hyperlipidemia - CMP14+EGFR - Lipid panel - atorvastatin (LIPITOR) 20 MG tablet; Take 1 tablet (20 mg total) by mouth daily.  Dispense: 90 tablet; Refill: 4  8. GAD (generalized anxiety disorder) - CMP14+EGFR - ALPRAZolam (XANAX) 0.25 MG tablet; Take 1 tablet (0.25 mg total) by mouth at bedtime as needed for anxiety.  Dispense: 30 tablet; Refill: 3 - citalopram (CELEXA) 20 MG tablet; Take 1 tablet (20 mg total) by mouth daily.  Dispense: 90 tablet; Refill: 3  9. Depression - CMP14+EGFR - citalopram (CELEXA) 20 MG tablet; Take 1 tablet (20 mg total) by mouth daily.  Dispense: 90 tablet; Refill: 3   Continue all meds Labs pending Health Maintenance reviewed Diet and exercise encouraged RTO 3 months  Evelina Dun, FNP

## 2015-11-20 NOTE — Addendum Note (Signed)
Addended by: Pollyann Kennedy F on: 11/20/2015 12:32 PM   Modules accepted: Miquel Dunn

## 2015-11-21 LAB — LIPID PANEL
CHOLESTEROL TOTAL: 155 mg/dL (ref 100–199)
Chol/HDL Ratio: 1.9 ratio units (ref 0.0–4.4)
HDL: 83 mg/dL (ref >39)
LDL CALC: 50 mg/dL (ref 0–99)
Triglycerides: 108 mg/dL (ref 0–149)
VLDL Cholesterol Cal: 22 mg/dL (ref 5–40)

## 2015-11-21 LAB — THYROID PANEL WITH TSH
FREE THYROXINE INDEX: 3.2 (ref 1.2–4.9)
T3 UPTAKE RATIO: 31 % (ref 24–39)
T4 TOTAL: 10.2 ug/dL (ref 4.5–12.0)
TSH: 1.34 u[IU]/mL (ref 0.450–4.500)

## 2015-11-21 LAB — CMP14+EGFR
A/G RATIO: 2 (ref 1.1–2.5)
ALK PHOS: 78 IU/L (ref 39–117)
ALT: 12 IU/L (ref 0–32)
AST: 19 IU/L (ref 0–40)
Albumin: 4.3 g/dL (ref 3.5–4.8)
BUN / CREAT RATIO: 28 — AB (ref 11–26)
BUN: 21 mg/dL (ref 8–27)
Bilirubin Total: 0.3 mg/dL (ref 0.0–1.2)
CALCIUM: 10.1 mg/dL (ref 8.7–10.3)
CHLORIDE: 98 mmol/L (ref 96–106)
CO2: 28 mmol/L (ref 18–29)
Creatinine, Ser: 0.75 mg/dL (ref 0.57–1.00)
GFR calc Af Amer: 92 mL/min/{1.73_m2} (ref >59)
GFR calc non Af Amer: 80 mL/min/{1.73_m2} (ref >59)
GLUCOSE: 87 mg/dL (ref 65–99)
Globulin, Total: 2.2 g/dL (ref 1.5–4.5)
POTASSIUM: 4.6 mmol/L (ref 3.5–5.2)
SODIUM: 138 mmol/L (ref 134–144)
Total Protein: 6.5 g/dL (ref 6.0–8.5)

## 2015-11-21 LAB — VITAMIN D 25 HYDROXY (VIT D DEFICIENCY, FRACTURES): VIT D 25 HYDROXY: 40.6 ng/mL (ref 30.0–100.0)

## 2015-12-12 ENCOUNTER — Telehealth: Payer: Self-pay | Admitting: Family

## 2015-12-12 DIAGNOSIS — M4802 Spinal stenosis, cervical region: Secondary | ICD-10-CM

## 2015-12-12 DIAGNOSIS — G890 Central pain syndrome: Secondary | ICD-10-CM

## 2015-12-12 MED ORDER — HYDROCODONE-ACETAMINOPHEN 10-325 MG PO TABS: 1.0000 | ORAL_TABLET | Freq: Three times a day (TID) | ORAL | Status: DC | PRN

## 2015-12-12 NOTE — Telephone Encounter (Signed)
Patient aware that rx is ready to be picked up.  

## 2015-12-12 NOTE — Telephone Encounter (Signed)
Rx ready for pick up. Pt needs appt for pain contract

## 2015-12-22 ENCOUNTER — Encounter: Payer: Self-pay | Admitting: Family

## 2016-01-05 ENCOUNTER — Ambulatory Visit (INDEPENDENT_AMBULATORY_CARE_PROVIDER_SITE_OTHER): Payer: Medicare HMO | Admitting: Family

## 2016-01-05 ENCOUNTER — Encounter: Payer: Self-pay | Admitting: Family

## 2016-01-05 VITALS — BP 119/71 | HR 74 | Temp 98.0°F | Ht 65.0 in | Wt 187.6 lb

## 2016-01-05 DIAGNOSIS — G8929 Other chronic pain: Secondary | ICD-10-CM

## 2016-01-05 DIAGNOSIS — F1120 Opioid dependence, uncomplicated: Secondary | ICD-10-CM | POA: Diagnosis not present

## 2016-01-05 DIAGNOSIS — G890 Central pain syndrome: Secondary | ICD-10-CM | POA: Diagnosis not present

## 2016-01-05 DIAGNOSIS — Z79899 Other long term (current) drug therapy: Secondary | ICD-10-CM | POA: Diagnosis not present

## 2016-01-05 DIAGNOSIS — M549 Dorsalgia, unspecified: Secondary | ICD-10-CM

## 2016-01-05 DIAGNOSIS — Z5189 Encounter for other specified aftercare: Secondary | ICD-10-CM

## 2016-01-05 DIAGNOSIS — F411 Generalized anxiety disorder: Secondary | ICD-10-CM

## 2016-01-05 DIAGNOSIS — Z0289 Encounter for other administrative examinations: Secondary | ICD-10-CM | POA: Insufficient documentation

## 2016-01-05 DIAGNOSIS — M4802 Spinal stenosis, cervical region: Secondary | ICD-10-CM

## 2016-01-05 DIAGNOSIS — R52 Pain, unspecified: Secondary | ICD-10-CM

## 2016-01-05 DIAGNOSIS — R69 Illness, unspecified: Secondary | ICD-10-CM | POA: Diagnosis not present

## 2016-01-05 DIAGNOSIS — G4700 Insomnia, unspecified: Secondary | ICD-10-CM | POA: Diagnosis not present

## 2016-01-05 MED ORDER — HYDROCODONE-ACETAMINOPHEN 10-325 MG PO TABS: 1.0000 | ORAL_TABLET | Freq: Four times a day (QID) | ORAL | Status: DC | PRN

## 2016-01-05 MED ORDER — ALPRAZOLAM 0.25 MG PO TABS: 0.2500 mg | ORAL_TABLET | Freq: Every evening | ORAL | Status: DC | PRN

## 2016-01-05 MED ORDER — HYDROCODONE-ACETAMINOPHEN 10-325 MG PO TABS: 1.0000 | ORAL_TABLET | Freq: Three times a day (TID) | ORAL | Status: DC | PRN

## 2016-01-05 NOTE — Patient Instructions (Signed)

## 2016-01-05 NOTE — Progress Notes (Signed)
Subjective:    Patient ID: Allison Parker, female    DOB: [birth year], 75 y.o.   MRN: [id number]  HPI [facility] Controlled Substance Abuse database reviewed- Yes  Depression screen [facility] 2/9 01/05/2016 11/20/2015 08/08/2015 02/11/2015 11/11/2014  Decreased Interest 1 0 3 0 0  Down, Depressed, Hopeless 0 0 3 0 0  PHQ - 2 Score 1 0 6 0 0    GAD 7 : Generalized Anxiety Score 01/05/2016  Nervous, Anxious, on Edge 2  Control/stop worrying 2  Worry too much - different things 2  Trouble relaxing 3  Restless 2  Easily annoyed or irritable 3  Afraid - awful might happen 2  Total GAD 7 Score 16  Anxiety Difficulty Somewhat difficult       [facility] drug screen performed- Yes  SOAPP  0= never  1= seldom  2=sometimes  3= often  4= very often  How often do you have mood swings? 0 How often do you smoke a cigarette within an hour after waling up? 0 How often have you taken medication other than the way that it was prescribed?0 How often have you used illegal drugs in the past 5 years? 0 How often, in your lifetime, have you had legal problems or been arrested? 0  Score 0  Alcohol Audit - How often during the last year have found that you: 0-Never   1- Less than monthly   2- Monthly     3-Weekly     4-daily or almost daily  - found that you were not able to stop drinking once you started- 0 -failed to do what was normally expected of you because of drinking- 0 -needed a first drink in the morning- 0 -had a feeling of guilt or remorse after drinking- 0 -are/were unable to remember what happened the night before because of your drinking- 0  0- NO   2- yes but not in last year  4- yes during last year -Have you or someone else been injured because of your drinking- 0 - Has anyone been concerned about your drinking or suggested you cut down- 0        TOTAL- 0   ( 0-7- alcohol education, 8-15- simple advice, 16-19 simple advice plus counseling, 20-40 referral for evaluation and  treatment 0   [place]  Pain assessment: Cause of pain- thalamic pain, chronic back pain Pain location- back and right leg  Pain on scale of 1-10- 5-6  Frequency- constant  What increases pain-movement, walking  What makes pain Better-Rest and pain medication  Pain management agreement reviewed and signed- Yes     Review of Systems  Constitutional: Negative.   HENT: Negative.   Eyes: Negative.   Respiratory: Negative.  Negative for shortness of breath.   Cardiovascular: Negative.  Negative for palpitations.  Gastrointestinal: Negative.   Endocrine: Negative.   Genitourinary: Negative.   Musculoskeletal: Negative.   Neurological: Negative.  Negative for headaches.  Hematological: Negative.   Psychiatric/Behavioral: Negative.   All other systems reviewed and are negative.      Objective:   Physical Exam  Constitutional: She is oriented to person, place, and time. She appears well-developed and well-nourished. No distress.  HENT:  Head: Normocephalic and atraumatic.  Eyes: Pupils are equal, round, and reactive to light.  Neck: Normal range of motion. Neck supple. No thyromegaly present.  Cardiovascular: Normal rate, regular rhythm, normal heart sounds and intact distal pulses.   No murmur heard. Pulmonary/Chest:  Effort normal and breath sounds normal. No respiratory distress. She has no wheezes.  Abdominal: Soft. Bowel sounds are normal. She exhibits no distension. There is no tenderness.  Musculoskeletal: She exhibits no edema or tenderness.  Right side weakness presents,  tremor of right hand  Neurological: She is alert and oriented to person, place, and time.  Skin: Skin is warm and dry.  Psychiatric: She has a normal mood and affect. Her behavior is normal. Judgment and thought content normal.  Vitals reviewed.   BP 119/71 mmHg  Pulse 74  Temp(Src) 98 F (36.7 C) (Oral)  Ht 5\' 5"  (1.651 m)  Wt 187 lb 9.6 oz (85.095 kg)   BMI 31.22 kg/m2       Assessment & Plan:  1. Pain management - ToxASSURE Select 13 (MW), Urine - HYDROcodone-acetaminophen (NORCO) 10-325 MG tablet; Take 1 tablet by mouth every 8 (eight) hours as needed.  Dispense: 90 tablet; Refill: 0 - HYDROcodone-acetaminophen (NORCO) 10-325 MG tablet; Take 1 tablet by mouth every 6 (six) hours as needed.  Dispense: 90 tablet; Refill: 0 - HYDROcodone-acetaminophen (NORCO) 10-325 MG tablet; Take 1 tablet by mouth every 6 (six) hours as needed.  Dispense: 90 tablet; Refill: 0  2. Chronic back pain - HYDROcodone-acetaminophen (NORCO) 10-325 MG tablet; Take 1 tablet by mouth every 8 (eight) hours as needed.  Dispense: 90 tablet; Refill: 0 - HYDROcodone-acetaminophen (NORCO) 10-325 MG tablet; Take 1 tablet by mouth every 6 (six) hours as needed.  Dispense: 90 tablet; Refill: 0 - HYDROcodone-acetaminophen (NORCO) 10-325 MG tablet; Take 1 tablet by mouth every 6 (six) hours as needed.  Dispense: 90 tablet; Refill: 0  3. Thalamic pain syndrome - HYDROcodone-acetaminophen (NORCO) 10-325 MG tablet; Take 1 tablet by mouth every 8 (eight) hours as needed.  Dispense: 90 tablet; Refill: 0 - HYDROcodone-acetaminophen (NORCO) 10-325 MG tablet; Take 1 tablet by mouth every 6 (six) hours as needed.  Dispense: 90 tablet; Refill: 0 - HYDROcodone-acetaminophen (NORCO) 10-325 MG tablet; Take 1 tablet by mouth every 6 (six) hours as needed.  Dispense: 90 tablet; Refill: 0  4. Pain medication agreement signed - HYDROcodone-acetaminophen (NORCO) 10-325 MG tablet; Take 1 tablet by mouth every 8 (eight) hours as needed.  Dispense: 90 tablet; Refill: 0 - HYDROcodone-acetaminophen (NORCO) 10-325 MG tablet; Take 1 tablet by mouth every 6 (six) hours as needed.  Dispense: 90 tablet; Refill: 0 - HYDROcodone-acetaminophen (NORCO) 10-325 MG tablet; Take 1 tablet by mouth every 6 (six) hours as needed.  Dispense: 90 tablet; Refill: 0  5. Uncomplicated opioid dependence (HCC) -  HYDROcodone-acetaminophen (NORCO) 10-325 MG tablet; Take 1 tablet by mouth every 8 (eight) hours as needed.  Dispense: 90 tablet; Refill: 0 - HYDROcodone-acetaminophen (NORCO) 10-325 MG tablet; Take 1 tablet by mouth every 6 (six) hours as needed.  Dispense: 90 tablet; Refill: 0 - HYDROcodone-acetaminophen (NORCO) 10-325 MG tablet; Take 1 tablet by mouth every 6 (six) hours as needed.  Dispense: 90 tablet; Refill: 0  6. Spinal stenosis of cervical region - HYDROcodone-acetaminophen (NORCO) 10-325 MG tablet; Take 1 tablet by mouth every 8 (eight) hours as needed.  Dispense: 90 tablet; Refill: 0  7. Insomnia - ALPRAZolam (XANAX) 0.25 MG tablet; Take 1 tablet (0.25 mg total) by mouth at bedtime as needed for anxiety.  Dispense: 30 tablet; Refill: 3  8. GAD (generalized anxiety disorder) - ALPRAZolam (XANAX) 0.25 MG tablet; Take 1 tablet (0.25 mg total) by mouth at bedtime as needed for anxiety.  Dispense: 30 tablet; Refill: 3  Continue all meds Labs pending Health Maintenance reviewed Diet and exercise encouraged RTO 3 months  Evelina Dun, FNP

## 2016-01-09 LAB — TOXASSURE SELECT 13 (MW), URINE: PDF: 0

## 2016-01-27 DIAGNOSIS — H353122 Nonexudative age-related macular degeneration, left eye, intermediate dry stage: Secondary | ICD-10-CM | POA: Diagnosis not present

## 2016-01-27 DIAGNOSIS — H353112 Nonexudative age-related macular degeneration, right eye, intermediate dry stage: Secondary | ICD-10-CM | POA: Diagnosis not present

## 2016-01-27 DIAGNOSIS — H35372 Puckering of macula, left eye: Secondary | ICD-10-CM | POA: Diagnosis not present

## 2016-02-02 DIAGNOSIS — L820 Inflamed seborrheic keratosis: Secondary | ICD-10-CM | POA: Diagnosis not present

## 2016-02-02 DIAGNOSIS — L570 Actinic keratosis: Secondary | ICD-10-CM | POA: Diagnosis not present

## 2016-02-03 ENCOUNTER — Other Ambulatory Visit: Payer: Self-pay | Admitting: Family

## 2016-02-20 ENCOUNTER — Ambulatory Visit: Payer: Medicare HMO | Admitting: Family

## 2016-02-20 DIAGNOSIS — H26492 Other secondary cataract, left eye: Secondary | ICD-10-CM | POA: Diagnosis not present

## 2016-02-20 DIAGNOSIS — Z0100 Encounter for examination of eyes and vision without abnormal findings: Secondary | ICD-10-CM | POA: Diagnosis not present

## 2016-02-20 DIAGNOSIS — H353112 Nonexudative age-related macular degeneration, right eye, intermediate dry stage: Secondary | ICD-10-CM | POA: Diagnosis not present

## 2016-02-20 DIAGNOSIS — H35372 Puckering of macula, left eye: Secondary | ICD-10-CM | POA: Diagnosis not present

## 2016-02-25 ENCOUNTER — Other Ambulatory Visit: Payer: Self-pay | Admitting: Family

## 2016-03-04 DIAGNOSIS — H26492 Other secondary cataract, left eye: Secondary | ICD-10-CM | POA: Diagnosis not present

## 2016-04-08 ENCOUNTER — Encounter: Payer: Self-pay | Admitting: Family

## 2016-04-08 ENCOUNTER — Ambulatory Visit (INDEPENDENT_AMBULATORY_CARE_PROVIDER_SITE_OTHER): Payer: Medicare HMO | Admitting: Family

## 2016-04-08 ENCOUNTER — Other Ambulatory Visit: Payer: Self-pay | Admitting: Family

## 2016-04-08 VITALS — BP 139/71 | HR 77 | Temp 97.0°F | Ht 65.0 in | Wt 187.8 lb

## 2016-04-08 DIAGNOSIS — E785 Hyperlipidemia, unspecified: Secondary | ICD-10-CM

## 2016-04-08 DIAGNOSIS — Z79899 Other long term (current) drug therapy: Secondary | ICD-10-CM

## 2016-04-08 DIAGNOSIS — I10 Essential (primary) hypertension: Secondary | ICD-10-CM | POA: Diagnosis not present

## 2016-04-08 DIAGNOSIS — G4700 Insomnia, unspecified: Secondary | ICD-10-CM

## 2016-04-08 DIAGNOSIS — G8929 Other chronic pain: Secondary | ICD-10-CM

## 2016-04-08 DIAGNOSIS — F32A Depression, unspecified: Secondary | ICD-10-CM

## 2016-04-08 DIAGNOSIS — F411 Generalized anxiety disorder: Secondary | ICD-10-CM

## 2016-04-08 DIAGNOSIS — E559 Vitamin D deficiency, unspecified: Secondary | ICD-10-CM

## 2016-04-08 DIAGNOSIS — F329 Major depressive disorder, single episode, unspecified: Secondary | ICD-10-CM

## 2016-04-08 DIAGNOSIS — E039 Hypothyroidism, unspecified: Secondary | ICD-10-CM

## 2016-04-08 DIAGNOSIS — N3281 Overactive bladder: Secondary | ICD-10-CM | POA: Diagnosis not present

## 2016-04-08 DIAGNOSIS — G890 Central pain syndrome: Secondary | ICD-10-CM

## 2016-04-08 DIAGNOSIS — E669 Obesity, unspecified: Secondary | ICD-10-CM

## 2016-04-08 DIAGNOSIS — M549 Dorsalgia, unspecified: Secondary | ICD-10-CM | POA: Diagnosis not present

## 2016-04-08 DIAGNOSIS — R69 Illness, unspecified: Secondary | ICD-10-CM | POA: Diagnosis not present

## 2016-04-08 DIAGNOSIS — Z5189 Encounter for other specified aftercare: Secondary | ICD-10-CM

## 2016-04-08 DIAGNOSIS — M4802 Spinal stenosis, cervical region: Secondary | ICD-10-CM

## 2016-04-08 DIAGNOSIS — R52 Pain, unspecified: Secondary | ICD-10-CM

## 2016-04-08 DIAGNOSIS — F1120 Opioid dependence, uncomplicated: Secondary | ICD-10-CM

## 2016-04-08 DIAGNOSIS — Z0289 Encounter for other administrative examinations: Secondary | ICD-10-CM

## 2016-04-08 MED ORDER — ASCRIPTIN 325 MG PO TABS: 325.0000 mg | ORAL_TABLET | Freq: Every day | ORAL | Status: DC

## 2016-04-08 MED ORDER — HYDROCODONE-ACETAMINOPHEN 10-325 MG PO TABS: 1.0000 | ORAL_TABLET | Freq: Three times a day (TID) | ORAL | Status: DC | PRN

## 2016-04-08 MED ORDER — LEVOTHYROXINE SODIUM 25 MCG PO TABS: ORAL_TABLET | ORAL | Status: DC

## 2016-04-08 MED ORDER — HYDROCODONE-ACETAMINOPHEN 10-325 MG PO TABS: 1.0000 | ORAL_TABLET | Freq: Four times a day (QID) | ORAL | Status: DC | PRN

## 2016-04-08 MED ORDER — ALPRAZOLAM 0.25 MG PO TABS: 0.2500 mg | ORAL_TABLET | Freq: Every evening | ORAL | Status: DC | PRN

## 2016-04-08 MED ORDER — BACLOFEN 20 MG PO TABS: 20.0000 mg | ORAL_TABLET | Freq: Three times a day (TID) | ORAL | Status: DC

## 2016-04-08 MED ORDER — MIRABEGRON ER 25 MG PO TB24: 25.0000 mg | ORAL_TABLET | Freq: Every day | ORAL | Status: DC

## 2016-04-08 NOTE — Patient Instructions (Signed)
Pneumococcal Conjugate Vaccine (PCV13)   1. Why get vaccinated?  Vaccination can protect both children and adults from pneumococcal disease.  Pneumococcal disease is caused by bacteria that can spread from person to person through close contact. It can cause ear infections, and it can also lead to more serious infections of the:  · Lungs (pneumonia),  · Blood (bacteremia), and  · Covering of the brain and spinal cord (meningitis).  Pneumococcal pneumonia is most common among adults. Pneumococcal meningitis can cause deafness and brain damage, and it kills about 1 child in 10 who get it.  Anyone can get pneumococcal disease, but children under 2 years of age and adults 65 years and older, people with certain medical conditions, and cigarette smokers are at the highest risk.  Before there was a vaccine, the [place] saw:  · more than 700 cases of meningitis,  · about 13,000 blood infections,  · about 5 million ear infections, and  · about 200 deaths  in children under 5 each year from pneumococcal disease. Since vaccine became available, severe pneumococcal disease in these children has fallen by 88%.  About 18,000 older adults die of pneumococcal disease each year in the [place].  Treatment of pneumococcal infections with penicillin and other drugs is not as effective as it used to be, because some strains of the disease have become resistant to these drugs. This makes prevention of the disease, through vaccination, even more important.  2. PCV13 vaccine  Pneumococcal conjugate vaccine (called PCV13) protects against 13 types of pneumococcal bacteria.  PCV13 is routinely given to children at 2, 4, 6, and 12-15 months of age. It is also recommended for children and adults 2 to 64 years of age with certain health conditions, and for all adults 65 years of age and older. Your doctor can give you details.  3. Some people should not get this vaccine  Anyone who has ever had a life-threatening allergic reaction  to a dose of this vaccine, to an earlier pneumococcal vaccine called PCV7, or to any vaccine containing diphtheria toxoid (for example, DTaP), should not get PCV13.  Anyone with a severe allergy to any component of PCV13 should not get the vaccine. Tell your doctor if the person being vaccinated has any severe allergies.  If the person scheduled for vaccination is not feeling well, your healthcare provider might decide to reschedule the shot on another day.  4. Risks of a vaccine reaction  With any medicine, including vaccines, there is a chance of reactions. These are usually mild and go away on their own, but serious reactions are also possible.  Problems reported following PCV13 varied by age and dose in the series. The most common problems reported among children were:  · About half became drowsy after the shot, had a temporary loss of appetite, or had redness or tenderness where the shot was given.  · About 1 out of 3 had swelling where the shot was given.  · About 1 out of 3 had a mild fever, and about 1 in 20 had a fever over 102.2°F.  · Up to about 8 out of 10 became fussy or irritable.  Adults have reported pain, redness, and swelling where the shot was given; also mild fever, fatigue, headache, chills, or muscle pain.  Young children who get PCV13 along with inactivated flu vaccine at the same time may be at increased risk for seizures caused by fever. Ask your doctor for more information.  Problems that   could happen after any vaccine:  · People sometimes faint after a medical procedure, including vaccination. Sitting or lying down for about 15 minutes can help prevent fainting, and injuries caused by a fall. Tell your doctor if you feel dizzy, or have vision changes or ringing in the ears.  · Some older children and adults get severe pain in the shoulder and have difficulty moving the arm where a shot was given. This happens very rarely.  · Any medication can cause a severe allergic reaction. Such  reactions from a vaccine are very rare, estimated at about 1 in a million doses, and would happen within a few minutes to a few hours after the vaccination.  As with any medicine, there is a very small chance of a vaccine causing a serious injury or death.  The safety of vaccines is always being monitored. For more information, visit: www.cdc.gov/vaccinesafety/  5. What if there is a serious reaction?  What should I look for?  · Look for anything that concerns you, such as signs of a severe allergic reaction, very high fever, or unusual behavior.  Signs of a severe allergic reaction can include hives, swelling of the face and throat, difficulty breathing, a fast heartbeat, dizziness, and weakness-usually within a few minutes to a few hours after the vaccination.  What should I do?  · If you think it is a severe allergic reaction or other emergency that can't wait, call 9-1-1 or get the person to the nearest hospital. Otherwise, call your doctor.  Reactions should be reported to the Vaccine Adverse Event Reporting System (VAERS). Your doctor should file this report, or you can do it yourself through the VAERS web site at www.vaers.hhs.gov, or by calling 1-800-822-7967.  VAERS does not give medical advice.  6. The National Vaccine Injury Compensation Program  The National Vaccine Injury Compensation Program (VICP) is a federal program that was created to compensate people who may have been injured by certain vaccines.  Persons who believe they may have been injured by a vaccine can learn about the program and about filing a claim by calling 1-800-338-2382 or visiting the VICP website at www.hrsa.gov/vaccinecompensation. There is a time limit to file a claim for compensation.  7. How can I learn more?  · Ask your healthcare provider. He or she can give you the vaccine package insert or suggest other sources of information.  · Call your local or state health department.  · Contact the Centers for Disease Control and  Prevention (CDC):    Call 1-800-232-4636 (1-800-CDC-INFO) or    Visit CDC's website at www.cdc.gov/vaccines  Vaccine Information Statement  PCV13 Vaccine (08/29/2014)     This information is not intended to replace advice given to you by your health care provider. Make sure you discuss any questions you have with your health care provider.     Document Released: 08/08/2006 Document Revised: 11/01/2014 Document Reviewed: 09/05/2014  Elsevier Interactive Patient Education ©2016 [employer]

## 2016-04-08 NOTE — Progress Notes (Signed)
Subjective:    Patient ID: Allison Parker, female    DOB: 1940/11/22, 75 y.o.   MRN: [id number]  Pt presents to the office today for chronic follow up. Pt states she is  doing better today. States she is of still dealing with her son's unexpected death in Jul 20, 2023.   Hypertension This is a chronic problem. The current episode started more than 1 year ago. The problem has been resolved since onset. The problem is controlled. Associated symptoms include anxiety and peripheral edema ("at times and more in right leg"). Pertinent negatives include no blurred vision, headaches or palpitations. Risk factors for coronary artery disease include dyslipidemia, obesity and sedentary lifestyle. Past treatments include calcium channel blockers. The current treatment provides moderate improvement. Hypertensive end-organ damage includes CVA and a thyroid problem. There is no history of kidney disease, CAD/MI or heart failure. There is no history of sleep apnea.  Anxiety Presents for follow-up visit. Onset was more than 5 years ago. The problem has been waxing and waning. Symptoms include excessive worry, insomnia, irritability and nervous/anxious behavior. Patient reports no depressed mood, muscle tension or palpitations. Symptoms occur most days. The symptoms are aggravated by family issues (Son died in 07-20-15). The quality of sleep is fair.   Her past medical history is significant for anxiety/panic attacks, CAD and depression. Past treatments include SSRIs and benzodiazephines.  Hyperlipidemia This is a chronic problem. The current episode started more than 1 year ago. The problem is controlled. Recent lipid tests were reviewed and are normal. Exacerbating diseases include hypothyroidism. She has no history of diabetes. Pertinent negatives include no leg pain or myalgias. Current antihyperlipidemic treatment includes statins. The current treatment provides moderate improvement of lipids. Risk factors for  coronary artery disease include dyslipidemia, hypertension, obesity, post-menopausal, a sedentary lifestyle and stress.  Thyroid Problem Presents for follow-up visit. Symptoms include anxiety and dry skin. Patient reports no depressed mood, diarrhea, hair loss, hoarse voice, palpitations or visual change. The symptoms have been stable. Past treatments include levothyroxine. The treatment provided significant relief. Her past medical history is significant for hyperlipidemia. There is no history of diabetes or heart failure.  Insomnia Primary symptoms: fragmented sleep, sleep disturbance, frequent awakening.  The current episode started more than one year. The onset quality is gradual. The problem occurs nightly. The problem has been waxing and waning since onset. The symptoms are aggravated by family issues and anxiety. The symptoms are relieved by darkened room, quiet environment and relaxation. Past treatments include meditation.  [facility] PT states this is doing better now that she is taking  the Myrbetriq daily.    Review of Systems  Constitutional: Positive for irritability.  HENT: Negative.  Negative for hoarse voice.   Eyes: Negative.  Negative for blurred vision.  Respiratory: Negative.   Cardiovascular: Negative.  Negative for palpitations.  Gastrointestinal: Negative.  Negative for diarrhea.  Endocrine: Negative.   Genitourinary: Negative.   Musculoskeletal: Negative.  Negative for myalgias.  Neurological: Negative.  Negative for headaches.  Hematological: Negative.   Psychiatric/Behavioral: Positive for sleep disturbance. The patient is nervous/anxious and has insomnia.   All other systems reviewed and are negative.      Objective:   Physical Exam  Constitutional: She is oriented to person, place, and time. She appears well-developed and well-nourished. No distress.  HENT:  Head: Normocephalic and atraumatic.  Right Ear: External ear normal.  Left Ear: External ear normal.    Nose: Nose normal.  Mouth/Throat: Oropharynx is clear and  moist.  Eyes: Pupils are equal, round, and reactive to light.  Neck: Normal range of motion. Neck supple. No thyromegaly present.  Cardiovascular: Normal rate, regular rhythm, normal heart sounds and intact distal pulses.   No murmur heard. Pulmonary/Chest: Effort normal and breath sounds normal. No respiratory distress. She has no wheezes.  Abdominal: Soft. Bowel sounds are normal. She exhibits no distension. There is no tenderness.  Musculoskeletal: Normal range of motion. She exhibits edema (trace amt in right leg and foot). She exhibits no tenderness.  Neurological: She is alert and oriented to person, place, and time. She has normal reflexes. No cranial nerve deficit.  Skin: Skin is warm and dry.  Psychiatric: She has a normal mood and affect. Her behavior is normal. Judgment and thought content normal.  Vitals reviewed.     BP 139/71 mmHg  Pulse 77  Temp(Src) 97 F (36.1 C) (Oral)  Ht 5' 5"  (1.651 m)  Wt 187 lb 12.8 oz (85.186 kg)  BMI 31.25 kg/m2     Assessment & Plan:  1. Essential hypertension, benign - CMP14+EGFR  2. Hypothyroidism, unspecified hypothyroidism type - levothyroxine (SYNTHROID, LEVOTHROID) 25 MCG tablet; TAKE 1 TABLET BY MOUTH ONE TIME A DAY  Dispense: 90 tablet; Refill: 3 - CMP14+EGFR - Thyroid Panel With TSH  3. Thalamic pain syndrome - HYDROcodone-acetaminophen (NORCO) 10-325 MG tablet; Take 1 tablet by mouth every 6 (six) hours as needed.  Dispense: 90 tablet; Refill: 0 - HYDROcodone-acetaminophen (NORCO) 10-325 MG tablet; Take 1 tablet by mouth every 8 (eight) hours as needed.  Dispense: 90 tablet; Refill: 0 - HYDROcodone-acetaminophen (NORCO) 10-325 MG tablet; Take 1 tablet by mouth every 6 (six) hours as needed.  Dispense: 90 tablet; Refill: 0 - CMP14+EGFR  4. Overactive bladder - mirabegron ER (MYRBETRIQ) 25 MG TB24 tablet; Take 1 tablet (25 mg total) by mouth daily.  Dispense: 90  tablet; Refill: 1 - CMP14+EGFR  5. Chronic back pain - HYDROcodone-acetaminophen (NORCO) 10-325 MG tablet; Take 1 tablet by mouth every 6 (six) hours as needed.  Dispense: 90 tablet; Refill: 0 - HYDROcodone-acetaminophen (NORCO) 10-325 MG tablet; Take 1 tablet by mouth every 8 (eight) hours as needed.  Dispense: 90 tablet; Refill: 0 - HYDROcodone-acetaminophen (NORCO) 10-325 MG tablet; Take 1 tablet by mouth every 6 (six) hours as needed.  Dispense: 90 tablet; Refill: 0 - CMP14+EGFR  6. Vitamin D deficiency - CMP14+EGFR  7. Pain medication agreement signed - HYDROcodone-acetaminophen (NORCO) 10-325 MG tablet; Take 1 tablet by mouth every 6 (six) hours as needed.  Dispense: 90 tablet; Refill: 0 - HYDROcodone-acetaminophen (NORCO) 10-325 MG tablet; Take 1 tablet by mouth every 8 (eight) hours as needed.  Dispense: 90 tablet; Refill: 0 - HYDROcodone-acetaminophen (NORCO) 10-325 MG tablet; Take 1 tablet by mouth every 6 (six) hours as needed.  Dispense: 90 tablet; Refill: 0 - CMP14+EGFR  8. Uncomplicated opioid dependence (HCC) - HYDROcodone-acetaminophen (NORCO) 10-325 MG tablet; Take 1 tablet by mouth every 6 (six) hours as needed.  Dispense: 90 tablet; Refill: 0 - HYDROcodone-acetaminophen (NORCO) 10-325 MG tablet; Take 1 tablet by mouth every 8 (eight) hours as needed.  Dispense: 90 tablet; Refill: 0 - HYDROcodone-acetaminophen (NORCO) 10-325 MG tablet; Take 1 tablet by mouth every 6 (six) hours as needed.  Dispense: 90 tablet; Refill: 0 - CMP14+EGFR  9. Insomnia - ALPRAZolam (XANAX) 0.25 MG tablet; Take 1 tablet (0.25 mg total) by mouth at bedtime as needed for anxiety.  Dispense: 30 tablet; Refill: 3 - CMP14+EGFR  10. Hyperlipidemia - CMP14+EGFR  11. GAD (generalized anxiety disorder) - ALPRAZolam (XANAX) 0.25 MG tablet; Take 1 tablet (0.25 mg total) by mouth at bedtime as needed for anxiety.  Dispense: 30 tablet; Refill: 3 - CMP14+EGFR  12. Depression - CMP14+EGFR  13.  Obesity (BMI 30-39.9) - CMP14+EGFR  14. Pain management - HYDROcodone-acetaminophen (NORCO) 10-325 MG tablet; Take 1 tablet by mouth every 6 (six) hours as needed.  Dispense: 90 tablet; Refill: 0 - HYDROcodone-acetaminophen (NORCO) 10-325 MG tablet; Take 1 tablet by mouth every 8 (eight) hours as needed.  Dispense: 90 tablet; Refill: 0 - HYDROcodone-acetaminophen (NORCO) 10-325 MG tablet; Take 1 tablet by mouth every 6 (six) hours as needed.  Dispense: 90 tablet; Refill: 0 - CMP14+EGFR  15. Spinal stenosis of cervical region - HYDROcodone-acetaminophen (NORCO) 10-325 MG tablet; Take 1 tablet by mouth every 8 (eight) hours as needed.  Dispense: 90 tablet; Refill: 0 - CMP14+EGFR   Continue all meds Labs pending Health Maintenance reviewed Diet and exercise encouraged RTO 3 months  Evelina Dun, FNP

## 2016-04-09 LAB — THYROID PANEL WITH TSH
FREE THYROXINE INDEX: 2 (ref 1.2–4.9)
T3 UPTAKE RATIO: 27 % (ref 24–39)
T4, Total: 7.3 ug/dL (ref 4.5–12.0)
TSH: 1.56 u[IU]/mL (ref 0.450–4.500)

## 2016-04-09 LAB — CMP14+EGFR
ALBUMIN: 4.2 g/dL (ref 3.5–4.8)
ALT: 39 IU/L — AB (ref 0–32)
AST: 25 IU/L (ref 0–40)
Albumin/Globulin Ratio: 1.9 (ref 1.2–2.2)
Alkaline Phosphatase: 113 IU/L (ref 39–117)
BILIRUBIN TOTAL: 0.3 mg/dL (ref 0.0–1.2)
BUN/Creatinine Ratio: 24 (ref 12–28)
BUN: 17 mg/dL (ref 8–27)
CHLORIDE: 101 mmol/L (ref 96–106)
CO2: 25 mmol/L (ref 18–29)
CREATININE: 0.72 mg/dL (ref 0.57–1.00)
Calcium: 9.8 mg/dL (ref 8.7–10.3)
GFR calc non Af Amer: 84 mL/min/{1.73_m2} (ref >59)
GFR, EST AFRICAN AMERICAN: 97 mL/min/{1.73_m2} (ref >59)
GLUCOSE: 91 mg/dL (ref 65–99)
Globulin, Total: 2.2 g/dL (ref 1.5–4.5)
Potassium: 4.5 mmol/L (ref 3.5–5.2)
Sodium: 143 mmol/L (ref 134–144)
TOTAL PROTEIN: 6.4 g/dL (ref 6.0–8.5)

## 2016-07-13 ENCOUNTER — Encounter: Payer: Self-pay | Admitting: Family

## 2016-07-13 ENCOUNTER — Ambulatory Visit (INDEPENDENT_AMBULATORY_CARE_PROVIDER_SITE_OTHER): Payer: Medicare HMO | Admitting: Family

## 2016-07-13 VITALS — BP 118/73 | HR 72 | Temp 97.4°F | Ht 65.0 in | Wt 190.0 lb

## 2016-07-13 DIAGNOSIS — F329 Major depressive disorder, single episode, unspecified: Secondary | ICD-10-CM | POA: Diagnosis not present

## 2016-07-13 DIAGNOSIS — E039 Hypothyroidism, unspecified: Secondary | ICD-10-CM | POA: Diagnosis not present

## 2016-07-13 DIAGNOSIS — Z79899 Other long term (current) drug therapy: Secondary | ICD-10-CM

## 2016-07-13 DIAGNOSIS — I10 Essential (primary) hypertension: Secondary | ICD-10-CM | POA: Diagnosis not present

## 2016-07-13 DIAGNOSIS — R52 Pain, unspecified: Secondary | ICD-10-CM

## 2016-07-13 DIAGNOSIS — G890 Central pain syndrome: Secondary | ICD-10-CM

## 2016-07-13 DIAGNOSIS — M549 Dorsalgia, unspecified: Secondary | ICD-10-CM | POA: Diagnosis not present

## 2016-07-13 DIAGNOSIS — G4700 Insomnia, unspecified: Secondary | ICD-10-CM

## 2016-07-13 DIAGNOSIS — Z23 Encounter for immunization: Secondary | ICD-10-CM

## 2016-07-13 DIAGNOSIS — N3281 Overactive bladder: Secondary | ICD-10-CM | POA: Diagnosis not present

## 2016-07-13 DIAGNOSIS — F1120 Opioid dependence, uncomplicated: Secondary | ICD-10-CM

## 2016-07-13 DIAGNOSIS — F411 Generalized anxiety disorder: Secondary | ICD-10-CM | POA: Diagnosis not present

## 2016-07-13 DIAGNOSIS — M4802 Spinal stenosis, cervical region: Secondary | ICD-10-CM

## 2016-07-13 DIAGNOSIS — E669 Obesity, unspecified: Secondary | ICD-10-CM

## 2016-07-13 DIAGNOSIS — R69 Illness, unspecified: Secondary | ICD-10-CM | POA: Diagnosis not present

## 2016-07-13 DIAGNOSIS — E785 Hyperlipidemia, unspecified: Secondary | ICD-10-CM | POA: Diagnosis not present

## 2016-07-13 DIAGNOSIS — F32A Depression, unspecified: Secondary | ICD-10-CM

## 2016-07-13 DIAGNOSIS — G8929 Other chronic pain: Secondary | ICD-10-CM | POA: Diagnosis not present

## 2016-07-13 DIAGNOSIS — I6930 Unspecified sequelae of cerebral infarction: Secondary | ICD-10-CM

## 2016-07-13 DIAGNOSIS — E559 Vitamin D deficiency, unspecified: Secondary | ICD-10-CM | POA: Diagnosis not present

## 2016-07-13 DIAGNOSIS — Z5189 Encounter for other specified aftercare: Secondary | ICD-10-CM

## 2016-07-13 DIAGNOSIS — Z0289 Encounter for other administrative examinations: Secondary | ICD-10-CM

## 2016-07-13 MED ORDER — ALPRAZOLAM 0.25 MG PO TABS: 0.2500 mg | ORAL_TABLET | Freq: Every evening | ORAL | 3 refills | Status: DC | PRN

## 2016-07-13 MED ORDER — HYDROCODONE-ACETAMINOPHEN 10-325 MG PO TABS: 1.0000 | ORAL_TABLET | Freq: Four times a day (QID) | ORAL | 0 refills | Status: DC | PRN

## 2016-07-13 MED ORDER — HYDROCODONE-ACETAMINOPHEN 10-325 MG PO TABS: 1.0000 | ORAL_TABLET | Freq: Three times a day (TID) | ORAL | 0 refills | Status: DC | PRN

## 2016-07-13 MED ORDER — HYDROCODONE-ACETAMINOPHEN 10-325 MG PO TABS
1.0000 | ORAL_TABLET | Freq: Four times a day (QID) | ORAL | 0 refills | Status: DC | PRN
Start: 2016-07-13 — End: 2016-10-05

## 2016-07-13 NOTE — Patient Instructions (Signed)
Health Maintenance, Female Adopting a healthy lifestyle and getting preventive care can go a long way to promote health and wellness. Talk with your health care provider about what schedule of regular examinations is right for you. This is a good chance for you to check in with your provider about disease prevention and staying healthy. In between checkups, there are plenty of things you can do on your own. Experts have done a lot of research about which lifestyle changes and preventive measures are most likely to keep you healthy. Ask your health care provider for more information. WEIGHT AND DIET  Eat a healthy diet  Be sure to include plenty of vegetables, fruits, low-fat dairy products, and lean protein.  Do not eat a lot of foods high in solid fats, added sugars, or salt.  Get regular exercise. This is one of the most important things you can do for your health.  Most adults should exercise for at least 150 minutes each week. The exercise should increase your heart rate and make you sweat (moderate-intensity exercise).  Most adults should also do strengthening exercises at least twice a week. This is in addition to the moderate-intensity exercise.  Maintain a healthy weight  Body mass index (BMI) is a measurement that can be used to identify possible weight problems. It estimates body fat based on height and weight. Your health care provider can help determine your BMI and help you achieve or maintain a healthy weight.  For females 20 years of age and older:   A BMI below 18.5 is considered underweight.  A BMI of 18.5 to 24.9 is normal.  A BMI of 25 to 29.9 is considered overweight.  A BMI of 30 and above is considered obese.  Watch levels of cholesterol and blood lipids  You should start having your blood tested for lipids and cholesterol at 73 years of age, then have this test every 5 years.  You may need to have your cholesterol levels checked more often if:  Your lipid  or cholesterol levels are high.  You are older than 73 years of age.  You are at high risk for heart disease.  CANCER SCREENING   Lung Cancer  Lung cancer screening is recommended for adults 55-80 years old who are at high risk for lung cancer because of a history of smoking.  A yearly low-dose CT scan of the lungs is recommended for people who:  Currently smoke.  Have quit within the past 15 years.  Have at least a 30-pack-year history of smoking. A pack year is smoking an average of one pack of cigarettes a day for 1 year.  Yearly screening should continue until it has been 15 years since you quit.  Yearly screening should stop if you develop a health problem that would prevent you from having lung cancer treatment.  Breast Cancer  Practice breast self-awareness. This means understanding how your breasts normally appear and feel.  It also means doing regular breast self-exams. Let your health care provider know about any changes, no matter how small.  If you are in your 20s or 30s, you should have a clinical breast exam (CBE) by a health care provider every 1-3 years as part of a regular health exam.  If you are 40 or older, have a CBE every year. Also consider having a breast X-ray (mammogram) every year.  If you have a family history of breast cancer, talk to your health care provider about genetic screening.  If you   are at high risk for breast cancer, talk to your health care provider about having an MRI and a mammogram every year.  Breast cancer gene (BRCA) assessment is recommended for women who have family members with BRCA-related cancers. BRCA-related cancers include:  Breast.  Ovarian.  Tubal.  Peritoneal cancers.  Results of the assessment will determine the need for genetic counseling and BRCA1 and BRCA2 testing. Cervical Cancer Your health care provider may recommend that you be screened regularly for cancer of the pelvic organs (ovaries, uterus, and  vagina). This screening involves a pelvic examination, including checking for microscopic changes to the surface of your cervix (Pap test). You may be encouraged to have this screening done every 3 years, beginning at age 21.  For women ages 30-65, health care providers may recommend pelvic exams and Pap testing every 3 years, or they may recommend the Pap and pelvic exam, combined with testing for human papilloma virus (HPV), every 5 years. Some types of HPV increase your risk of cervical cancer. Testing for HPV may also be done on women of any age with unclear Pap test results.  Other health care providers may not recommend any screening for nonpregnant women who are considered low risk for pelvic cancer and who do not have symptoms. Ask your health care provider if a screening pelvic exam is right for you.  If you have had past treatment for cervical cancer or a condition that could lead to cancer, you need Pap tests and screening for cancer for at least 20 years after your treatment. If Pap tests have been discontinued, your risk factors (such as having a new sexual partner) need to be reassessed to determine if screening should resume. Some women have medical problems that increase the chance of getting cervical cancer. In these cases, your health care provider may recommend more frequent screening and Pap tests. Colorectal Cancer  This type of cancer can be detected and often prevented.  Routine colorectal cancer screening usually begins at 73 years of age and continues through 73 years of age.  Your health care provider may recommend screening at an earlier age if you have risk factors for colon cancer.  Your health care provider may also recommend using home test kits to check for hidden blood in the stool.  A small camera at the end of a tube can be used to examine your colon directly (sigmoidoscopy or colonoscopy). This is done to check for the earliest forms of colorectal  cancer.  Routine screening usually begins at age 50.  Direct examination of the colon should be repeated every 5-10 years through 73 years of age. However, you may need to be screened more often if early forms of precancerous polyps or small growths are found. Skin Cancer  Check your skin from head to toe regularly.  Tell your health care provider about any new moles or changes in moles, especially if there is a change in a mole's shape or color.  Also tell your health care provider if you have a mole that is larger than the size of a pencil eraser.  Always use sunscreen. Apply sunscreen liberally and repeatedly throughout the day.  Protect yourself by wearing long sleeves, pants, a wide-brimmed hat, and sunglasses whenever you are outside. HEART DISEASE, DIABETES, AND HIGH BLOOD PRESSURE   High blood pressure causes heart disease and increases the risk of stroke. High blood pressure is more likely to develop in:  People who have blood pressure in the high end   of the normal range (130-139/85-89 mm Hg).  People who are overweight or obese.  People who are African American.  If you are 38-23 years of age, have your blood pressure checked every 3-5 years. If you are 61 years of age or older, have your blood pressure checked every year. You should have your blood pressure measured twice--once when you are at a hospital or clinic, and once when you are not at a hospital or clinic. Record the average of the two measurements. To check your blood pressure when you are not at a hospital or clinic, you can use:  An automated blood pressure machine at a pharmacy.  A home blood pressure monitor.  If you are between 45 years and 39 years old, ask your health care provider if you should take aspirin to prevent strokes.  Have regular diabetes screenings. This involves taking a blood sample to check your fasting blood sugar level.  If you are at a normal weight and have a low risk for diabetes,  have this test once every three years after 73 years of age.  If you are overweight and have a high risk for diabetes, consider being tested at a younger age or more often. PREVENTING INFECTION  Hepatitis B  If you have a higher risk for hepatitis B, you should be screened for this virus. You are considered at high risk for hepatitis B if:  You were born in a country where hepatitis B is common. Ask your health care provider which countries are considered high risk.  Your parents were born in a high-risk country, and you have not been immunized against hepatitis B (hepatitis B vaccine).  You have HIV or AIDS.  You use needles to inject street drugs.  You live with someone who has hepatitis B.  You have had sex with someone who has hepatitis B.  You get hemodialysis treatment.  You take certain medicines for conditions, including cancer, organ transplantation, and autoimmune conditions. Hepatitis C  Blood testing is recommended for:  Everyone born from [birth year] through 1963.  Anyone with known risk factors for hepatitis C. Sexually transmitted infections (STIs)  You should be screened for sexually transmitted infections (STIs) including gonorrhea and chlamydia if:  You are sexually active and are younger than 73 years of age.  You are older than 73 years of age and your health care provider tells you that you are at risk for this type of infection.  Your sexual activity has changed since you were last screened and you are at an increased risk for chlamydia or gonorrhea. Ask your health care provider if you are at risk.  If you do not have HIV, but are at risk, it may be recommended that you take a prescription medicine daily to prevent HIV infection. This is called pre-exposure prophylaxis (PrEP). You are considered at risk if:  You are sexually active and do not regularly use condoms or know the HIV status of your partner(s).  You take drugs by injection.  You are sexually  active with a partner who has HIV. Talk with your health care provider about whether you are at high risk of being infected with HIV. If you choose to begin PrEP, you should first be tested for HIV. You should then be tested every 3 months for as long as you are taking PrEP.  PREGNANCY   If you are premenopausal and you may become pregnant, ask your health care provider about preconception counseling.  If you may  become pregnant, take 400 to 800 micrograms (mcg) of folic acid every day.  If you want to prevent pregnancy, talk to your health care provider about birth control (contraception). OSTEOPOROSIS AND MENOPAUSE   Osteoporosis is a disease in which the bones lose minerals and strength with aging. This can result in serious bone fractures. Your risk for osteoporosis can be identified using a bone density scan.  If you are 61 years of age or older, or if you are at risk for osteoporosis and fractures, ask your health care provider if you should be screened.  Ask your health care provider whether you should take a calcium or vitamin D supplement to lower your risk for osteoporosis.  Menopause may have certain physical symptoms and risks.  Hormone replacement therapy may reduce some of these symptoms and risks. Talk to your health care provider about whether hormone replacement therapy is right for you.  HOME CARE INSTRUCTIONS   Schedule regular health, dental, and eye exams.  Stay current with your immunizations.   Do not use any tobacco products including cigarettes, chewing tobacco, or electronic cigarettes.  If you are pregnant, do not drink alcohol.  If you are breastfeeding, limit how much and how often you drink alcohol.  Limit alcohol intake to no more than 1 drink per day for nonpregnant women. One drink equals 12 ounces of beer, 5 ounces of wine, or 1 ounces of hard liquor.  Do not use street drugs.  Do not share needles.  Ask your health care provider for help if  you need support or information about quitting drugs.  Tell your health care provider if you often feel depressed.  Tell your health care provider if you have ever been abused or do not feel safe at home.   This information is not intended to replace advice given to you by your health care provider. Make sure you discuss any questions you have with your health care provider.   Document Released: 04/26/2011 Document Revised: 11/01/2014 Document Reviewed: 09/12/2013 Elsevier Interactive Patient Education [employer].

## 2016-07-13 NOTE — Addendum Note (Signed)
Addended by: Shelbie Ammons on: 07/13/2016 11:58 AM   Modules accepted: Orders

## 2016-07-13 NOTE — Progress Notes (Signed)
Subjective:    Patient ID: Allison Parker, female    DOB: 03/28/1941, 75 y.o.   MRN: [id number]  Pt presents to the office today for chronic follow up. 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 PT states this is stable at this time.    Hyperlipidemia  This is a chronic problem. The current episode started more than 1 year ago. The problem is controlled. Recent lipid tests were reviewed and are normal. Exacerbating diseases include hypothyroidism and obesity. She has no history of diabetes. Pertinent negatives include no leg pain or myalgias. Current antihyperlipidemic treatment includes statins. The current treatment provides moderate improvement of lipids. Risk factors for coronary artery disease include dyslipidemia, hypertension, obesity, post-menopausal, a sedentary lifestyle and stress.  Medication Refill  Pertinent negatives include no headaches, myalgias, visual change or weakness.  Hypertension  This is a chronic problem. The current episode started more than 1 year ago. The problem has been resolved since onset. The problem is controlled. Associated symptoms include anxiety and peripheral edema ("at times and more in right leg"). Pertinent negatives include no blurred vision, headaches or palpitations. Risk factors for coronary artery disease include dyslipidemia, obesity and sedentary lifestyle. Past treatments include calcium channel blockers. The current treatment provides moderate improvement. Hypertensive end-organ damage includes CVA and a thyroid problem. There is no history of kidney disease, CAD/MI or heart failure. There is no history of sleep apnea.  Anxiety  Presents for follow-up visit. Onset was more than 5 years ago. The problem has been waxing and waning. Symptoms include excessive worry, insomnia, irritability and nervous/anxious behavior. Patient reports no depressed mood, muscle tension or palpitations. Symptoms occur most days. The symptoms are  aggravated by family issues (Son died in 07/27/15). The quality of sleep is fair.   Her past medical history is significant for anxiety/panic attacks, CAD and depression. Past treatments include SSRIs and benzodiazephines.  Thyroid Problem  Presents for follow-up visit. Symptoms include anxiety and dry skin. Patient reports no depressed mood, diarrhea, hair loss, hoarse voice, palpitations or visual change. The symptoms have been stable. Past treatments include levothyroxine. The treatment provided significant relief. Her past medical history is significant for hyperlipidemia. There is no history of diabetes or heart failure.  Insomnia  Primary symptoms: fragmented sleep, sleep disturbance, frequent awakening.  The current episode started more than one year. The onset quality is gradual. The problem occurs nightly. The problem has been waxing and waning since onset. The symptoms are aggravated by family issues and anxiety. The symptoms are relieved by darkened room, quiet environment and relaxation. Past treatments include meditation.  Back Pain  This is a chronic problem. The current episode started more than 1 year ago. The problem occurs constantly. The problem has been waxing and waning since onset. The pain is present in the lumbar spine. The quality of the pain is described as aching. The pain is at a severity of 7/10. The pain is worse during the night. Pertinent negatives include no bladder incontinence, bowel incontinence, headaches, leg pain, tingling or weakness. Risk factors include obesity. She has tried NSAIDs, analgesics and bed rest for the symptoms. The treatment provided mild relief.  [facility] PT states this is doing better now that she is taking  the Myrbetriq daily.    Review of Systems  Constitutional: Positive for irritability.  HENT: Negative.  Negative for hoarse voice.   Eyes: Negative.  Negative for blurred vision.  Respiratory: Negative.  Cardiovascular: Negative.  Negative  for palpitations.  Gastrointestinal: Negative.  Negative for bowel incontinence and diarrhea.  Endocrine: Negative.   Genitourinary: Negative.  Negative for bladder incontinence.  Musculoskeletal: Positive for back pain. Negative for myalgias.  Neurological: Negative.  Negative for tingling, weakness and headaches.  Hematological: Negative.   Psychiatric/Behavioral: Positive for sleep disturbance. The patient is nervous/anxious and has insomnia.   All other systems reviewed and are negative.      Objective:   Physical Exam  Constitutional: She is oriented to person, place, and time. She appears well-developed and well-nourished. No distress.  HENT:  Head: Normocephalic and atraumatic.  Right Ear: External ear normal.  Left Ear: External ear normal.  Nose: Nose normal.  Mouth/Throat: Oropharynx is clear and moist.  Eyes: Pupils are equal, round, and reactive to light.  Neck: Normal range of motion. Neck supple. No thyromegaly present.  Cardiovascular: Normal rate, regular rhythm, normal heart sounds and intact distal pulses.   No murmur heard. Pulmonary/Chest: Effort normal and breath sounds normal. No respiratory distress. She has no wheezes.  Abdominal: Soft. Bowel sounds are normal. She exhibits no distension. There is no tenderness.  Musculoskeletal: Normal range of motion. She exhibits edema (trace amt in right leg and foot). She exhibits no tenderness.  Neurological: She is alert and oriented to person, place, and time. She has normal reflexes. No cranial nerve deficit.  Skin: Skin is warm and dry.  Psychiatric: She has a normal mood and affect. Her behavior is normal. Judgment and thought content normal.  Vitals reviewed.     BP 118/73   Pulse 72   Temp 97.4 F (36.3 C) (Oral)   Ht 5' 5"  (1.651 m)   Wt 190 lb (86.2 kg)   BMI 31.62 kg/m      Assessment & Plan:  1. Essential hypertension, benign - CMP14+EGFR  2. Hypothyroidism, unspecified hypothyroidism type -  CMP14+EGFR - Thyroid Panel With TSH  3. Overactive bladde - CMP14+EGFR  4. Chronic back pain  - HYDROcodone-acetaminophen (NORCO) 10-325 MG tablet; Take 1 tablet by mouth every 6 (six) hours as needed.  Dispense: 90 tablet; Refill: 0 - HYDROcodone-acetaminophen (NORCO) 10-325 MG tablet; Take 1 tablet by mouth every 8 (eight) hours as needed.  Dispense: 90 tablet; Refill: 0 - HYDROcodone-acetaminophen (NORCO) 10-325 MG tablet; Take 1 tablet by mouth every 6 (six) hours as needed.  Dispense: 90 tablet; Refill: 0 - CMP14+EGFR  5. Depression - CMP14+EGFR  6. GAD (generalized anxiety disorder) - ALPRAZolam (XANAX) 0.25 MG tablet; Take 1 tablet (0.25 mg total) by mouth at bedtime as needed for anxiety.  Dispense: 30 tablet; Refill: 3 - CMP14+EGFR  7. Hyperlipidemia  - CMP14+EGFR  8. Vitamin D deficiency - CMP14+EGFR  9. Pain medication agreement signed - HYDROcodone-acetaminophen (NORCO) 10-325 MG tablet; Take 1 tablet by mouth every 6 (six) hours as needed.  Dispense: 90 tablet; Refill: 0 - HYDROcodone-acetaminophen (NORCO) 10-325 MG tablet; Take 1 tablet by mouth every 8 (eight) hours as needed.  Dispense: 90 tablet; Refill: 0 - HYDROcodone-acetaminophen (NORCO) 10-325 MG tablet; Take 1 tablet by mouth every 6 (six) hours as needed.  Dispense: 90 tablet; Refill: 0 - CMP14+EGFR  10. Uncomplicated opioid dependence (HCC)  - HYDROcodone-acetaminophen (NORCO) 10-325 MG tablet; Take 1 tablet by mouth every 6 (six) hours as needed.  Dispense: 90 tablet; Refill: 0 - HYDROcodone-acetaminophen (NORCO) 10-325 MG tablet; Take 1 tablet by mouth every 8 (eight) hours as needed.  Dispense: 90 tablet; Refill: 0 - HYDROcodone-acetaminophen (  NORCO) 10-325 MG tablet; Take 1 tablet by mouth every 6 (six) hours as needed.  Dispense: 90 tablet; Refill: 0 - CMP14+EGFR  11. Obesity (BMI 30-39.9) - CMP14+EGFR  12. Insomnia  - ALPRAZolam (XANAX) 0.25 MG tablet; Take 1 tablet (0.25 mg total) by mouth  at bedtime as needed for anxiety.  Dispense: 30 tablet; Refill: 3 - CMP14+EGFR  13. Late effect of cerebrovascular accident - CMP14+EGFR  14. Pain management  - HYDROcodone-acetaminophen (NORCO) 10-325 MG tablet; Take 1 tablet by mouth every 6 (six) hours as needed.  Dispense: 90 tablet; Refill: 0 - HYDROcodone-acetaminophen (NORCO) 10-325 MG tablet; Take 1 tablet by mouth every 8 (eight) hours as needed.  Dispense: 90 tablet; Refill: 0 - HYDROcodone-acetaminophen (NORCO) 10-325 MG tablet; Take 1 tablet by mouth every 6 (six) hours as needed.  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  Dispense: 90 tablet; Refill: 0 - HYDROcodone-acetaminophen (NORCO) 10-325 MG tablet; Take 1 tablet by mouth every 8 (eight) hours as needed.  Dispense: 90 tablet; Refill: 0 - HYDROcodone-acetaminophen (NORCO) 10-325 MG tablet; Take 1 tablet by mouth every 6 (six) hours as needed.  Dispense: 90 tablet; Refill: 0 - CMP14+EGFR  16. Spinal stenosis of cervical region - HYDROcodone-acetaminophen (NORCO) 10-325 MG tablet; Take 1 tablet by mouth every 8 (eight) hours as needed.  Dispense: 90 tablet; Refill: 0 - CMP14+EGFR  [place] controlled database reviewed- Pt only received medications from me.   Continue all meds Labs pending Health Maintenance reviewed- Flu and Pneumonia 13 given today Diet and exercise encouraged RTO 3 months  Evelina Dun, FNP

## 2016-07-14 LAB — CMP14+EGFR
A/G RATIO: 1.9 (ref 1.2–2.2)
ALK PHOS: 109 IU/L (ref 39–117)
ALT: 36 IU/L — ABNORMAL HIGH (ref 0–32)
AST: 30 IU/L (ref 0–40)
Albumin: 4.4 g/dL (ref 3.5–4.8)
BUN/Creatinine Ratio: 25 (ref 12–28)
BUN: 19 mg/dL (ref 8–27)
Bilirubin Total: 0.4 mg/dL (ref 0.0–1.2)
CALCIUM: 9.7 mg/dL (ref 8.7–10.3)
CO2: 30 mmol/L — ABNORMAL HIGH (ref 18–29)
Chloride: 98 mmol/L (ref 96–106)
Creatinine, Ser: 0.75 mg/dL (ref 0.57–1.00)
GFR calc Af Amer: 92 mL/min/{1.73_m2} (ref >59)
GFR, EST NON AFRICAN AMERICAN: 80 mL/min/{1.73_m2} (ref >59)
GLOBULIN, TOTAL: 2.3 g/dL (ref 1.5–4.5)
Glucose: 75 mg/dL (ref 65–99)
POTASSIUM: 4.8 mmol/L (ref 3.5–5.2)
SODIUM: 140 mmol/L (ref 134–144)
Total Protein: 6.7 g/dL (ref 6.0–8.5)

## 2016-07-14 LAB — THYROID PANEL WITH TSH
FREE THYROXINE INDEX: 1.8 (ref 1.2–4.9)
T3 Uptake Ratio: 25 % (ref 24–39)
T4 TOTAL: 7.1 ug/dL (ref 4.5–12.0)
TSH: 1.43 u[IU]/mL (ref 0.450–4.500)

## 2016-08-10 DIAGNOSIS — H353111 Nonexudative age-related macular degeneration, right eye, early dry stage: Secondary | ICD-10-CM | POA: Diagnosis not present

## 2016-08-10 DIAGNOSIS — H353121 Nonexudative age-related macular degeneration, left eye, early dry stage: Secondary | ICD-10-CM | POA: Diagnosis not present

## 2016-08-10 DIAGNOSIS — H35372 Puckering of macula, left eye: Secondary | ICD-10-CM | POA: Diagnosis not present

## 2016-08-19 DIAGNOSIS — D1800 Hemangioma unspecified site: Secondary | ICD-10-CM | POA: Diagnosis not present

## 2016-08-19 DIAGNOSIS — L814 Other melanin hyperpigmentation: Secondary | ICD-10-CM | POA: Diagnosis not present

## 2016-08-19 DIAGNOSIS — D229 Melanocytic nevi, unspecified: Secondary | ICD-10-CM | POA: Diagnosis not present

## 2016-08-19 DIAGNOSIS — L570 Actinic keratosis: Secondary | ICD-10-CM | POA: Diagnosis not present

## 2016-08-19 DIAGNOSIS — Z23 Encounter for immunization: Secondary | ICD-10-CM | POA: Diagnosis not present

## 2016-09-22 DIAGNOSIS — Z1231 Encounter for screening mammogram for malignant neoplasm of breast: Secondary | ICD-10-CM | POA: Diagnosis not present

## 2016-10-05 ENCOUNTER — Encounter: Payer: Self-pay | Admitting: Family

## 2016-10-05 ENCOUNTER — Ambulatory Visit (INDEPENDENT_AMBULATORY_CARE_PROVIDER_SITE_OTHER): Payer: Medicare HMO | Admitting: Family

## 2016-10-05 VITALS — BP 125/73 | HR 75 | Temp 97.8°F | Ht 65.0 in | Wt 194.4 lb

## 2016-10-05 DIAGNOSIS — M5442 Lumbago with sciatica, left side: Secondary | ICD-10-CM

## 2016-10-05 DIAGNOSIS — N3281 Overactive bladder: Secondary | ICD-10-CM

## 2016-10-05 DIAGNOSIS — F411 Generalized anxiety disorder: Secondary | ICD-10-CM

## 2016-10-05 DIAGNOSIS — E785 Hyperlipidemia, unspecified: Secondary | ICD-10-CM

## 2016-10-05 DIAGNOSIS — F331 Major depressive disorder, recurrent, moderate: Secondary | ICD-10-CM

## 2016-10-05 DIAGNOSIS — G8929 Other chronic pain: Secondary | ICD-10-CM | POA: Diagnosis not present

## 2016-10-05 DIAGNOSIS — E559 Vitamin D deficiency, unspecified: Secondary | ICD-10-CM

## 2016-10-05 DIAGNOSIS — G4700 Insomnia, unspecified: Secondary | ICD-10-CM | POA: Diagnosis not present

## 2016-10-05 DIAGNOSIS — Z0289 Encounter for other administrative examinations: Secondary | ICD-10-CM | POA: Diagnosis not present

## 2016-10-05 DIAGNOSIS — R52 Pain, unspecified: Secondary | ICD-10-CM

## 2016-10-05 DIAGNOSIS — I10 Essential (primary) hypertension: Secondary | ICD-10-CM

## 2016-10-05 DIAGNOSIS — R69 Illness, unspecified: Secondary | ICD-10-CM | POA: Diagnosis not present

## 2016-10-05 DIAGNOSIS — F1120 Opioid dependence, uncomplicated: Secondary | ICD-10-CM

## 2016-10-05 DIAGNOSIS — G890 Central pain syndrome: Secondary | ICD-10-CM

## 2016-10-05 DIAGNOSIS — E669 Obesity, unspecified: Secondary | ICD-10-CM

## 2016-10-05 DIAGNOSIS — M4802 Spinal stenosis, cervical region: Secondary | ICD-10-CM

## 2016-10-05 DIAGNOSIS — E039 Hypothyroidism, unspecified: Secondary | ICD-10-CM

## 2016-10-05 DIAGNOSIS — I6930 Unspecified sequelae of cerebral infarction: Secondary | ICD-10-CM

## 2016-10-05 MED ORDER — HYDROCODONE-ACETAMINOPHEN 10-325 MG PO TABS: 1.0000 | ORAL_TABLET | Freq: Four times a day (QID) | ORAL | 0 refills | Status: DC | PRN

## 2016-10-05 MED ORDER — HYDROCODONE-ACETAMINOPHEN 10-325 MG PO TABS: 1.0000 | ORAL_TABLET | Freq: Three times a day (TID) | ORAL | 0 refills | Status: DC | PRN

## 2016-10-05 NOTE — Progress Notes (Signed)
Subjective:    Patient ID: Allison Parker, female    DOB: [birth year], 75 y.o.   MRN: [id number]  Pt presents to the office today for chronic follow up. 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 PT states this is stable at this time.    Hypertension  This is a chronic problem. The current episode started more than 1 year ago. The problem has been resolved since onset. The problem is controlled. Associated symptoms include anxiety and peripheral edema ("at times and more in right leg"). Pertinent negatives include no blurred vision or palpitations. Risk factors for coronary artery disease include dyslipidemia, obesity and sedentary lifestyle. Past treatments include calcium channel blockers. The current treatment provides moderate improvement. Hypertensive end-organ damage includes CVA and a thyroid problem. There is no history of kidney disease, CAD/MI or heart failure. There is no history of sleep apnea.  Hyperlipidemia  This is a chronic problem. The current episode started more than 1 year ago. The problem is controlled. Recent lipid tests were reviewed and are normal. Exacerbating diseases include hypothyroidism and obesity. She has no history of diabetes. Pertinent negatives include no leg pain. Current antihyperlipidemic treatment includes statins. The current treatment provides moderate improvement of lipids. Risk factors for coronary artery disease include dyslipidemia, hypertension, obesity, post-menopausal, a sedentary lifestyle and stress.  Anxiety  Presents for follow-up visit. Onset was more than 5 years ago. The problem has been waxing and waning. Symptoms include excessive worry, insomnia, irritability and nervous/anxious behavior. Patient reports no depressed mood, muscle tension or palpitations. Symptoms occur most days. The symptoms are aggravated by family issues (Son died in 11-Aug-2015). The quality of sleep is fair.   Her past medical history is  significant for anxiety/panic attacks, CAD and depression. Past treatments include SSRIs and benzodiazephines.  Thyroid Problem  Presents for follow-up visit. Symptoms include anxiety and dry skin. Patient reports no depressed mood, diarrhea, hair loss, hoarse voice or palpitations. The symptoms have been stable. Past treatments include levothyroxine. The treatment provided significant relief. Her past medical history is significant for hyperlipidemia. There is no history of diabetes or heart failure.  Insomnia  Primary symptoms: fragmented sleep, sleep disturbance, frequent awakening.  The current episode started more than one year. The onset quality is gradual. The problem occurs nightly. The problem has been waxing and waning since onset. The symptoms are aggravated by family issues and anxiety. The symptoms are relieved by darkened room, quiet environment and relaxation. Past treatments include meditation.  Back Pain  This is a chronic problem. The current episode started more than 1 year ago. The problem occurs constantly. The problem has been waxing and waning since onset. The pain is present in the lumbar spine. The quality of the pain is described as aching. The pain is at a severity of 9/10. The pain is worse during the night. Pertinent negatives include no bladder incontinence, bowel incontinence, leg pain or tingling. Risk factors include obesity. She has tried NSAIDs, analgesics and bed rest for the symptoms. The treatment provided mild relief.  [facility] PT states this is doing better now that she is taking  the Myrbetriq daily.   Pain assessment: Cause of pain- Chronic back pain Pain location-Low back pain with sciatic in left leg Pain on scale of 1-10- 9 Frequency- Constant What increases pain-Standing and bending What makes pain Better-Rest and pain medication  Current medications- Norco 10-372m every 6 hours as needed #90  Effectiveness of  current meds-Continues to have pain  Pill  count performed-No Urine drug screen- Yes, 01/05/16 Was the [place] reviewed- Yes  If yes were their any concerning findings? - Has only received controlled medication from me.    Review of Systems  Constitutional: Positive for irritability.  HENT: Negative.  Negative for hoarse voice.   Eyes: Negative.  Negative for blurred vision.  Respiratory: Negative.   Cardiovascular: Negative.  Negative for palpitations.  Gastrointestinal: Negative.  Negative for bowel incontinence and diarrhea.  Endocrine: Negative.   Genitourinary: Negative.  Negative for bladder incontinence.  Musculoskeletal: Positive for back pain.  Neurological: Negative.  Negative for tingling.  Hematological: Negative.   Psychiatric/Behavioral: Positive for sleep disturbance. The patient is nervous/anxious and has insomnia.   All other systems reviewed and are negative.      Objective:   Physical Exam  Constitutional: She is oriented to person, place, and time. She appears well-developed and well-nourished. No distress.  HENT:  Head: Normocephalic and atraumatic.  Right Ear: External ear normal.  Left Ear: External ear normal.  Nose: Nose normal.  Mouth/Throat: Oropharynx is clear and moist.  Eyes: Pupils are equal, round, and reactive to light.  Neck: Normal range of motion. Neck supple. No thyromegaly present.  Cardiovascular: Normal rate, regular rhythm, normal heart sounds and intact distal pulses.   No murmur heard. Pulmonary/Chest: Effort normal and breath sounds normal. No respiratory distress. She has no wheezes.  Abdominal: Soft. Bowel sounds are normal. She exhibits no distension. There is no tenderness.  Musculoskeletal: Normal range of motion. She exhibits edema (trace amt in right leg and foot). She exhibits no tenderness.  Neurological: She is alert and oriented to person, place, and time. She has normal reflexes. No cranial nerve deficit.  Skin: Skin is warm and dry.  Psychiatric: She has a normal  mood and affect. Her behavior is normal. Judgment and thought content normal.  Vitals reviewed.     BP 125/73   Pulse 75   Temp 97.8 F (36.6 C) (Oral)   Ht 5' 5"  (1.651 m)   Wt 194 lb 6.4 oz (88.2 kg)   BMI 32.35 kg/m      Assessment & Plan:  1. Essential hypertension, benign - CMP14+EGFR  2. Hypothyroidism, unspecified type - CMP14+EGFR  3. Thalamic pain syndrome - CMP14+EGFR - HYDROcodone-acetaminophen (NORCO) 10-325 MG tablet; Take 1 tablet by mouth every 8 (eight) hours as needed.  Dispense: 90 tablet; Refill: 0 - HYDROcodone-acetaminophen (NORCO) 10-325 MG tablet; Take 1 tablet by mouth every 6 (six) hours as needed.  Dispense: 90 tablet; Refill: 0 - HYDROcodone-acetaminophen (NORCO) 10-325 MG tablet; Take 1 tablet by mouth every 6 (six) hours as needed.  Dispense: 90 tablet; Refill: 0  4. 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  Dispense: 90 tablet; Refill: 0 - HYDROcodone-acetaminophen (NORCO) 10-325 MG tablet; Take 1 tablet by mouth every 6 (six) hours as needed.  Dispense: 90 tablet; Refill: 0 - HYDROcodone-acetaminophen (NORCO) 10-325 MG tablet; Take 1 tablet by mouth every 6 (six) hours as needed.  Dispense: 90 tablet; Refill: 0  6. GAD (generalized anxiety disorder) - CMP14+EGFR  7. Moderate episode of recurrent major depressive disorder (HCC) - CMP14+EGFR  8. Late effect of cerebrovascular accident - CMP14+EGFR  9. Insomnia, unspecified type - CMP14+EGFR  10. Hyperlipidemia, unspecified hyperlipidemia type - CMP14+EGFR  11.  Vitamin D deficiency  - CMP14+EGFR  12. Pain medication agreement signed - CMP14+EGFR - HYDROcodone-acetaminophen (NORCO) 10-325 MG tablet; Take 1 tablet by mouth every 8 (eight) hours as needed.  Dispense: 90 tablet; Refill: 0 -  HYDROcodone-acetaminophen (NORCO) 10-325 MG tablet; Take 1 tablet by mouth every 6 (six) hours as needed.  Dispense: 90 tablet; Refill: 0 - HYDROcodone-acetaminophen (NORCO) 10-325 MG tablet; Take 1 tablet by mouth every 6 (six) hours as needed.  Dispense: 90 tablet; Refill: 0  13. Uncomplicated opioid dependence (HCC) - CMP14+EGFR - HYDROcodone-acetaminophen (NORCO) 10-325 MG tablet; Take 1 tablet by mouth every 8 (eight) hours as needed.  Dispense: 90 tablet; Refill: 0 - HYDROcodone-acetaminophen (NORCO) 10-325 MG tablet; Take 1 tablet by mouth every 6 (six) hours as needed.  Dispense: 90 tablet; Refill: 0 - HYDROcodone-acetaminophen (NORCO) 10-325 MG tablet; Take 1 tablet by mouth every 6 (six) hours as needed.  Dispense: 90 tablet; Refill: 0  14. Obesity (BMI 30-39.9) - CMP14+EGFR  15. Spinal stenosis of cervical region - CMP14+EGFR - HYDROcodone-acetaminophen (NORCO) 10-325 MG tablet; Take 1 tablet by mouth every 8 (eight) hours as needed.  Dispense: 90 tablet; Refill: 0  16. Pain management - CMP14+EGFR - HYDROcodone-acetaminophen (NORCO) 10-325 MG tablet; Take 1 tablet by mouth every 8 (eight) hours as needed.  Dispense: 90 tablet; Refill: 0 - HYDROcodone-acetaminophen (NORCO) 10-325 MG tablet; Take 1 tablet by mouth every 6 (six) hours as needed.  Dispense: 90 tablet; Refill: 0 - HYDROcodone-acetaminophen (NORCO) 10-325 MG tablet; Take 1 tablet by mouth every 6 (six) hours as needed.  Dispense: 90 tablet; Refill: 0   Continue all meds Labs pending Health Maintenance reviewed Diet and exercise encouraged RTO 3 months   Evelina Dun, FNP

## 2016-10-05 NOTE — Patient Instructions (Signed)
Chronic Back Pain When back pain lasts longer than 3 months, it is called chronic back pain.The cause of your back pain may not be known. Some common causes include:  Wear and tear (degenerative disease) of the bones, ligaments, or disks in your back.  Inflammation and stiffness in your back (arthritis). People who have chronic back pain often go through certain periods in which the pain is more intense (flare-ups). Many people can learn to manage the pain with home care. Follow these instructions at home: Pay attention to any changes in your symptoms. Take these actions to help with your pain: Activity   Avoid bending and activities that make the problem worse.  Do not sit or stand in one place for long periods of time.  Take brief periods of rest throughout the day. This will reduce your pain. Resting in a lying or standing position is usually better than sitting to rest.  When you are resting for longer periods, mix in some mild activity or stretching between periods of rest. This will help to prevent stiffness and pain.  Get regular exercise. Ask your health care provider what activities are safe for you.  Do not lift anything that is heavier than 10 lb (4.5 kg). Always use proper lifting technique, which includes:  Bending your knees.  Keeping the load close to your body.  Avoiding twisting. Managing pain   If directed, apply ice to the painful area. Your health care provider may recommend applying ice during the first 24-48 hours after a flare-up begins.  Put ice in a plastic bag.  Place a towel between your skin and the bag.  Leave the ice on for 20 minutes, 2-3 times per day.  After icing, apply heat to the affected area as often as told by your health care provider. Use the heat source that your health care provider recommends, such as a moist heat pack or a heating pad.  Place a towel between your skin and the heat source.  Leave the heat on for 20-30  minutes.  Remove the heat if your skin turns bright red. This is especially important if you are unable to feel pain, heat, or cold. You may have a greater risk of getting burned.  Try soaking in a warm tub.  Take over-the-counter and prescription medicines only as told by your health care provider.  Keep all follow-up visits as told by your health care provider. This is important. Contact a health care provider if:  You have pain that is not relieved with rest or medicine. Get help right away if:  You have weakness or numbness in one or both of your legs or feet.  You have trouble controlling your bladder or your bowels.  You have nausea or vomiting.  You have pain in your abdomen.  You have shortness of breath or you faint. This information is not intended to replace advice given to you by your health care provider. Make sure you discuss any questions you have with your health care provider. Document Released: 11/18/2004 Document Revised: 02/19/2016 Document Reviewed: 03/31/2015 Elsevier Interactive Patient Education  2017 [employer]  

## 2016-10-06 LAB — CMP14+EGFR
A/G RATIO: 2.5 — AB (ref 1.2–2.2)
ALT: 17 IU/L (ref 0–32)
AST: 20 IU/L (ref 0–40)
Albumin: 4.5 g/dL (ref 3.5–4.8)
Alkaline Phosphatase: 93 IU/L (ref 39–117)
BUN/Creatinine Ratio: 25 (ref 12–28)
BUN: 19 mg/dL (ref 8–27)
Bilirubin Total: 0.4 mg/dL (ref 0.0–1.2)
CALCIUM: 9.7 mg/dL (ref 8.7–10.3)
CHLORIDE: 100 mmol/L (ref 96–106)
CO2: 24 mmol/L (ref 18–29)
Creatinine, Ser: 0.77 mg/dL (ref 0.57–1.00)
GFR calc Af Amer: 89 mL/min/{1.73_m2} (ref >59)
GFR, EST NON AFRICAN AMERICAN: 77 mL/min/{1.73_m2} (ref >59)
GLUCOSE: 107 mg/dL — AB (ref 65–99)
Globulin, Total: 1.8 g/dL (ref 1.5–4.5)
POTASSIUM: 4.5 mmol/L (ref 3.5–5.2)
Sodium: 140 mmol/L (ref 134–144)
Total Protein: 6.3 g/dL (ref 6.0–8.5)

## 2016-12-29 DIAGNOSIS — M25561 Pain in right knee: Secondary | ICD-10-CM | POA: Diagnosis not present

## 2016-12-30 ENCOUNTER — Ambulatory Visit (INDEPENDENT_AMBULATORY_CARE_PROVIDER_SITE_OTHER): Payer: Medicare HMO | Admitting: Family

## 2016-12-30 ENCOUNTER — Encounter: Payer: Self-pay | Admitting: Family

## 2016-12-30 VITALS — BP 133/71 | HR 83 | Temp 98.8°F | Ht 65.0 in | Wt 194.0 lb

## 2016-12-30 DIAGNOSIS — G890 Central pain syndrome: Secondary | ICD-10-CM

## 2016-12-30 DIAGNOSIS — I10 Essential (primary) hypertension: Secondary | ICD-10-CM

## 2016-12-30 DIAGNOSIS — M5442 Lumbago with sciatica, left side: Secondary | ICD-10-CM | POA: Diagnosis not present

## 2016-12-30 DIAGNOSIS — E559 Vitamin D deficiency, unspecified: Secondary | ICD-10-CM | POA: Diagnosis not present

## 2016-12-30 DIAGNOSIS — R52 Pain, unspecified: Secondary | ICD-10-CM

## 2016-12-30 DIAGNOSIS — F411 Generalized anxiety disorder: Secondary | ICD-10-CM

## 2016-12-30 DIAGNOSIS — R69 Illness, unspecified: Secondary | ICD-10-CM | POA: Diagnosis not present

## 2016-12-30 DIAGNOSIS — N3281 Overactive bladder: Secondary | ICD-10-CM | POA: Diagnosis not present

## 2016-12-30 DIAGNOSIS — F331 Major depressive disorder, recurrent, moderate: Secondary | ICD-10-CM | POA: Diagnosis not present

## 2016-12-30 DIAGNOSIS — G8929 Other chronic pain: Secondary | ICD-10-CM | POA: Diagnosis not present

## 2016-12-30 DIAGNOSIS — M4802 Spinal stenosis, cervical region: Secondary | ICD-10-CM

## 2016-12-30 DIAGNOSIS — G4700 Insomnia, unspecified: Secondary | ICD-10-CM

## 2016-12-30 DIAGNOSIS — F1120 Opioid dependence, uncomplicated: Secondary | ICD-10-CM

## 2016-12-30 DIAGNOSIS — I6930 Unspecified sequelae of cerebral infarction: Secondary | ICD-10-CM | POA: Diagnosis not present

## 2016-12-30 DIAGNOSIS — E669 Obesity, unspecified: Secondary | ICD-10-CM

## 2016-12-30 DIAGNOSIS — E039 Hypothyroidism, unspecified: Secondary | ICD-10-CM | POA: Diagnosis not present

## 2016-12-30 DIAGNOSIS — Z0289 Encounter for other administrative examinations: Secondary | ICD-10-CM | POA: Diagnosis not present

## 2016-12-30 DIAGNOSIS — E785 Hyperlipidemia, unspecified: Secondary | ICD-10-CM | POA: Diagnosis not present

## 2016-12-30 MED ORDER — ALPRAZOLAM 0.25 MG PO TABS: 0.2500 mg | ORAL_TABLET | Freq: Every evening | ORAL | 3 refills | Status: DC | PRN

## 2016-12-30 MED ORDER — SOLIFENACIN SUCCINATE 10 MG PO TABS: 10.0000 mg | ORAL_TABLET | Freq: Every day | ORAL | 1 refills | Status: DC

## 2016-12-30 MED ORDER — HYDROCODONE-ACETAMINOPHEN 10-325 MG PO TABS: 1.0000 | ORAL_TABLET | Freq: Four times a day (QID) | ORAL | 0 refills | Status: DC | PRN

## 2016-12-30 MED ORDER — MELOXICAM 7.5 MG PO TABS: 7.5000 mg | ORAL_TABLET | Freq: Every day | ORAL | 1 refills | Status: DC

## 2016-12-30 MED ORDER — HYDROCODONE-ACETAMINOPHEN 10-325 MG PO TABS: 1.0000 | ORAL_TABLET | Freq: Three times a day (TID) | ORAL | 0 refills | Status: DC | PRN

## 2016-12-30 NOTE — Progress Notes (Signed)
Subjective:    Patient ID: Allison Parker, female    DOB: 1941/04/08, 76 y.o.   MRN: [id number]  Pt presents to the office today for chronic follow up. 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 PT states this is stable at this time.    Hypertension  This is a chronic problem. The current episode started more than 1 year ago. The problem has been resolved since onset. The problem is controlled. Associated symptoms include anxiety and peripheral edema ("at times and more in right leg"). Pertinent negatives include no blurred vision or palpitations. Risk factors for coronary artery disease include dyslipidemia, obesity and sedentary lifestyle. Past treatments include calcium channel blockers. The current treatment provides moderate improvement. Hypertensive end-organ damage includes CVA. There is no history of kidney disease, CAD/MI or heart failure. Identifiable causes of hypertension include a thyroid problem. There is no history of sleep apnea.  Hyperlipidemia  This is a chronic problem. The current episode started more than 1 year ago. The problem is controlled. Recent lipid tests were reviewed and are normal. Exacerbating diseases include hypothyroidism and obesity. She has no history of diabetes. Pertinent negatives include no leg pain. Current antihyperlipidemic treatment includes statins. The current treatment provides moderate improvement of lipids. Risk factors for coronary artery disease include dyslipidemia, hypertension, obesity, post-menopausal, a sedentary lifestyle and stress.  Anxiety  Presents for follow-up visit. Onset was more than 5 years ago. The problem has been waxing and waning. Symptoms include excessive worry, insomnia, irritability and nervous/anxious behavior. Patient reports no depressed mood, muscle tension or palpitations. Symptoms occur most days. The symptoms are aggravated by family issues (Son died in 08/08/2015). The quality of sleep is fair.     Her past medical history is significant for anxiety/panic attacks, CAD and depression. Past treatments include SSRIs and benzodiazephines.  Thyroid Problem  Presents for follow-up visit. Symptoms include anxiety and dry skin. Patient reports no depressed mood, diarrhea, hair loss, hoarse voice or palpitations. The symptoms have been stable. Past treatments include levothyroxine. The treatment provided significant relief. Her past medical history is significant for hyperlipidemia. There is no history of diabetes or heart failure.  Insomnia  Primary symptoms: fragmented sleep, sleep disturbance, frequent awakening.  The current episode started more than one year. The onset quality is gradual. The problem occurs nightly. The problem has been waxing and waning since onset. The symptoms are aggravated by family issues and anxiety. The symptoms are relieved by darkened room, quiet environment and relaxation. Past treatments include meditation.  Back Pain  This is a chronic problem. The current episode started more than 1 year ago. The problem occurs constantly. The problem has been waxing and waning since onset. The pain is present in the lumbar spine. The quality of the pain is described as aching. The pain is at a severity of 9/10. The pain is worse during the night. Pertinent negatives include no bladder incontinence, bowel incontinence, leg pain or tingling. Risk factors include obesity. She has tried NSAIDs, analgesics and bed rest for the symptoms. The treatment provided mild relief.  [facility] PT states this is doing better when she was taking her myrbetriq daily, but has stopped related to price.    Pain assessment: Cause of pain- Chronic back pain Pain location-Low back pain with sciatic in left leg Pain on scale of 1-10- 9 Frequency- Constant What increases pain-Standing and bending What makes pain Better-Rest and pain medication  Current medications- Norco  10-338m every 6 hours as needed  #90  Effectiveness of current meds-Continues to have pain  Pill count performed-No Urine drug screen- Yes, 01/05/16 [place]  If yes were their any concerning findings? - Has only received controlled medication from me.    Review of Systems  Constitutional: Positive for irritability.  HENT: Negative.  Negative for hoarse voice.   Eyes: Negative.  Negative for blurred vision.  Respiratory: Negative.   Cardiovascular: Negative.  Negative for palpitations.  Gastrointestinal: Negative.  Negative for bowel incontinence and diarrhea.  Endocrine: Negative.   Genitourinary: Negative.  Negative for bladder incontinence.  Musculoskeletal: Positive for back pain.  Neurological: Negative.  Negative for tingling.  Hematological: Negative.   Psychiatric/Behavioral: Positive for sleep disturbance. The patient is nervous/anxious and has insomnia.   All other systems reviewed and are negative.      Objective:   Physical Exam  Constitutional: She is oriented to person, place, and time. She appears well-developed and well-nourished. No distress.  HENT:  Head: Normocephalic and atraumatic.  Right Ear: External ear normal.  Left Ear: External ear normal.  Nose: Nose normal.  Mouth/Throat: Oropharynx is clear and moist.  Eyes: Pupils are equal, round, and reactive to light.  Neck: Normal range of motion. Neck supple. No thyromegaly present.  Cardiovascular: Normal rate, regular rhythm, normal heart sounds and intact distal pulses.   No murmur heard. Pulmonary/Chest: Effort normal and breath sounds normal. No respiratory distress. She has no wheezes.  Abdominal: Soft. Bowel sounds are normal. She exhibits no distension. There is no tenderness.  Musculoskeletal: She exhibits edema (trace amt in right leg and foot). She exhibits no tenderness.  Pt using walker, mild right sided weakness  Neurological: She is alert and oriented to person, place, and time. She has normal reflexes.  No cranial nerve deficit.  Skin: Skin is warm and dry.  Psychiatric: She has a normal mood and affect. Her behavior is normal. Judgment and thought content normal.  Vitals reviewed.     BP 133/71   Pulse 83   Temp 98.8 F (37.1 C) (Oral)   Ht 5' 5" (1.651 m)   Wt 194 lb (88 kg)   BMI 32.28 kg/m      Assessment & Plan:  1. Essential hypertension, benign - CMP14+EGFR  2. Hypothyroidism, unspecified type - CMP14+EGFR - Thyroid Panel With TSH  3. Overactive bladder -Pt started on Vesicare today since myrbetriq was too expensive  - CMP14+EGFR - solifenacin (VESICARE) 10 MG tablet; Take 1 tablet (10 mg total) by mouth daily.  Dispense: 90 tablet; Refill: 1  4. Chronic bilateral low back pain with left-sided sciatica -Pt started on mobic today -No other NSAID's - CMP14+EGFR - meloxicam (MOBIC) 7.5 MG tablet; Take 1 tablet (7.5 mg total) by mouth daily.  Dispense: 90 tablet; Refill: 1 - HYDROcodone-acetaminophen (NORCO) 10-325 MG tablet; Take 1 tablet by mouth every 8 (eight) hours as needed.  Dispense: 90 tablet; Refill: 0 - HYDROcodone-acetaminophen (NORCO) 10-325 MG tablet; Take 1 tablet by mouth every 6 (six) hours as needed.  Dispense: 90 tablet; Refill: 0 - HYDROcodone-acetaminophen (NORCO) 10-325 MG tablet; Take 1 tablet by mouth every 6 (six) hours as needed.  Dispense: 90 tablet; Refill: 0  5. Moderate episode of recurrent major depressive disorder (HCC) - CMP14+EGFR  6. GAD (generalized anxiety disorder) - CMP14+EGFR - ALPRAZolam (XANAX) 0.25 MG tablet; Take 1 tablet (0.25 mg total) by mouth at bedtime as needed for anxiety.  Dispense: 30 tablet;  Refill: 3  7. Hyperlipidemia, unspecified hyperlipidemia type - CMP14+EGFR  8. Vitamin D deficiency - CMP14+EGFR  9. Pain medication agreement signed - CMP14+EGFR - HYDROcodone-acetaminophen (NORCO) 10-325 MG tablet; Take 1 tablet by mouth every 8 (eight) hours as needed.  Dispense: 90 tablet; Refill: 0 -  HYDROcodone-acetaminophen (NORCO) 10-325 MG tablet; Take 1 tablet by mouth every 6 (six) hours as needed.  Dispense: 90 tablet; Refill: 0 - HYDROcodone-acetaminophen (NORCO) 10-325 MG tablet; Take 1 tablet by mouth every 6 (six) hours as needed.  Dispense: 90 tablet; Refill: 0  10. Uncomplicated opioid dependence (HCC) - CMP14+EGFR - HYDROcodone-acetaminophen (NORCO) 10-325 MG tablet; Take 1 tablet by mouth every 8 (eight) hours as needed.  Dispense: 90 tablet; Refill: 0 - HYDROcodone-acetaminophen (NORCO) 10-325 MG tablet; Take 1 tablet by mouth every 6 (six) hours as needed.  Dispense: 90 tablet; Refill: 0 - HYDROcodone-acetaminophen (NORCO) 10-325 MG tablet; Take 1 tablet by mouth every 6 (six) hours as needed.  Dispense: 90 tablet; Refill: 0  11. Obesity (BMI 30-39.9) - CMP14+EGFR  12. Late effect of cerebrovascular accident - CMP14+EGFR  13. Insomnia, unspecified type - CMP14+EGFR - ALPRAZolam (XANAX) 0.25 MG tablet; Take 1 tablet (0.25 mg total) by mouth at bedtime as needed for anxiety.  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  Dispense: 90 tablet; Refill: 1 - HYDROcodone-acetaminophen (NORCO) 10-325 MG tablet; Take 1 tablet by mouth every 8 (eight) hours as needed.  Dispense: 90 tablet; Refill: 0 - HYDROcodone-acetaminophen (NORCO) 10-325 MG tablet; Take 1 tablet by mouth every 6 (six) hours as needed.  Dispense: 90 tablet; Refill: 0 - HYDROcodone-acetaminophen (NORCO) 10-325 MG tablet; Take 1 tablet by mouth every 6 (six) hours as needed.  Dispense: 90 tablet; Refill: 0  15. Spinal stenosis of cervical region - CMP14+EGFR - HYDROcodone-acetaminophen (NORCO) 10-325 MG tablet; Take 1 tablet by mouth every 8 (eight) hours as needed.  Dispense: 90 tablet; Refill: 0  16. Pain management - CMP14+EGFR - HYDROcodone-acetaminophen (NORCO) 10-325 MG tablet; Take 1 tablet by  mouth every 8 (eight) hours as needed.  Dispense: 90 tablet; Refill: 0 - HYDROcodone-acetaminophen (NORCO) 10-325 MG tablet; Take 1 tablet by mouth every 6 (six) hours as needed.  Dispense: 90 tablet; Refill: 0 - HYDROcodone-acetaminophen (NORCO) 10-325 MG tablet; Take 1 tablet by mouth every 6 (six) hours as needed.  Dispense: 90 tablet; Refill: 0   Continue all meds Labs pending Health Maintenance reviewed Diet and exercise encouraged RTO 3 months   Evelina Dun, FNP

## 2016-12-30 NOTE — Patient Instructions (Signed)

## 2016-12-31 LAB — CMP14+EGFR
ALK PHOS: 84 IU/L (ref 39–117)
ALT: 12 IU/L (ref 0–32)
AST: 17 IU/L (ref 0–40)
Albumin/Globulin Ratio: 1.8 (ref 1.2–2.2)
Albumin: 4.5 g/dL (ref 3.5–4.8)
BILIRUBIN TOTAL: 0.3 mg/dL (ref 0.0–1.2)
BUN/Creatinine Ratio: 35 — ABNORMAL HIGH (ref 12–28)
BUN: 24 mg/dL (ref 8–27)
CHLORIDE: 94 mmol/L — AB (ref 96–106)
CO2: 24 mmol/L (ref 18–29)
CREATININE: 0.69 mg/dL (ref 0.57–1.00)
Calcium: 10.1 mg/dL (ref 8.7–10.3)
GFR calc Af Amer: 100 mL/min/{1.73_m2} (ref >59)
GFR calc non Af Amer: 87 mL/min/{1.73_m2} (ref >59)
GLUCOSE: 120 mg/dL — AB (ref 65–99)
Globulin, Total: 2.5 g/dL (ref 1.5–4.5)
Potassium: 4.6 mmol/L (ref 3.5–5.2)
Sodium: 136 mmol/L (ref 134–144)
Total Protein: 7 g/dL (ref 6.0–8.5)

## 2016-12-31 LAB — THYROID PANEL WITH TSH
FREE THYROXINE INDEX: 1.8 (ref 1.2–4.9)
T3 Uptake Ratio: 26 % (ref 24–39)
T4 TOTAL: 6.9 ug/dL (ref 4.5–12.0)
TSH: 0.696 u[IU]/mL (ref 0.450–4.500)

## 2017-01-26 ENCOUNTER — Other Ambulatory Visit: Payer: Self-pay | Admitting: Family

## 2017-01-26 DIAGNOSIS — I10 Essential (primary) hypertension: Secondary | ICD-10-CM

## 2017-01-28 ENCOUNTER — Other Ambulatory Visit: Payer: Self-pay | Admitting: Family

## 2017-01-28 DIAGNOSIS — F411 Generalized anxiety disorder: Secondary | ICD-10-CM

## 2017-01-28 DIAGNOSIS — F32A Depression, unspecified: Secondary | ICD-10-CM

## 2017-01-28 DIAGNOSIS — F329 Major depressive disorder, single episode, unspecified: Secondary | ICD-10-CM

## 2017-02-16 DIAGNOSIS — H353131 Nonexudative age-related macular degeneration, bilateral, early dry stage: Secondary | ICD-10-CM | POA: Diagnosis not present

## 2017-02-16 DIAGNOSIS — H33302 Unspecified retinal break, left eye: Secondary | ICD-10-CM | POA: Diagnosis not present

## 2017-02-16 DIAGNOSIS — H43813 Vitreous degeneration, bilateral: Secondary | ICD-10-CM | POA: Diagnosis not present

## 2017-02-16 DIAGNOSIS — H35372 Puckering of macula, left eye: Secondary | ICD-10-CM | POA: Diagnosis not present

## 2017-02-24 ENCOUNTER — Other Ambulatory Visit: Payer: Self-pay | Admitting: Family

## 2017-02-24 DIAGNOSIS — E785 Hyperlipidemia, unspecified: Secondary | ICD-10-CM

## 2017-04-07 ENCOUNTER — Ambulatory Visit: Payer: Medicare HMO | Admitting: Family

## 2017-04-08 ENCOUNTER — Encounter: Payer: Self-pay | Admitting: Family

## 2017-04-08 ENCOUNTER — Ambulatory Visit (INDEPENDENT_AMBULATORY_CARE_PROVIDER_SITE_OTHER): Payer: Medicare HMO | Admitting: Family

## 2017-04-08 VITALS — BP 113/63 | HR 74 | Temp 97.4°F | Ht 65.0 in | Wt 198.0 lb

## 2017-04-08 DIAGNOSIS — R69 Illness, unspecified: Secondary | ICD-10-CM | POA: Diagnosis not present

## 2017-04-08 DIAGNOSIS — E669 Obesity, unspecified: Secondary | ICD-10-CM | POA: Diagnosis not present

## 2017-04-08 DIAGNOSIS — F1120 Opioid dependence, uncomplicated: Secondary | ICD-10-CM

## 2017-04-08 DIAGNOSIS — M5442 Lumbago with sciatica, left side: Secondary | ICD-10-CM | POA: Diagnosis not present

## 2017-04-08 DIAGNOSIS — Z0289 Encounter for other administrative examinations: Secondary | ICD-10-CM | POA: Diagnosis not present

## 2017-04-08 DIAGNOSIS — E785 Hyperlipidemia, unspecified: Secondary | ICD-10-CM | POA: Diagnosis not present

## 2017-04-08 DIAGNOSIS — M4802 Spinal stenosis, cervical region: Secondary | ICD-10-CM

## 2017-04-08 DIAGNOSIS — F411 Generalized anxiety disorder: Secondary | ICD-10-CM

## 2017-04-08 DIAGNOSIS — G4700 Insomnia, unspecified: Secondary | ICD-10-CM | POA: Diagnosis not present

## 2017-04-08 DIAGNOSIS — F331 Major depressive disorder, recurrent, moderate: Secondary | ICD-10-CM

## 2017-04-08 DIAGNOSIS — R52 Pain, unspecified: Secondary | ICD-10-CM

## 2017-04-08 DIAGNOSIS — G890 Central pain syndrome: Secondary | ICD-10-CM

## 2017-04-08 DIAGNOSIS — E039 Hypothyroidism, unspecified: Secondary | ICD-10-CM | POA: Diagnosis not present

## 2017-04-08 DIAGNOSIS — E559 Vitamin D deficiency, unspecified: Secondary | ICD-10-CM | POA: Diagnosis not present

## 2017-04-08 DIAGNOSIS — I6930 Unspecified sequelae of cerebral infarction: Secondary | ICD-10-CM

## 2017-04-08 DIAGNOSIS — I10 Essential (primary) hypertension: Secondary | ICD-10-CM | POA: Diagnosis not present

## 2017-04-08 DIAGNOSIS — G8929 Other chronic pain: Secondary | ICD-10-CM

## 2017-04-08 DIAGNOSIS — N3281 Overactive bladder: Secondary | ICD-10-CM

## 2017-04-08 MED ORDER — LEVOTHYROXINE SODIUM 25 MCG PO TABS: ORAL_TABLET | ORAL | 3 refills | Status: DC

## 2017-04-08 MED ORDER — ALPRAZOLAM 0.25 MG PO TABS: 0.2500 mg | ORAL_TABLET | Freq: Every evening | ORAL | 3 refills | Status: DC | PRN

## 2017-04-08 MED ORDER — HYDROCODONE-ACETAMINOPHEN 10-325 MG PO TABS: 1.0000 | ORAL_TABLET | Freq: Four times a day (QID) | ORAL | 0 refills | Status: DC | PRN

## 2017-04-08 MED ORDER — HYDROCODONE-ACETAMINOPHEN 10-325 MG PO TABS: 1.0000 | ORAL_TABLET | Freq: Three times a day (TID) | ORAL | 0 refills | Status: DC | PRN

## 2017-04-08 MED ORDER — HYDROCODONE-ACETAMINOPHEN 10-325 MG PO TABS
1.0000 | ORAL_TABLET | Freq: Four times a day (QID) | ORAL | 0 refills | Status: DC | PRN
Start: 2017-04-08 — End: 2017-07-11

## 2017-04-08 NOTE — Patient Instructions (Signed)

## 2017-04-08 NOTE — Progress Notes (Signed)
Subjective:    Patient ID: Allison Parker, female    DOB: 14-Jul-1941, 76 y.o.   MRN: [id number]  Pt presents to the office today for chronic follow up and pain medication refill. 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 PT states this is stable at this time.  Hypertension  This is a chronic problem. The current episode started more than 1 year ago. The problem has been resolved since onset. The problem is controlled. Associated symptoms include anxiety and peripheral edema (right foot). Pertinent negatives include no shortness of breath. Risk factors for coronary artery disease include dyslipidemia, obesity, post-menopausal state and sedentary lifestyle. Hypertensive end-organ damage includes CVA. There is no history of kidney disease, CAD/MI or heart failure. Identifiable causes of hypertension include a thyroid problem.  Thyroid Problem  Presents for follow-up visit. Symptoms include anxiety and fatigue. Patient reports no constipation, diarrhea or nail problem. The symptoms have been stable. Her past medical history is significant for hyperlipidemia. There is no history of heart failure.  Depression         This is a chronic problem.  The current episode started more than 1 year ago.   The onset quality is gradual.   The problem occurs intermittently.  The problem has been waxing and waning since onset.  Associated symptoms include fatigue, helplessness, hopelessness, insomnia and sad.  Past medical history includes thyroid problem and anxiety.   Anxiety  Presents for follow-up visit. Symptoms include excessive worry, insomnia and nervous/anxious behavior. Patient reports no shortness of breath. Symptoms occur most days.    Hyperlipidemia  This is a chronic problem. The current episode started more than 1 year ago. The problem is controlled. Recent lipid tests were reviewed and are normal. Exacerbating diseases include obesity. Pertinent negatives include no  shortness of breath. Current antihyperlipidemic treatment includes statins. The current treatment provides moderate improvement of lipids.  Insomnia  Primary symptoms: difficulty falling asleep, frequent awakening.  The current episode started more than one year. The onset quality is gradual. The problem occurs intermittently. The problem has been waxing and waning since onset. PMH includes: depression.  Back Pain  This is a chronic problem. The current episode started more than 1 year ago. The problem occurs intermittently. The problem has been waxing and waning since onset. The pain is present in the lumbar spine. The quality of the pain is described as aching. The pain is at a severity of 8/10. The pain is mild. She has tried analgesics for the symptoms. The treatment provided moderate relief.  Overactive bladder PT currently taking vesicare 10 mg. Continues to wake up 3 times a night to pee.     Review of Systems  Constitutional: Positive for fatigue.  Respiratory: Negative for shortness of breath.   Gastrointestinal: Negative for constipation and diarrhea.  Musculoskeletal: Positive for back pain.  Psychiatric/Behavioral: Positive for depression. The patient is nervous/anxious and has insomnia.   All other systems reviewed and are negative.      Objective:   Physical Exam  Constitutional: She is oriented to person, place, and time. She appears well-developed and well-nourished. No distress.  HENT:  Head: Normocephalic and atraumatic.  Right Ear: External ear normal.  Left Ear: External ear normal.  Nose: Nose normal.  Mouth/Throat: Oropharynx is clear and moist.  Eyes: Pupils are equal, round, and reactive to light.  Neck: Normal range of motion. Neck supple. No thyromegaly present.  Cardiovascular: Normal rate, regular rhythm,  normal heart sounds and intact distal pulses.   No murmur heard. Pulmonary/Chest: Effort normal and breath sounds normal. No respiratory distress. She  has no wheezes.  Abdominal: Soft. Bowel sounds are normal. She exhibits no distension. There is no tenderness.  Musculoskeletal: Normal range of motion. She exhibits edema (trace in right LE). She exhibits no tenderness.  Neurological: She is alert and oriented to person, place, and time.  Skin: Skin is warm and dry.  Psychiatric: She has a normal mood and affect. Her behavior is normal. Judgment and thought content normal.  Vitals reviewed.     BP 113/63   Pulse 74   Temp 97.4 F (36.3 C) (Oral)   Ht '5\' 5"'$  (1.651 m)   Wt 198 lb (89.8 kg)   BMI 32.95 kg/m      Assessment & Plan:  1. Essential hypertension, benign - CMP14+EGFR  2. Hypothyroidism, unspecified type - CMP14+EGFR - Thyroid Panel With TSH - levothyroxine (SYNTHROID, LEVOTHROID) 25 MCG tablet; TAKE 1 TABLET BY MOUTH ONE TIME A DAY  Dispense: 90 tablet; Refill: 3  3. Vitamin D deficiency - CMP14+EGFR  4. Pain medication agreement signed  - HYDROcodone-acetaminophen (NORCO) 10-325 MG tablet; Take 1 tablet by mouth every 6 (six) hours as needed.  Dispense: 90 tablet; Refill: 0 - HYDROcodone-acetaminophen (NORCO) 10-325 MG tablet; Take 1 tablet by mouth every 8 (eight) hours as needed.  Dispense: 90 tablet; Refill: 0 - HYDROcodone-acetaminophen (NORCO) 10-325 MG tablet; Take 1 tablet by mouth every 6 (six) hours as needed.  Dispense: 90 tablet; Refill: 0 - CMP14+EGFR  5. Uncomplicated opioid dependence (HCC) - HYDROcodone-acetaminophen (NORCO) 10-325 MG tablet; Take 1 tablet by mouth every 6 (six) hours as needed.  Dispense: 90 tablet; Refill: 0 - HYDROcodone-acetaminophen (NORCO) 10-325 MG tablet; Take 1 tablet by mouth every 8 (eight) hours as needed.  Dispense: 90 tablet; Refill: 0 - HYDROcodone-acetaminophen (NORCO) 10-325 MG tablet; Take 1 tablet by mouth every 6 (six) hours as needed.  Dispense: 90 tablet; Refill: 0 - CMP14+EGFR  6. Obesity (BMI 30-39.9) - CMP14+EGFR  7. Late effect of cerebrovascular  accident  - CMP14+EGFR  8. Insomnia, unspecified type - CMP14+EGFR - ALPRAZolam (XANAX) 0.25 MG tablet; Take 1 tablet (0.25 mg total) by mouth at bedtime as needed for anxiety.  Dispense: 30 tablet; Refill: 3  9. Moderate episode of recurrent major depressive disorder (HCC) - CMP14+EGFR  10. GAD (generalized anxiety disorder)  - CMP14+EGFR - ALPRAZolam (XANAX) 0.25 MG tablet; Take 1 tablet (0.25 mg total) by mouth at bedtime as needed for anxiety.  Dispense: 30 tablet; Refill: 3  11. Hyperlipidemia, unspecified hyperlipidemia type - CMP14+EGFR - Lipid panel  12. Chronic bilateral low back pain with left-sided sciatica  - HYDROcodone-acetaminophen (NORCO) 10-325 MG tablet; Take 1 tablet by mouth every 6 (six) hours as needed.  Dispense: 90 tablet; Refill: 0 - HYDROcodone-acetaminophen (NORCO) 10-325 MG tablet; Take 1 tablet by mouth every 8 (eight) hours as needed.  Dispense: 90 tablet; Refill: 0 - HYDROcodone-acetaminophen (NORCO) 10-325 MG tablet; Take 1 tablet by mouth every 6 (six) hours as needed.  Dispense: 90 tablet; Refill: 0 - CMP14+EGFR  13. Pain management - HYDROcodone-acetaminophen (NORCO) 10-325 MG tablet; Take 1 tablet by mouth every 6 (six) hours as needed.  Dispense: 90 tablet; Refill: 0 - HYDROcodone-acetaminophen (NORCO) 10-325 MG tablet; Take 1 tablet by mouth every 8 (eight) hours as needed.  Dispense: 90 tablet; Refill: 0 - HYDROcodone-acetaminophen (NORCO) 10-325 MG tablet; Take 1 tablet by mouth every  6 (six) hours as needed.  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  Dispense: 90 tablet; Refill: 0 - HYDROcodone-acetaminophen (NORCO) 10-325 MG tablet; Take 1 tablet by mouth every 8 (eight) hours as needed.  Dispense: 90 tablet; Refill: 0 - HYDROcodone-acetaminophen (NORCO) 10-325 MG tablet; Take 1 tablet by mouth every 6  (six) hours as needed.  Dispense: 90 tablet; Refill: 0 - CMP14+EGFR  15. Spinal stenosis of cervical region  - HYDROcodone-acetaminophen (NORCO) 10-325 MG tablet; Take 1 tablet by mouth every 8 (eight) hours as needed.  Dispense: 90 tablet; Refill: 0 - CMP14+EGFR  16. Overactive bladder   Continue all meds Labs pending Health Maintenance reviewed Diet and exercise encouraged RTO 3 months   Evelina Dun, FNP

## 2017-04-09 LAB — CMP14+EGFR
A/G RATIO: 1.7 (ref 1.2–2.2)
ALBUMIN: 4.1 g/dL (ref 3.5–4.8)
ALT: 30 IU/L (ref 0–32)
AST: 23 IU/L (ref 0–40)
Alkaline Phosphatase: 114 IU/L (ref 39–117)
BILIRUBIN TOTAL: 0.4 mg/dL (ref 0.0–1.2)
BUN / CREAT RATIO: 27 (ref 12–28)
BUN: 21 mg/dL (ref 8–27)
CHLORIDE: 99 mmol/L (ref 96–106)
CO2: 25 mmol/L (ref 20–29)
Calcium: 9.4 mg/dL (ref 8.7–10.3)
Creatinine, Ser: 0.77 mg/dL (ref 0.57–1.00)
GFR calc non Af Amer: 77 mL/min/{1.73_m2} (ref >59)
GFR, EST AFRICAN AMERICAN: 89 mL/min/{1.73_m2} (ref >59)
GLOBULIN, TOTAL: 2.4 g/dL (ref 1.5–4.5)
Glucose: 91 mg/dL (ref 65–99)
POTASSIUM: 4.6 mmol/L (ref 3.5–5.2)
SODIUM: 139 mmol/L (ref 134–144)
TOTAL PROTEIN: 6.5 g/dL (ref 6.0–8.5)

## 2017-04-09 LAB — LIPID PANEL
Chol/HDL Ratio: 2.3 ratio (ref 0.0–4.4)
Cholesterol, Total: 166 mg/dL (ref 100–199)
HDL: 73 mg/dL (ref >39)
LDL Calculated: 71 mg/dL (ref 0–99)
Triglycerides: 108 mg/dL (ref 0–149)
VLDL Cholesterol Cal: 22 mg/dL (ref 5–40)

## 2017-04-09 LAB — THYROID PANEL WITH TSH
FREE THYROXINE INDEX: 2.1 (ref 1.2–4.9)
T3 Uptake Ratio: 27 % (ref 24–39)
T4 TOTAL: 7.9 ug/dL (ref 4.5–12.0)
TSH: 1.39 u[IU]/mL (ref 0.450–4.500)

## 2017-04-26 ENCOUNTER — Other Ambulatory Visit: Payer: Self-pay | Admitting: Family

## 2017-04-26 DIAGNOSIS — F329 Major depressive disorder, single episode, unspecified: Secondary | ICD-10-CM

## 2017-04-26 DIAGNOSIS — F411 Generalized anxiety disorder: Secondary | ICD-10-CM

## 2017-04-26 DIAGNOSIS — F32A Depression, unspecified: Secondary | ICD-10-CM

## 2017-05-25 ENCOUNTER — Other Ambulatory Visit: Payer: Self-pay | Admitting: Family

## 2017-05-25 DIAGNOSIS — E785 Hyperlipidemia, unspecified: Secondary | ICD-10-CM

## 2017-06-21 ENCOUNTER — Other Ambulatory Visit: Payer: Self-pay | Admitting: Family

## 2017-06-21 DIAGNOSIS — F411 Generalized anxiety disorder: Secondary | ICD-10-CM

## 2017-06-21 DIAGNOSIS — F32A Depression, unspecified: Secondary | ICD-10-CM

## 2017-06-21 DIAGNOSIS — F329 Major depressive disorder, single episode, unspecified: Secondary | ICD-10-CM

## 2017-06-21 DIAGNOSIS — I10 Essential (primary) hypertension: Secondary | ICD-10-CM

## 2017-06-21 DIAGNOSIS — G8929 Other chronic pain: Secondary | ICD-10-CM

## 2017-06-21 DIAGNOSIS — G890 Central pain syndrome: Secondary | ICD-10-CM

## 2017-06-21 DIAGNOSIS — M5442 Lumbago with sciatica, left side: Secondary | ICD-10-CM

## 2017-07-11 ENCOUNTER — Ambulatory Visit (INDEPENDENT_AMBULATORY_CARE_PROVIDER_SITE_OTHER): Payer: Medicare HMO | Admitting: Family

## 2017-07-11 ENCOUNTER — Encounter: Payer: Self-pay | Admitting: Family

## 2017-07-11 VITALS — BP 138/69 | HR 80 | Temp 98.1°F | Ht 65.0 in | Wt 197.6 lb

## 2017-07-11 DIAGNOSIS — G4700 Insomnia, unspecified: Secondary | ICD-10-CM | POA: Diagnosis not present

## 2017-07-11 DIAGNOSIS — R52 Pain, unspecified: Secondary | ICD-10-CM

## 2017-07-11 DIAGNOSIS — G890 Central pain syndrome: Secondary | ICD-10-CM | POA: Diagnosis not present

## 2017-07-11 DIAGNOSIS — F411 Generalized anxiety disorder: Secondary | ICD-10-CM

## 2017-07-11 DIAGNOSIS — R69 Illness, unspecified: Secondary | ICD-10-CM | POA: Diagnosis not present

## 2017-07-11 DIAGNOSIS — I10 Essential (primary) hypertension: Secondary | ICD-10-CM

## 2017-07-11 DIAGNOSIS — Z0289 Encounter for other administrative examinations: Secondary | ICD-10-CM | POA: Diagnosis not present

## 2017-07-11 DIAGNOSIS — F1120 Opioid dependence, uncomplicated: Secondary | ICD-10-CM | POA: Diagnosis not present

## 2017-07-11 DIAGNOSIS — I6930 Unspecified sequelae of cerebral infarction: Secondary | ICD-10-CM

## 2017-07-11 DIAGNOSIS — E669 Obesity, unspecified: Secondary | ICD-10-CM | POA: Diagnosis not present

## 2017-07-11 DIAGNOSIS — G8929 Other chronic pain: Secondary | ICD-10-CM

## 2017-07-11 DIAGNOSIS — F331 Major depressive disorder, recurrent, moderate: Secondary | ICD-10-CM

## 2017-07-11 DIAGNOSIS — E039 Hypothyroidism, unspecified: Secondary | ICD-10-CM | POA: Diagnosis not present

## 2017-07-11 DIAGNOSIS — E785 Hyperlipidemia, unspecified: Secondary | ICD-10-CM | POA: Diagnosis not present

## 2017-07-11 DIAGNOSIS — M5442 Lumbago with sciatica, left side: Secondary | ICD-10-CM | POA: Diagnosis not present

## 2017-07-11 DIAGNOSIS — M4802 Spinal stenosis, cervical region: Secondary | ICD-10-CM

## 2017-07-11 DIAGNOSIS — N3281 Overactive bladder: Secondary | ICD-10-CM

## 2017-07-11 MED ORDER — ALPRAZOLAM 0.25 MG PO TABS: 0.2500 mg | ORAL_TABLET | Freq: Every evening | ORAL | 3 refills | Status: DC | PRN

## 2017-07-11 MED ORDER — HYDROCODONE-ACETAMINOPHEN 10-325 MG PO TABS: 1.0000 | ORAL_TABLET | Freq: Three times a day (TID) | ORAL | 0 refills | Status: DC | PRN

## 2017-07-11 MED ORDER — HYDROCODONE-ACETAMINOPHEN 10-325 MG PO TABS: 1.0000 | ORAL_TABLET | Freq: Four times a day (QID) | ORAL | 0 refills | Status: DC | PRN

## 2017-07-11 MED ORDER — CITALOPRAM HYDROBROMIDE 40 MG PO TABS: 40.0000 mg | ORAL_TABLET | Freq: Every day | ORAL | 5 refills | Status: DC

## 2017-07-11 MED ORDER — MIRABEGRON ER 50 MG PO TB24: 50.0000 mg | ORAL_TABLET | Freq: Every day | ORAL | 3 refills | Status: DC

## 2017-07-11 NOTE — Progress Notes (Signed)
Subjective:    Patient ID: Allison Parker, female    DOB: 1941/08/14, 76 y.o.   MRN: [id number]  Pt presents to the office today for chronic follow up and pain medication refill for chronic back pain and thalamic pain syndrome. 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 PT states this is stable at this time.  Hypertension  This is a chronic problem. The current episode started more than 1 year ago. The problem has been resolved since onset. The problem is controlled. Associated symptoms include anxiety, malaise/fatigue and peripheral edema (right leg). Pertinent negatives include no shortness of breath. Risk factors for coronary artery disease include dyslipidemia, post-menopausal state and sedentary lifestyle. The current treatment provides moderate improvement. Hypertensive end-organ damage includes CVA. There is no history of kidney disease, CAD/MI or heart failure.  Back Pain  This is a chronic problem. The current episode started more than 1 year ago. The pain is present in the lumbar spine. The quality of the pain is described as aching. The pain is at a severity of 8/10. The pain is moderate. Associated symptoms include leg pain. Pertinent negatives include no bladder incontinence or bowel incontinence. Risk factors include obesity. She has tried analgesics and bed rest for the symptoms. The treatment provided moderate relief.  Hyperlipidemia  This is a chronic problem. The current episode started more than 1 year ago. The problem is controlled. Recent lipid tests were reviewed and are normal. Associated symptoms include leg pain. Pertinent negatives include no shortness of breath. Current antihyperlipidemic treatment includes statins. The current treatment provides moderate improvement of lipids. Risk factors for coronary artery disease include dyslipidemia, obesity, post-menopausal and a sedentary lifestyle.  Anxiety  Presents for follow-up visit. Symptoms include  depressed mood, excessive worry, insomnia, irritability, nervous/anxious behavior and restlessness. Patient reports no shortness of breath. Symptoms occur most days.    Depression         This is a chronic problem.  The current episode started more than 1 year ago.   The onset quality is gradual.   The problem occurs daily.  The problem has been waxing and waning since onset.  Associated symptoms include insomnia, irritable, restlessness and sad.  Associated symptoms include no helplessness and no hopelessness.  Past treatments include SSRIs - Selective serotonin reuptake inhibitors.  Compliance with treatment is good.  Past medical history includes anxiety.   Insomnia  Primary symptoms: difficulty falling asleep, malaise/fatigue.  The current episode started more than one year. The problem occurs every several days. PMH includes: depression.  Overactive Bladder PT currently taking vesicare 10 mg, but states the price is very elevated.     Review of Systems  Constitutional: Positive for irritability and malaise/fatigue.  Respiratory: Negative for shortness of breath.   Gastrointestinal: Negative for bowel incontinence.  Genitourinary: Negative for bladder incontinence.  Musculoskeletal: Positive for back pain.  Psychiatric/Behavioral: Positive for depression. The patient is nervous/anxious and has insomnia.        Objective:   Physical Exam  Constitutional: She is oriented to person, place, and time. She appears well-developed and well-nourished. She is irritable. No distress.  HENT:  Head: Normocephalic and atraumatic.  Right Ear: External ear normal.  Left Ear: External ear normal.  Nose: Nose normal.  Mouth/Throat: Oropharynx is clear and moist.  Eyes: Pupils are equal, round, and reactive to light.  Neck: Normal range of motion. Neck supple. No thyromegaly present.  Cardiovascular: Normal rate, regular rhythm,  normal heart sounds and intact distal pulses.   No murmur  heard. Pulmonary/Chest: Effort normal and breath sounds normal. No respiratory distress. She has no wheezes.  Abdominal: Soft. Bowel sounds are normal. She exhibits no distension. There is no tenderness.  Musculoskeletal: She exhibits edema (trace right LE). She exhibits no tenderness.  Mild Right sided weakness  Neurological: She is alert and oriented to person, place, and time.  Skin: Skin is warm and dry.  Psychiatric: She has a normal mood and affect. Her behavior is normal. Judgment and thought content normal.  Vitals reviewed.     BP 138/69   Pulse 80   Temp 98.1 F (36.7 C) (Oral)   Ht 5' 5" (1.651 m)   Wt 197 lb 9.6 oz (89.6 kg)   BMI 32.88 kg/m      Assessment & Plan:  1. Essential hypertension, benign - CMP14+EGFR  2. Hypothyroidism, unspecified type - CMP14+EGFR - TSH  3. Hyperlipidemia, unspecified hyperlipidemia type - CMP14+EGFR - Lipid panel  4. Obesity (BMI 30-39.9) - CMP14+EGFR  5. Chronic bilateral low back pain with left-sided sciatica - CMP14+EGFR - HYDROcodone-acetaminophen (NORCO) 10-325 MG tablet; Take 1 tablet by mouth every 6 (six) hours as needed.  Dispense: 90 tablet; Refill: 0 - HYDROcodone-acetaminophen (NORCO) 10-325 MG tablet; Take 1 tablet by mouth every 8 (eight) hours as needed.  Dispense: 90 tablet; Refill: 0 - HYDROcodone-acetaminophen (NORCO) 10-325 MG tablet; Take 1 tablet by mouth every 6 (six) hours as needed.  Dispense: 90 tablet; Refill: 0  6. GAD (generalized anxiety disorder) - CMP14+EGFR - ALPRAZolam (XANAX) 0.25 MG tablet; Take 1 tablet (0.25 mg total) by mouth at bedtime as needed for anxiety.  Dispense: 30 tablet; Refill: 3 - citalopram (CELEXA) 40 MG tablet; Take 1 tablet (40 mg total) by mouth daily.  Dispense: 30 tablet; Refill: 5  7. Moderate episode of recurrent major depressive disorder (HCC) Celexa increased to 40 mg from 20 mg  Stress management discussed - CMP14+EGFR - citalopram (CELEXA) 40 MG tablet; Take  1 tablet (40 mg total) by mouth daily.  Dispense: 30 tablet; Refill: 5  8. Late effect of cerebrovascular accident - CMP14+EGFR  9. Insomnia, unspecified type - CMP14+EGFR - ALPRAZolam (XANAX) 0.25 MG tablet; Take 1 tablet (0.25 mg total) by mouth at bedtime as needed for anxiety.  Dispense: 30 tablet; Refill: 3  10. Pain medication agreement signed - CMP14+EGFR - HYDROcodone-acetaminophen (NORCO) 10-325 MG tablet; Take 1 tablet by mouth every 6 (six) hours as needed.  Dispense: 90 tablet; Refill: 0 - HYDROcodone-acetaminophen (NORCO) 10-325 MG tablet; Take 1 tablet by mouth every 8 (eight) hours as needed.  Dispense: 90 tablet; Refill: 0 - HYDROcodone-acetaminophen (NORCO) 10-325 MG tablet; Take 1 tablet by mouth every 6 (six) hours as needed.  Dispense: 90 tablet; Refill: 0  11. Uncomplicated opioid dependence (HCC) - CMP14+EGFR - HYDROcodone-acetaminophen (NORCO) 10-325 MG tablet; Take 1 tablet by mouth every 6 (six) hours as needed.  Dispense: 90 tablet; Refill: 0 - HYDROcodone-acetaminophen (NORCO) 10-325 MG tablet; Take 1 tablet by mouth every 8 (eight) hours as needed.  Dispense: 90 tablet; Refill: 0 - HYDROcodone-acetaminophen (NORCO) 10-325 MG tablet; Take 1 tablet by mouth every 6 (six) hours as needed.  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  Dispense: 90 tablet; Refill: 0 - HYDROcodone-acetaminophen (NORCO) 10-325 MG tablet; Take 1 tablet by mouth every  8 (eight) hours as needed.  Dispense: 90 tablet; Refill: 0 - HYDROcodone-acetaminophen (NORCO) 10-325 MG tablet; Take 1 tablet by mouth every 6 (six) hours as needed.  Dispense: 90 tablet; Refill: 0  13. Overactive bladder -Myrbetriq 50 mg ordered and Vesicare stopped today related to  Price. Samples given  - CMP14+EGFR - mirabegron ER (MYRBETRIQ) 50 MG TB24 tablet; Take 1 tablet (50 mg  total) by mouth daily.  Dispense: 30 tablet; Refill: 3  14. Pain management  CMP14+EGFR - HYDROcodone-acetaminophen (NORCO) 10-325 MG tablet; Take 1 tablet by mouth every 6 (six) hours as needed.  Dispense: 90 tablet; Refill: 0 - HYDROcodone-acetaminophen (NORCO) 10-325 MG tablet; Take 1 tablet by mouth every 8 (eight) hours as needed.  Dispense: 90 tablet; Refill: 0 - HYDROcodone-acetaminophen (NORCO) 10-325 MG tablet; Take 1 tablet by mouth every 6 (six) hours as needed.  Dispense: 90 tablet; Refill: 0  15. Spinal stenosis of cervical region - CMP14+EGFR - HYDROcodone-acetaminophen (NORCO) 10-325 MG tablet; Take 1 tablet by mouth every 8 (eight) hours as needed.  Dispense: 90 tablet; Refill: 0  Pt reviewed in [place] controlled database- Only received controlled medications from me.    Continue all meds Labs pending Health Maintenance reviewed Diet and exercise encouraged RTO 3 months   Evelina Dun, FNP

## 2017-07-11 NOTE — Patient Instructions (Signed)

## 2017-07-12 LAB — CMP14+EGFR
ALK PHOS: 102 IU/L (ref 39–117)
ALT: 18 IU/L (ref 0–32)
AST: 19 IU/L (ref 0–40)
Albumin/Globulin Ratio: 1.9 (ref 1.2–2.2)
Albumin: 4.3 g/dL (ref 3.5–4.8)
BILIRUBIN TOTAL: 0.4 mg/dL (ref 0.0–1.2)
BUN / CREAT RATIO: 23 (ref 12–28)
BUN: 19 mg/dL (ref 8–27)
CHLORIDE: 100 mmol/L (ref 96–106)
CO2: 27 mmol/L (ref 20–29)
CREATININE: 0.83 mg/dL (ref 0.57–1.00)
Calcium: 9.4 mg/dL (ref 8.7–10.3)
GFR calc Af Amer: 81 mL/min/{1.73_m2} (ref >59)
GFR calc non Af Amer: 70 mL/min/{1.73_m2} (ref >59)
Globulin, Total: 2.3 g/dL (ref 1.5–4.5)
Glucose: 92 mg/dL (ref 65–99)
Potassium: 4.3 mmol/L (ref 3.5–5.2)
Sodium: 140 mmol/L (ref 134–144)
Total Protein: 6.6 g/dL (ref 6.0–8.5)

## 2017-07-12 LAB — LIPID PANEL
Chol/HDL Ratio: 2.2 ratio (ref 0.0–4.4)
Cholesterol, Total: 177 mg/dL (ref 100–199)
HDL: 79 mg/dL (ref >39)
LDL Calculated: 77 mg/dL (ref 0–99)
TRIGLYCERIDES: 106 mg/dL (ref 0–149)
VLDL CHOLESTEROL CAL: 21 mg/dL (ref 5–40)

## 2017-07-12 LAB — TSH: TSH: 2.21 u[IU]/mL (ref 0.450–4.500)

## 2017-08-16 DIAGNOSIS — H353131 Nonexudative age-related macular degeneration, bilateral, early dry stage: Secondary | ICD-10-CM | POA: Diagnosis not present

## 2017-08-16 DIAGNOSIS — H35372 Puckering of macula, left eye: Secondary | ICD-10-CM | POA: Diagnosis not present

## 2017-08-16 DIAGNOSIS — H43813 Vitreous degeneration, bilateral: Secondary | ICD-10-CM | POA: Diagnosis not present

## 2017-09-06 DIAGNOSIS — M1711 Unilateral primary osteoarthritis, right knee: Secondary | ICD-10-CM | POA: Diagnosis not present

## 2017-09-09 ENCOUNTER — Other Ambulatory Visit: Payer: Self-pay | Admitting: Family

## 2017-09-09 DIAGNOSIS — E785 Hyperlipidemia, unspecified: Secondary | ICD-10-CM

## 2017-09-20 ENCOUNTER — Other Ambulatory Visit: Payer: Self-pay | Admitting: Family

## 2017-09-20 DIAGNOSIS — G8929 Other chronic pain: Secondary | ICD-10-CM

## 2017-09-20 DIAGNOSIS — M5442 Lumbago with sciatica, left side: Principal | ICD-10-CM

## 2017-09-20 DIAGNOSIS — G890 Central pain syndrome: Secondary | ICD-10-CM

## 2017-10-11 ENCOUNTER — Ambulatory Visit (INDEPENDENT_AMBULATORY_CARE_PROVIDER_SITE_OTHER): Payer: Medicare HMO | Admitting: Family

## 2017-10-11 ENCOUNTER — Encounter: Payer: Self-pay | Admitting: Family

## 2017-10-11 VITALS — BP 116/73 | HR 74 | Temp 98.6°F | Ht 65.0 in | Wt 196.2 lb

## 2017-10-11 DIAGNOSIS — R69 Illness, unspecified: Secondary | ICD-10-CM | POA: Diagnosis not present

## 2017-10-11 DIAGNOSIS — F411 Generalized anxiety disorder: Secondary | ICD-10-CM

## 2017-10-11 DIAGNOSIS — E785 Hyperlipidemia, unspecified: Secondary | ICD-10-CM

## 2017-10-11 DIAGNOSIS — I10 Essential (primary) hypertension: Secondary | ICD-10-CM

## 2017-10-11 DIAGNOSIS — E039 Hypothyroidism, unspecified: Secondary | ICD-10-CM

## 2017-10-11 DIAGNOSIS — M4802 Spinal stenosis, cervical region: Secondary | ICD-10-CM | POA: Diagnosis not present

## 2017-10-11 DIAGNOSIS — G890 Central pain syndrome: Secondary | ICD-10-CM | POA: Diagnosis not present

## 2017-10-11 DIAGNOSIS — E669 Obesity, unspecified: Secondary | ICD-10-CM

## 2017-10-11 DIAGNOSIS — F1120 Opioid dependence, uncomplicated: Secondary | ICD-10-CM

## 2017-10-11 DIAGNOSIS — Z0289 Encounter for other administrative examinations: Secondary | ICD-10-CM

## 2017-10-11 DIAGNOSIS — M5442 Lumbago with sciatica, left side: Secondary | ICD-10-CM | POA: Diagnosis not present

## 2017-10-11 DIAGNOSIS — R52 Pain, unspecified: Secondary | ICD-10-CM | POA: Diagnosis not present

## 2017-10-11 DIAGNOSIS — Z23 Encounter for immunization: Secondary | ICD-10-CM

## 2017-10-11 DIAGNOSIS — I6930 Unspecified sequelae of cerebral infarction: Secondary | ICD-10-CM | POA: Diagnosis not present

## 2017-10-11 DIAGNOSIS — G8929 Other chronic pain: Secondary | ICD-10-CM

## 2017-10-11 MED ORDER — HYDROCODONE-ACETAMINOPHEN 10-325 MG PO TABS: 1.0000 | ORAL_TABLET | Freq: Four times a day (QID) | ORAL | 0 refills | Status: DC | PRN

## 2017-10-11 MED ORDER — HYDROCODONE-ACETAMINOPHEN 10-325 MG PO TABS: 1.0000 | ORAL_TABLET | Freq: Three times a day (TID) | ORAL | 0 refills | Status: DC | PRN

## 2017-10-11 NOTE — Progress Notes (Signed)
Subjective:    Patient ID: Allison Parker, female    DOB: 29-Aug-1941, 76 y.o.   MRN: [id number]  Pt presents to the office today for chronic follow up and pain medication refill for chronic back pain and thalamic pain syndrome. 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 PT states this is stable at this time.  Hypertension  This is a chronic problem. The current episode started more than 1 year ago. The problem has been resolved since onset. The problem is controlled. Associated symptoms include anxiety and peripheral edema (right). Pertinent negatives include no headaches, malaise/fatigue or shortness of breath. Risk factors for coronary artery disease include dyslipidemia and post-menopausal state. The current treatment provides moderate improvement. Hypertensive end-organ damage includes CVA. There is no history of CAD/MI or heart failure. Identifiable causes of hypertension include a thyroid problem.  Hyperlipidemia  This is a chronic problem. The current episode started more than 1 year ago. The problem is controlled. Recent lipid tests were reviewed and are normal. Exacerbating diseases include obesity. Pertinent negatives include no shortness of breath. Current antihyperlipidemic treatment includes statins. The current treatment provides moderate improvement of lipids. Risk factors for coronary artery disease include dyslipidemia, hypertension, post-menopausal and a sedentary lifestyle.  Thyroid Problem  Presents for follow-up visit. Symptoms include anxiety and dry skin. Patient reports no constipation, fatigue or hoarse voice. The symptoms have been stable. Her past medical history is significant for hyperlipidemia. There is no history of heart failure.  Back Pain  This is a chronic problem. The current episode started more than 1 year ago. The problem occurs intermittently. The problem has been waxing and waning since onset. The pain is at a severity of 7/10. The  pain is moderate. Pertinent negatives include no headaches.  Anxiety  Presents for follow-up visit. Symptoms include excessive worry, irritability and nervous/anxious behavior. Patient reports no shortness of breath. Symptoms occur occasionally.        Review of Systems  Constitutional: Positive for irritability. Negative for fatigue and malaise/fatigue.  HENT: Negative for hoarse voice.   Respiratory: Negative for shortness of breath.   Gastrointestinal: Negative for constipation.  Musculoskeletal: Positive for back pain.  Neurological: Negative for headaches.  Psychiatric/Behavioral: The patient is nervous/anxious.   All other systems reviewed and are negative.      Objective:   Physical Exam  Constitutional: She is oriented to person, place, and time. She appears well-developed and well-nourished. No distress.  HENT:  Head: Normocephalic and atraumatic.  Right Ear: External ear normal.  Left Ear: External ear normal.  Nose: Nose normal.  Mouth/Throat: Oropharynx is clear and moist.  Eyes: Pupils are equal, round, and reactive to light.  Neck: Normal range of motion. Neck supple. No thyromegaly present.  Cardiovascular: Normal rate, regular rhythm, normal heart sounds and intact distal pulses.  No murmur heard. Pulmonary/Chest: Effort normal and breath sounds normal. No respiratory distress. She has no wheezes.  Abdominal: Soft. Bowel sounds are normal. She exhibits no distension. There is no tenderness.  Musculoskeletal: She exhibits no edema or tenderness.  Right sided weakness, pain in lower back with flexion and extension  Neurological: She is alert and oriented to person, place, and time.  Skin: Skin is warm and dry.  Psychiatric: She has a normal mood and affect. Her behavior is normal. Judgment and thought content normal.  Vitals reviewed.     BP 116/73   Pulse 74   Temp 98.6 F (37  C) (Oral)   Ht 5\' 5"  (1.651 m)   Wt 196 lb 3.2 oz (89 kg)   BMI 32.65  kg/m      Assessment & Plan:  1. Essential hypertension, benign  2. Thalamic pain syndrome - HYDROcodone-acetaminophen (NORCO) 10-325 MG tablet; Take 1 tablet by mouth every 6 (six) hours as needed.  Dispense: 90 tablet; Refill: 0 - HYDROcodone-acetaminophen (NORCO) 10-325 MG tablet; Take 1 tablet by mouth every 8 (eight) hours as needed.  Dispense: 90 tablet; Refill: 0 - HYDROcodone-acetaminophen (NORCO) 10-325 MG tablet; Take 1 tablet by mouth every 6 (six) hours as needed.  Dispense: 90 tablet; Refill: 0  3. Hyperlipidemia, unspecified hyperlipidemia type  4. Hypothyroidism, unspecified type  5. Late effect of cerebrovascular accident  6. GAD (generalized anxiety disorder)   7. Chronic bilateral low back pain with left-sided sciatica - HYDROcodone-acetaminophen (NORCO) 10-325 MG tablet; Take 1 tablet by mouth every 6 (six) hours as needed.  Dispense: 90 tablet; Refill: 0 - HYDROcodone-acetaminophen (NORCO) 10-325 MG tablet; Take 1 tablet by mouth every 8 (eight) hours as needed.  Dispense: 90 tablet; Refill: 0 - HYDROcodone-acetaminophen (NORCO) 10-325 MG tablet; Take 1 tablet by mouth every 6 (six) hours as needed.  Dispense: 90 tablet; Refill: 0  8. Obesity (BMI 30-39.9)  9. Uncomplicated opioid dependence (HCC)  - HYDROcodone-acetaminophen (NORCO) 10-325 MG tablet; Take 1 tablet by mouth every 6 (six) hours as needed.  Dispense: 90 tablet; Refill: 0 - HYDROcodone-acetaminophen (NORCO) 10-325 MG tablet; Take 1 tablet by mouth every 8 (eight) hours as needed.  Dispense: 90 tablet; Refill: 0 - HYDROcodone-acetaminophen (NORCO) 10-325 MG tablet; Take 1 tablet by mouth every 6 (six) hours as needed.  Dispense: 90 tablet; Refill: 0  10. Pain medication agreement signed - HYDROcodone-acetaminophen (NORCO) 10-325 MG tablet; Take 1 tablet by mouth every 6 (six) hours as needed.  Dispense: 90 tablet; Refill: 0 - HYDROcodone-acetaminophen (NORCO) 10-325 MG tablet; Take 1 tablet by  mouth every 8 (eight) hours as needed.  Dispense: 90 tablet; Refill: 0 - HYDROcodone-acetaminophen (NORCO) 10-325 MG tablet; Take 1 tablet by mouth every 6 (six) hours as needed.  Dispense: 90 tablet; Refill: 0  11. Pain management - HYDROcodone-acetaminophen (NORCO) 10-325 MG tablet; Take 1 tablet by mouth every 6 (six) hours as needed.  Dispense: 90 tablet; Refill: 0 - HYDROcodone-acetaminophen (NORCO) 10-325 MG tablet; Take 1 tablet by mouth every 8 (eight) hours as needed.  Dispense: 90 tablet; Refill: 0 - HYDROcodone-acetaminophen (NORCO) 10-325 MG tablet; Take 1 tablet by mouth every 6 (six) hours as needed.  Dispense: 90 tablet; Refill: 0  12. Spinal stenosis of cervical region - HYDROcodone-acetaminophen (NORCO) 10-325 MG tablet; Take 1 tablet by mouth every 8 (eight) hours as needed.  Dispense: 90 tablet; Refill: 0  PT reviewed in [place] controlled database. Pt has only received controlled medications from me.   Continue all meds Will hold off on labs today and do on next visit Health Maintenance reviewed Diet and exercise encouraged RTO 3 months   Evelina Dun, FNP

## 2017-10-11 NOTE — Patient Instructions (Signed)

## 2017-11-01 ENCOUNTER — Other Ambulatory Visit: Payer: Self-pay | Admitting: Family

## 2017-11-01 DIAGNOSIS — I10 Essential (primary) hypertension: Secondary | ICD-10-CM

## 2017-12-09 ENCOUNTER — Other Ambulatory Visit: Payer: Self-pay | Admitting: Family

## 2017-12-09 DIAGNOSIS — E785 Hyperlipidemia, unspecified: Secondary | ICD-10-CM

## 2017-12-21 ENCOUNTER — Other Ambulatory Visit: Payer: Self-pay | Admitting: Family

## 2017-12-21 DIAGNOSIS — G890 Central pain syndrome: Secondary | ICD-10-CM

## 2017-12-21 DIAGNOSIS — M5442 Lumbago with sciatica, left side: Principal | ICD-10-CM

## 2017-12-21 DIAGNOSIS — G8929 Other chronic pain: Secondary | ICD-10-CM

## 2018-01-09 ENCOUNTER — Ambulatory Visit (INDEPENDENT_AMBULATORY_CARE_PROVIDER_SITE_OTHER): Payer: Medicare HMO | Admitting: Family

## 2018-01-09 ENCOUNTER — Encounter: Payer: Self-pay | Admitting: Family

## 2018-01-09 VITALS — BP 117/69 | HR 98 | Temp 98.4°F | Ht 65.0 in | Wt 195.2 lb

## 2018-01-09 DIAGNOSIS — F1120 Opioid dependence, uncomplicated: Secondary | ICD-10-CM | POA: Diagnosis not present

## 2018-01-09 DIAGNOSIS — G8929 Other chronic pain: Secondary | ICD-10-CM

## 2018-01-09 DIAGNOSIS — E039 Hypothyroidism, unspecified: Secondary | ICD-10-CM

## 2018-01-09 DIAGNOSIS — F411 Generalized anxiety disorder: Secondary | ICD-10-CM | POA: Diagnosis not present

## 2018-01-09 DIAGNOSIS — N3281 Overactive bladder: Secondary | ICD-10-CM

## 2018-01-09 DIAGNOSIS — E559 Vitamin D deficiency, unspecified: Secondary | ICD-10-CM

## 2018-01-09 DIAGNOSIS — I10 Essential (primary) hypertension: Secondary | ICD-10-CM

## 2018-01-09 DIAGNOSIS — M5442 Lumbago with sciatica, left side: Secondary | ICD-10-CM

## 2018-01-09 DIAGNOSIS — R69 Illness, unspecified: Secondary | ICD-10-CM | POA: Diagnosis not present

## 2018-01-09 DIAGNOSIS — Z0289 Encounter for other administrative examinations: Secondary | ICD-10-CM

## 2018-01-09 DIAGNOSIS — R52 Pain, unspecified: Secondary | ICD-10-CM

## 2018-01-09 DIAGNOSIS — E669 Obesity, unspecified: Secondary | ICD-10-CM

## 2018-01-09 DIAGNOSIS — G4700 Insomnia, unspecified: Secondary | ICD-10-CM

## 2018-01-09 DIAGNOSIS — M4802 Spinal stenosis, cervical region: Secondary | ICD-10-CM

## 2018-01-09 DIAGNOSIS — E785 Hyperlipidemia, unspecified: Secondary | ICD-10-CM | POA: Diagnosis not present

## 2018-01-09 DIAGNOSIS — I6930 Unspecified sequelae of cerebral infarction: Secondary | ICD-10-CM

## 2018-01-09 DIAGNOSIS — F331 Major depressive disorder, recurrent, moderate: Secondary | ICD-10-CM

## 2018-01-09 DIAGNOSIS — G890 Central pain syndrome: Secondary | ICD-10-CM

## 2018-01-09 MED ORDER — HYDROCODONE-ACETAMINOPHEN 10-325 MG PO TABS: 1.0000 | ORAL_TABLET | Freq: Four times a day (QID) | ORAL | 0 refills | Status: DC | PRN

## 2018-01-09 MED ORDER — HYDROCODONE-ACETAMINOPHEN 10-325 MG PO TABS: 1.0000 | ORAL_TABLET | Freq: Three times a day (TID) | ORAL | 0 refills | Status: DC | PRN

## 2018-01-09 MED ORDER — HYDROCODONE-ACETAMINOPHEN 10-325 MG PO TABS
1.0000 | ORAL_TABLET | Freq: Four times a day (QID) | ORAL | 0 refills | Status: DC | PRN
Start: 2018-01-09 — End: 2018-04-14

## 2018-01-09 NOTE — Patient Instructions (Signed)

## 2018-01-09 NOTE — Progress Notes (Signed)
Subjective:    Patient ID: Allison Parker, female    DOB: [birth year], 77 y.o.   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 PT states this is stable at this time.  Hypertension  This is a chronic problem. The current episode started more than 1 year ago. The problem has been resolved since onset. The problem is controlled. Associated symptoms include anxiety, malaise/fatigue and peripheral edema ("a little"). Pertinent negatives include no shortness of breath. Risk factors for coronary artery disease include dyslipidemia and obesity. The current treatment provides moderate improvement. Hypertensive end-organ damage includes CVA. There is no history of kidney disease, CAD/MI or heart failure. Identifiable causes of hypertension include a thyroid problem.  Thyroid Problem  Presents for follow-up visit. Symptoms include anxiety and fatigue. Patient reports no constipation, depressed mood, diarrhea or dry skin. The symptoms have been stable. Her past medical history is significant for hyperlipidemia. There is no history of heart failure.  Hyperlipidemia  This is a chronic problem. The current episode started more than 1 year ago. The problem is controlled. Recent lipid tests were reviewed and are normal. Exacerbating diseases include obesity. Pertinent negatives include no shortness of breath. Current antihyperlipidemic treatment includes statins. The current treatment provides moderate improvement of lipids.  Insomnia  Primary symptoms: difficulty falling asleep, frequent awakening, malaise/fatigue.  The current episode started more than one year. The onset quality is gradual. The problem occurs intermittently. The problem has been waxing and waning since onset. Past treatments include medication. The treatment provided mild relief.  PMH includes: hypertension, depression, chronic pain.  Anxiety  Presents for follow-up visit. Symptoms include excessive worry, insomnia, irritability, nervous/anxious behavior and restlessness. Patient reports no depressed mood or shortness of breath. Symptoms occur most days. The severity of symptoms is mild. The quality of sleep is good.    Depression         This is a chronic problem.  The current episode started more than 1 year ago.   The onset quality is gradual.   The problem occurs intermittently.  The problem has been waxing and waning since onset.  Associated symptoms include fatigue, insomnia, restlessness and sad.  Past treatments include SSRIs - Selective serotonin reuptake inhibitors.  Past medical history includes thyroid problem and anxiety.   Urinary Frequency   This is a chronic problem. The current episode started more than 1 year ago. The problem occurs intermittently. The problem has been gradually worsening. The patient is experiencing no pain. Associated symptoms include frequency. Treatments tried: taking myrbetriq. The treatment provided moderate relief.  Back Pain  This is a chronic problem. The current episode started more than 1 year ago. The problem occurs intermittently. The problem has been waxing and waning since onset. The pain is present in the lumbar spine. The pain is moderate.      Review of Systems  Constitutional: Positive for fatigue, irritability and malaise/fatigue.  Respiratory: Negative for shortness of breath.   Gastrointestinal: Negative for constipation and diarrhea.  Genitourinary: Positive for frequency.  Musculoskeletal: Positive for back pain.  Psychiatric/Behavioral: Positive for depression. The patient is nervous/anxious and has insomnia.   All other systems reviewed and are negative.      Objective:   Physical Exam  Constitutional: She is oriented to person, place, and time. She appears well-developed  and well-nourished. No distress.    HENT:  Head: Normocephalic and atraumatic.  Right Ear: External ear normal.  Left Ear: External ear normal.  Nose: Nose normal.  Mouth/Throat: Oropharynx is clear and moist.  Eyes: Pupils are equal, round, and reactive to light.  Neck: Normal range of motion. Neck supple. No thyromegaly present.  Cardiovascular: Normal rate, regular rhythm, normal heart sounds and intact distal pulses.  No murmur heard. Pulmonary/Chest: Effort normal and breath sounds normal. No respiratory distress. She has no wheezes.  Abdominal: Soft. Bowel sounds are normal. She exhibits no distension. There is no tenderness.  Musculoskeletal: She exhibits edema (trace in BLE). She exhibits no tenderness.  Generalized weakness, using cane   Neurological: She is alert and oriented to person, place, and time.  Skin: Skin is warm and dry.  Psychiatric: She has a normal mood and affect. Her behavior is normal. Judgment and thought content normal.  Vitals reviewed.     BP 117/69   Pulse 98   Temp 98.4 F (36.9 C) (Oral)   Ht _0  (1.651 m)   Wt 195 lb 3.2 oz (88.5 kg)   BMI 32.48 kg/m      Assessment & Plan:  1. Hyperlipidemia, unspecified hyperlipidemia type - CMP14+EGFR - Lipid panel  2. Essential hypertension, benign - CMP14+EGFR  3. Overactive bladder - CMP14+EGFR  4. Obesity (BMI 30-39.9) - CMP14+EGFR  5. Uncomplicated opioid dependence (HCC)  - CMP14+EGFR - HYDROcodone-acetaminophen (NORCO) 10-325 MG tablet; Take 1 tablet by mouth every 6 (six) hours as needed.  Dispense: 90 tablet; Refill: 0 - HYDROcodone-acetaminophen (NORCO) 10-325 MG tablet; Take 1 tablet by mouth every 8 (eight) hours as needed.  Dispense: 90 tablet; Refill: 0 - HYDROcodone-acetaminophen (NORCO) 10-325 MG tablet; Take 1 tablet by mouth every 6 (six) hours as needed.  Dispense: 90 tablet; Refill: 0  6. Pain medication agreement signed - CMP14+EGFR - HYDROcodone-acetaminophen (NORCO) 10-325 MG tablet; Take 1 tablet  by mouth every 6 (six) hours as needed.  Dispense: 90 tablet; Refill: 0 - HYDROcodone-acetaminophen (NORCO) 10-325 MG tablet; Take 1 tablet by mouth every 8 (eight) hours as needed.  Dispense: 90 tablet; Refill: 0 - HYDROcodone-acetaminophen (NORCO) 10-325 MG tablet; Take 1 tablet by mouth every 6 (six) hours as needed.  Dispense: 90 tablet; Refill: 0  7. Chronic bilateral low back pain with left-sided sciatica - CMP14+EGFR - HYDROcodone-acetaminophen (NORCO) 10-325 MG tablet; Take 1 tablet by mouth every 6 (six) hours as needed.  Dispense: 90 tablet; Refill: 0 - HYDROcodone-acetaminophen (NORCO) 10-325 MG tablet; Take 1 tablet by mouth every 8 (eight) hours as needed.  Dispense: 90 tablet; Refill: 0 - HYDROcodone-acetaminophen (NORCO) 10-325 MG tablet; Take 1 tablet by mouth every 6 (six) hours as needed.  Dispense: 90 tablet; Refill: 0  8. Insomnia, unspecified type  - CMP14+EGFR  9. GAD (generalized anxiety disorder) - CMP14+EGFR  10. Vitamin D deficiency  - CMP14+EGFR  11. Hypothyroidism, unspecified type - CMP14+EGFR - TSH  12. Moderate episode of recurrent major depressive disorder (HCC)  - CMP14+EGFR  13. 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  Dispense: 90 tablet; Refill: 0 - HYDROcodone-acetaminophen (NORCO) 10-325 MG tablet; Take 1 tablet by mouth every 8 (eight) hours as needed.  Dispense: 90 tablet; Refill: 0 - HYDROcodone-acetaminophen (NORCO) 10-325 MG tablet; Take 1 tablet by mouth every 6 (six) hours  as needed.  Dispense: 90 tablet; Refill: 0  15. Pain management  - CMP14+EGFR - HYDROcodone-acetaminophen (NORCO) 10-325 MG tablet; Take 1 tablet by mouth every 6 (six) hours as needed.  Dispense: 90 tablet; Refill: 0 - HYDROcodone-acetaminophen (NORCO) 10-325 MG tablet; Take 1 tablet by mouth every 8  (eight) hours as needed.  Dispense: 90 tablet; Refill: 0 - HYDROcodone-acetaminophen (NORCO) 10-325 MG tablet; Take 1 tablet by mouth every 6 (six) hours as needed.  Dispense: 90 tablet; Refill: 0  16. Spinal stenosis of cervical region - CMP14+EGFR - HYDROcodone-acetaminophen (NORCO) 10-325 MG tablet; Take 1 tablet by mouth every 8 (eight) hours as needed.  Dispense: 90 tablet; Refill: 0   Continue all meds Labs pending Health Maintenance reviewed Diet and exercise encouraged RTO 3 months   Evelina Dun, FNP

## 2018-01-10 LAB — TSH: TSH: 1.53 u[IU]/mL (ref 0.450–4.500)

## 2018-01-10 LAB — CMP14+EGFR
ALK PHOS: 129 IU/L — AB (ref 39–117)
ALT: 39 IU/L — AB (ref 0–32)
AST: 30 IU/L (ref 0–40)
Albumin/Globulin Ratio: 2.1 (ref 1.2–2.2)
Albumin: 4.4 g/dL (ref 3.5–4.8)
BILIRUBIN TOTAL: 0.3 mg/dL (ref 0.0–1.2)
BUN/Creatinine Ratio: 20 (ref 12–28)
BUN: 16 mg/dL (ref 8–27)
CHLORIDE: 99 mmol/L (ref 96–106)
CO2: 25 mmol/L (ref 20–29)
Calcium: 9.5 mg/dL (ref 8.7–10.3)
Creatinine, Ser: 0.8 mg/dL (ref 0.57–1.00)
GFR calc non Af Amer: 73 mL/min/{1.73_m2} (ref >59)
GFR, EST AFRICAN AMERICAN: 84 mL/min/{1.73_m2} (ref >59)
GLUCOSE: 100 mg/dL — AB (ref 65–99)
Globulin, Total: 2.1 g/dL (ref 1.5–4.5)
Potassium: 4.3 mmol/L (ref 3.5–5.2)
Sodium: 141 mmol/L (ref 134–144)
TOTAL PROTEIN: 6.5 g/dL (ref 6.0–8.5)

## 2018-01-10 LAB — LIPID PANEL
CHOLESTEROL TOTAL: 153 mg/dL (ref 100–199)
Chol/HDL Ratio: 2 ratio (ref 0.0–4.4)
HDL: 75 mg/dL (ref >39)
LDL Calculated: 61 mg/dL (ref 0–99)
TRIGLYCERIDES: 87 mg/dL (ref 0–149)
VLDL CHOLESTEROL CAL: 17 mg/dL (ref 5–40)

## 2018-01-27 ENCOUNTER — Other Ambulatory Visit: Payer: Self-pay | Admitting: Family

## 2018-01-27 DIAGNOSIS — F411 Generalized anxiety disorder: Secondary | ICD-10-CM

## 2018-01-27 DIAGNOSIS — F331 Major depressive disorder, recurrent, moderate: Secondary | ICD-10-CM

## 2018-02-14 DIAGNOSIS — H353131 Nonexudative age-related macular degeneration, bilateral, early dry stage: Secondary | ICD-10-CM | POA: Diagnosis not present

## 2018-02-14 DIAGNOSIS — H43813 Vitreous degeneration, bilateral: Secondary | ICD-10-CM | POA: Diagnosis not present

## 2018-02-14 DIAGNOSIS — H33302 Unspecified retinal break, left eye: Secondary | ICD-10-CM | POA: Diagnosis not present

## 2018-03-10 ENCOUNTER — Other Ambulatory Visit: Payer: Self-pay | Admitting: Family

## 2018-03-10 DIAGNOSIS — E785 Hyperlipidemia, unspecified: Secondary | ICD-10-CM

## 2018-03-27 ENCOUNTER — Other Ambulatory Visit: Payer: Self-pay | Admitting: Family

## 2018-03-27 DIAGNOSIS — G8929 Other chronic pain: Secondary | ICD-10-CM

## 2018-03-27 DIAGNOSIS — G890 Central pain syndrome: Secondary | ICD-10-CM

## 2018-03-27 DIAGNOSIS — M5442 Lumbago with sciatica, left side: Principal | ICD-10-CM

## 2018-04-09 ENCOUNTER — Other Ambulatory Visit: Payer: Self-pay | Admitting: Family

## 2018-04-09 DIAGNOSIS — E039 Hypothyroidism, unspecified: Secondary | ICD-10-CM

## 2018-04-14 ENCOUNTER — Ambulatory Visit (INDEPENDENT_AMBULATORY_CARE_PROVIDER_SITE_OTHER): Payer: Medicare HMO | Admitting: Family

## 2018-04-14 ENCOUNTER — Encounter: Payer: Self-pay | Admitting: Family

## 2018-04-14 VITALS — BP 128/80 | HR 70 | Temp 98.6°F | Ht 65.0 in | Wt 196.0 lb

## 2018-04-14 DIAGNOSIS — F1120 Opioid dependence, uncomplicated: Secondary | ICD-10-CM

## 2018-04-14 DIAGNOSIS — E039 Hypothyroidism, unspecified: Secondary | ICD-10-CM | POA: Diagnosis not present

## 2018-04-14 DIAGNOSIS — I6930 Unspecified sequelae of cerebral infarction: Secondary | ICD-10-CM

## 2018-04-14 DIAGNOSIS — R52 Pain, unspecified: Secondary | ICD-10-CM | POA: Diagnosis not present

## 2018-04-14 DIAGNOSIS — Z0289 Encounter for other administrative examinations: Secondary | ICD-10-CM | POA: Diagnosis not present

## 2018-04-14 DIAGNOSIS — M4802 Spinal stenosis, cervical region: Secondary | ICD-10-CM | POA: Diagnosis not present

## 2018-04-14 DIAGNOSIS — G890 Central pain syndrome: Secondary | ICD-10-CM

## 2018-04-14 DIAGNOSIS — E669 Obesity, unspecified: Secondary | ICD-10-CM | POA: Diagnosis not present

## 2018-04-14 DIAGNOSIS — G8929 Other chronic pain: Secondary | ICD-10-CM

## 2018-04-14 DIAGNOSIS — E785 Hyperlipidemia, unspecified: Secondary | ICD-10-CM | POA: Diagnosis not present

## 2018-04-14 DIAGNOSIS — M5442 Lumbago with sciatica, left side: Secondary | ICD-10-CM

## 2018-04-14 DIAGNOSIS — R079 Chest pain, unspecified: Secondary | ICD-10-CM

## 2018-04-14 DIAGNOSIS — I4510 Unspecified right bundle-branch block: Secondary | ICD-10-CM

## 2018-04-14 DIAGNOSIS — R69 Illness, unspecified: Secondary | ICD-10-CM | POA: Diagnosis not present

## 2018-04-14 DIAGNOSIS — I10 Essential (primary) hypertension: Secondary | ICD-10-CM

## 2018-04-14 MED ORDER — HYDROCODONE-ACETAMINOPHEN 10-325 MG PO TABS: 1.0000 | ORAL_TABLET | Freq: Four times a day (QID) | ORAL | 0 refills | Status: DC | PRN

## 2018-04-14 MED ORDER — HYDROCODONE-ACETAMINOPHEN 10-325 MG PO TABS
1.0000 | ORAL_TABLET | Freq: Four times a day (QID) | ORAL | 0 refills | Status: DC | PRN
Start: 2018-04-14 — End: 2018-07-11

## 2018-04-14 MED ORDER — HYDROCODONE-ACETAMINOPHEN 10-325 MG PO TABS
1.0000 | ORAL_TABLET | Freq: Three times a day (TID) | ORAL | 0 refills | Status: DC | PRN
Start: 2018-04-14 — End: 2018-05-05

## 2018-04-14 NOTE — Progress Notes (Signed)
Subjective:    Patient ID: Allison Parker, female    DOB: [birth year], 77 y.o.   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 PT states this is stable at this time. Hypertension  This is a chronic problem. The current episode started more than 1 year ago. The problem has been resolved since onset. The problem is controlled. Associated symptoms include chest pain, malaise/fatigue, peripheral edema and shortness of breath ("a little bit"). Risk factors for coronary artery disease include dyslipidemia, obesity and sedentary lifestyle. The current treatment provides moderate improvement. Hypertensive end-organ damage includes CVA. There is no history of CAD/MI. Identifiable causes of hypertension include a thyroid problem.  Thyroid Problem  Presents for follow-up visit. Symptoms include fatigue. Patient reports no diarrhea, dry skin or leg swelling. The symptoms have been stable.  Back Pain  This is a chronic problem. The current episode started more than 1 year ago. The problem occurs constantly. The problem has been waxing and waning since onset. The pain is present in the lumbar spine. The quality of the pain is described as aching. The pain is at a severity of 8/10. The pain is moderate. The symptoms are aggravated by coughing and standing. Associated symptoms include chest pain, leg pain, numbness, tingling and weakness. Pertinent negatives include no bladder incontinence or bowel incontinence. Risk factors include history of steroid use. She has tried analgesics for the symptoms. The treatment provided moderate relief.  Chest Pain   This is a new problem. The current episode started yesterday. The onset quality is sudden. The problem occurs rarely. The problem has been resolved. The pain is present in the substernal region. The  pain is mild. The pain radiates to the left shoulder. Associated symptoms include back pain, leg pain, malaise/fatigue, numbness, shortness of breath ("a little bit") and weakness.  Her past medical history is significant for hypertension and thyroid problem.      Review of Systems  Constitutional: Positive for fatigue and malaise/fatigue.  Respiratory: Positive for shortness of breath ("a little bit").   Cardiovascular: Positive for chest pain.  Gastrointestinal: Negative for bowel incontinence and diarrhea.  Genitourinary: Negative for bladder incontinence.  Musculoskeletal: Positive for back pain.  Neurological: Positive for tingling, weakness and numbness.  All other systems reviewed and are negative.      Objective:   Physical Exam  Constitutional: She is oriented to person, place, and time. She appears well-developed and well-nourished. No distress.  HENT:  Head: Normocephalic and atraumatic.  Right Ear: External ear normal.  Left Ear: External ear normal.  Mouth/Throat: Oropharynx is clear and moist.  Eyes: Pupils are equal, round, and reactive to light.  Neck: Normal range of motion. Neck supple. No thyromegaly present.  Cardiovascular: Normal rate, regular rhythm, normal heart sounds and intact distal pulses.  No murmur heard. Pulmonary/Chest: Effort normal and breath sounds normal. No respiratory distress. She has no wheezes.  Abdominal: Soft. Bowel sounds are normal. She exhibits no distension. There is no tenderness.  Musculoskeletal: She exhibits no edema or tenderness.  Late effect of CVA with right sided weakness, pt using cane with generalized weakness  Neurological: She is alert and oriented to person, place, and time. She has normal reflexes. No cranial nerve deficit.  Skin: Skin is warm and dry.  Psychiatric: She has a normal mood  and affect. Her behavior is normal. Judgment and thought content normal.  Vitals reviewed.   BP 128/80   Pulse 70   Temp 98.6  F (37 C) (Oral)   Ht 5' 5"  (1.651 m)   Wt 196 lb (88.9 kg)   BMI 32.62 kg/m      Assessment & Plan:  Allison Parker comes in today with chief complaint of pain management (refills) and Medical Management of Chronic Issues   Diagnosis and orders addressed:  1. Essential hypertension, benign - CMP14+EGFR  2. Late effect of cerebrovascular accident - CMP14+EGFR  3. Hypothyroidism, unspecified type - CMP14+EGFR  4. Hyperlipidemia, unspecified hyperlipidemia type - CMP14+EGFR  5. Chronic bilateral low back pain with left-sided sciatica - CMP14+EGFR - HYDROcodone-acetaminophen (NORCO) 10-325 MG tablet; Take 1 tablet by mouth every 6 (six) hours as needed.  Dispense: 90 tablet; Refill: 0 - HYDROcodone-acetaminophen (NORCO) 10-325 MG tablet; Take 1 tablet by mouth every 8 (eight) hours as needed.  Dispense: 90 tablet; Refill: 0 - HYDROcodone-acetaminophen (NORCO) 10-325 MG tablet; Take 1 tablet by mouth every 6 (six) hours as needed.  Dispense: 90 tablet; Refill: 0  6. Thalamic pain syndrome - CMP14+EGFR - HYDROcodone-acetaminophen (NORCO) 10-325 MG tablet; Take 1 tablet by mouth every 6 (six) hours as needed.  Dispense: 90 tablet; Refill: 0 - HYDROcodone-acetaminophen (NORCO) 10-325 MG tablet; Take 1 tablet by mouth every 8 (eight) hours as needed.  Dispense: 90 tablet; Refill: 0 - HYDROcodone-acetaminophen (NORCO) 10-325 MG tablet; Take 1 tablet by mouth every 6 (six) hours as needed.  Dispense: 90 tablet; Refill: 0  7. Pain medication agreement signed - CMP14+EGFR - HYDROcodone-acetaminophen (NORCO) 10-325 MG tablet; Take 1 tablet by mouth every 6 (six) hours as needed.  Dispense: 90 tablet; Refill: 0 - HYDROcodone-acetaminophen (NORCO) 10-325 MG tablet; Take 1 tablet by mouth every 8 (eight) hours as needed.  Dispense: 90 tablet; Refill: 0 - HYDROcodone-acetaminophen (NORCO) 10-325 MG tablet; Take 1 tablet by mouth every 6 (six) hours as needed.  Dispense: 90 tablet; Refill:  0  8. Uncomplicated opioid dependence (HCC) - CMP14+EGFR - HYDROcodone-acetaminophen (NORCO) 10-325 MG tablet; Take 1 tablet by mouth every 6 (six) hours as needed.  Dispense: 90 tablet; Refill: 0 - HYDROcodone-acetaminophen (NORCO) 10-325 MG tablet; Take 1 tablet by mouth every 8 (eight) hours as needed.  Dispense: 90 tablet; Refill: 0 - HYDROcodone-acetaminophen (NORCO) 10-325 MG tablet; Take 1 tablet by mouth every 6 (six) hours as needed.  Dispense: 90 tablet; Refill: 0  9. Obesity (BMI 30-39.9) - CMP14+EGFR  10. Chest pain, unspecified type - EKG 12-Lead - CMP14+EGFR - Ambulatory referral to Cardiology  11. Pain management - CMP14+EGFR - HYDROcodone-acetaminophen (NORCO) 10-325 MG tablet; Take 1 tablet by mouth every 6 (six) hours as needed.  Dispense: 90 tablet; Refill: 0 - HYDROcodone-acetaminophen (NORCO) 10-325 MG tablet; Take 1 tablet by mouth every 8 (eight) hours as needed.  Dispense: 90 tablet; Refill: 0 - HYDROcodone-acetaminophen (NORCO) 10-325 MG tablet; Take 1 tablet by mouth every 6 (six) hours as needed.  Dispense: 90 tablet; Refill: 0  12. Spinal stenosis of cervical region - CMP14+EGFR - HYDROcodone-acetaminophen (NORCO) 10-325 MG tablet; Take 1 tablet by mouth every 8 (eight) hours as needed.  Dispense: 90 tablet; Refill: 0  13. Right bundle branch block - Ambulatory referral to Cardiology   Labs pending Health Maintenance reviewed Diet and exercise encouraged  Follow up plan: 3 months    Evelina Dun, FNP

## 2018-04-14 NOTE — Patient Instructions (Signed)
Nonspecific Chest Pain °Chest pain can be caused by many different conditions. There is always a chance that your pain could be related to something serious, such as a heart attack or a blood clot in your lungs. Chest pain can also be caused by conditions that are not life-threatening. If you have chest pain, it is very important to follow up with your health care provider. °What are the causes? °Causes of this condition include: °· Heartburn. °· Pneumonia or bronchitis. °· Anxiety or stress. °· Inflammation around your heart (pericarditis) or lung (pleuritis or pleurisy). °· A blood clot in your lung. °· A collapsed lung (pneumothorax). This can develop suddenly on its own (spontaneous pneumothorax) or from trauma to the chest. °· Shingles infection (varicella-zoster virus). °· Heart attack. °· Damage to the bones, muscles, and cartilage that make up your chest wall. This can include: °? Bruised bones due to injury. °? Strained muscles or cartilage due to frequent or repeated coughing or overwork. °? Fracture to one or more ribs. °? Sore cartilage due to inflammation (costochondritis). ° °What increases the risk? °Risk factors for this condition may include: °· Activities that increase your risk for trauma or injury to your chest. °· Respiratory infections or conditions that cause frequent coughing. °· Medical conditions or overeating that can cause heartburn. °· Heart disease or family history of heart disease. °· Conditions or health behaviors that increase your risk of developing a blood clot. °· Having had chicken pox (varicella zoster). ° °What are the signs or symptoms? °Chest pain can feel like: °· Burning or tingling on the surface of your chest or deep in your chest. °· Crushing, pressure, aching, or squeezing pain. °· Dull or sharp pain that is worse when you move, cough, or take a deep breath. °· Pain that is also felt in your back, neck, shoulder, or arm, or pain that spreads to any of these  areas. ° °Your chest pain may come and go, or it may stay constant. °How is this diagnosed? °Lab tests or other studies may be needed to find the cause of your pain. Your health care provider may have you take a test called an ECG (electrocardiogram). An ECG records your heartbeat patterns at the time the test is performed. You may also have other tests, such as: °· Transthoracic echocardiogram (TTE). In this test, sound waves are used to create a picture of the heart structures and to look at how blood flows through your heart. °· Transesophageal echocardiogram (TEE). This is a more advanced imaging test that takes images from inside your body. It allows your health care provider to see your heart in finer detail. °· Cardiac monitoring. This allows your health care provider to monitor your heart rate and rhythm in real time. °· Holter monitor. This is a portable device that records your heartbeat and can help to diagnose abnormal heartbeats. It allows your health care provider to track your heart activity for several days, if needed. °· Stress tests. These can be done through exercise or by taking medicine that makes your heart beat more quickly. °· Blood tests. °· Other imaging tests. ° °How is this treated? °Treatment depends on what is causing your chest pain. Treatment may include: °· Medicines. These may include: °? Acid blockers for heartburn. °? Anti-inflammatory medicine. °? Pain medicine for inflammatory conditions. °? Antibiotic medicine, if an infection is present. °? Medicines to dissolve blood clots. °? Medicines to treat coronary artery disease (CAD). °· Supportive care for conditions that   do not require medicines. This may include: °? Resting. °? Applying heat or cold packs to injured areas. °? Limiting activities until pain decreases. ° °Follow these instructions at home: °Medicines °· If you were prescribed an antibiotic, take it as told by your health care provider. Do not stop taking the  antibiotic even if you start to feel better. °· Take over-the-counter and prescription medicines only as told by your health care provider. °Lifestyle °· Do not use any products that contain nicotine or tobacco, such as cigarettes and e-cigarettes. If you need help quitting, ask your health care provider. °· Do not drink alcohol. °· Make lifestyle changes as directed by your health care provider. These may include: °? Getting regular exercise. Ask your health care provider to suggest some activities that are safe for you. °? Eating a heart-healthy diet. A registered dietitian can help you to learn healthy eating options. °? Maintaining a healthy weight. °? Managing diabetes, if necessary. °? Reducing stress, such as with yoga or relaxation techniques. °General instructions °· Avoid any activities that bring on chest pain. °· If heartburn is the cause for your chest pain, raise (elevate) the head of your bed about 6 inches (15 cm) by putting blocks under the legs. Sleeping with more pillows does not effectively relieve heartburn because it only changes the position of your head. °· Keep all follow-up visits as told by your health care provider. This is important. This includes any further testing if your chest pain does not go away. °Contact a health care provider if: °· Your chest pain does not go away. °· You have a rash with blisters on your chest. °· You have a fever. °· You have chills. °Get help right away if: °· Your chest pain is worse. °· You have a cough that gets worse, or you cough up blood. °· You have severe pain in your abdomen. °· You have severe weakness. °· You faint. °· You have sudden, unexplained chest discomfort. °· You have sudden, unexplained discomfort in your arms, back, neck, or jaw. °· You have shortness of breath at any time. °· You suddenly start to sweat, or your skin gets clammy. °· You feel nauseous or you vomit. °· You suddenly feel light-headed or dizzy. °· Your heart begins to beat  quickly, or it feels like it is skipping beats. °These symptoms may represent a serious problem that is an emergency. Do not wait to see if the symptoms will go away. Get medical help right away. Call your local emergency services (911 in the [place] Do not drive yourself to the hospital. °This information is not intended to replace advice given to you by your health care provider. Make sure you discuss any questions you have with your health care provider. °Document Released: 07/21/2005 Document Revised: 07/05/2016 Document Reviewed: 07/05/2016 °[employer] Interactive Patient Education © 2017 [employer] ° °

## 2018-04-15 LAB — CMP14+EGFR
ALBUMIN: 4.4 g/dL (ref 3.5–4.8)
ALT: 62 IU/L — ABNORMAL HIGH (ref 0–32)
AST: 53 IU/L — ABNORMAL HIGH (ref 0–40)
Albumin/Globulin Ratio: 1.8 (ref 1.2–2.2)
Alkaline Phosphatase: 139 IU/L — ABNORMAL HIGH (ref 39–117)
BUN / CREAT RATIO: 23 (ref 12–28)
BUN: 19 mg/dL (ref 8–27)
Bilirubin Total: 0.4 mg/dL (ref 0.0–1.2)
CO2: 26 mmol/L (ref 20–29)
Calcium: 9.5 mg/dL (ref 8.7–10.3)
Chloride: 101 mmol/L (ref 96–106)
Creatinine, Ser: 0.82 mg/dL (ref 0.57–1.00)
GFR calc non Af Amer: 71 mL/min/{1.73_m2} (ref >59)
GFR, EST AFRICAN AMERICAN: 82 mL/min/{1.73_m2} (ref >59)
GLOBULIN, TOTAL: 2.4 g/dL (ref 1.5–4.5)
GLUCOSE: 92 mg/dL (ref 65–99)
Potassium: 4.7 mmol/L (ref 3.5–5.2)
Sodium: 143 mmol/L (ref 134–144)
TOTAL PROTEIN: 6.8 g/dL (ref 6.0–8.5)

## 2018-04-19 DIAGNOSIS — H0100B Unspecified blepharitis left eye, upper and lower eyelids: Secondary | ICD-10-CM | POA: Diagnosis not present

## 2018-04-19 DIAGNOSIS — H353131 Nonexudative age-related macular degeneration, bilateral, early dry stage: Secondary | ICD-10-CM | POA: Diagnosis not present

## 2018-04-19 DIAGNOSIS — H0100A Unspecified blepharitis right eye, upper and lower eyelids: Secondary | ICD-10-CM | POA: Diagnosis not present

## 2018-04-19 DIAGNOSIS — H52203 Unspecified astigmatism, bilateral: Secondary | ICD-10-CM | POA: Diagnosis not present

## 2018-05-04 ENCOUNTER — Other Ambulatory Visit: Payer: Self-pay | Admitting: Family

## 2018-05-04 DIAGNOSIS — F411 Generalized anxiety disorder: Secondary | ICD-10-CM

## 2018-05-04 DIAGNOSIS — F331 Major depressive disorder, recurrent, moderate: Secondary | ICD-10-CM

## 2018-05-04 DIAGNOSIS — I10 Essential (primary) hypertension: Secondary | ICD-10-CM

## 2018-05-04 NOTE — Progress Notes (Signed)
Cardiology Office Note:    Date:  05/05/2018   ID:  [name], DOB 1941/01/19, MRN [id number]  PCP:  Sharion Balloon, FNP  Cardiologist:  Buford Dresser, MD PhD Referring MD: Sharion Balloon, FNP   Chief Complaint  Patient presents with  . Shortness of Breath  . Chest Pain    History of Present Illness:    Allison Parker is a 77 y.o. female with a hx of hypertension, hypothyroidism, hyperlipidemia, obesity and CVA who is seen as a new consult at the request of Evelina Dun for evaluation of chest pain and RBBB.  Patient concerns: Brother has a Orthoptist" MI last year, so now she is concerned. Had an episode of chest pain about a month ago, had chest heaviness while lying in bed at 4 AM. Central chest, mildly short of breath, also had left arm numbness. A little nausea, no diaphoresis. Got up and chewed aspiring, went away in 5 minutes. Never had anything that severe before or since. Never had the arm numbness before. Has mild chest pain randomly for the last year. Dull pain, not associated with exertion, sometimes moves to her back. Occurs maybe every other week. No nausea, sob, or diaphoresis associated with these. Don't last long, goes away on its own. Occasionally makes her stop what she is doing.   Does have a history of stroke, 21 years ago. Was an clot-based embolic stroke. Did not find atrial fibrillation per patient, no clear source. 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  Never had heart workup before. Saw Dr. Percival Spanish before for heart flutter, declined stress test. Her personal risk factors: lipids well controlled (LDL 61) on atorvastatin. Blood pressure well controlled on amlodipine. On aspirin 325 mg since the stroke, doesn't take every day. Discussed dropping to 81 mg. Never smoker. Rare alcohol. Activity limited due to knee arthritis, does walk some but does her own housework/yardwork without limitations. Can climb stairs slowly without issue,  doesn't cause chest pain.  FH: brother with "widowmaker" MI last year. No other history of heart attack or stroke.  Past Medical History:  Diagnosis Date  . Depression   . History of YAG laser capsulotomy of lens of left eye   . Hyperlipidemia   . Hypertension   . Insomnia   . Stroke ([place])   . Thyroid disease    hypothyroid    Past Surgical History:  Procedure Laterality Date  . BREAST SURGERY     lumpectomy  . CATARACT EXTRACTION    . CHOLECYSTECTOMY    . Torn retina Left   . 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    Current Medications: Current Outpatient Medications on File Prior to Visit  Medication Sig  . amLODipine (NORVASC) 5 MG tablet TAKE 1 TABLET BY MOUTH EVERY DAY  . atorvastatin (LIPITOR) 20 MG tablet TAKE 1 TABLET BY MOUTH EVERY DAY  . Calcium Carbonate-Vitamin D (SM CALCIUM 500/VITAMIN D3 PO) Take by mouth.  . Cholecalciferol (VITAMIN D-1000 MAX ST PO) Take 0.5 tablets by mouth daily.  . citalopram (CELEXA) 40 MG tablet TAKE 1 TABLET BY MOUTH EVERY DAY  . Coenzyme Q10 (CO Q-10) 300 MG CAPS Take 1 capsule by mouth daily.  Marland Kitchen HYDROcodone-acetaminophen (NORCO) 10-325 MG tablet Take 1 tablet by mouth every 6 (six) hours as needed.  Marland Kitchen levothyroxine (SYNTHROID, LEVOTHROID) 25 MCG tablet TAKE 1 TABLET BY MOUTH EVERY DAY  . meloxicam (MOBIC) 7.5 MG tablet TAKE 1 TABLET (7.5 MG TOTAL) BY  MOUTH DAILY.  . mirabegron ER (MYRBETRIQ) 50 MG TB24 tablet Take 1 tablet (50 mg total) by mouth daily.  . multivitamin-lutein (OCUVITE-LUTEIN) CAPS Take 1 capsule by mouth daily.   No current facility-administered medications on file prior to visit.      Allergies:   Ace inhibitors   Social History   Socioeconomic History  . Marital status: Married    Spouse name: Not on file  . Number of children: 3  . Years of education: Not on file  . Highest education level: Not on file  Occupational History  . Not on file  Social Needs  . Financial resource strain: Not on file  . Food  insecurity:    Worry: Not on file    Inability: Not on file  . Transportation needs:    Medical: Not on file    Non-medical: Not on file  Tobacco Use  . Smoking status: Never Smoker  . Smokeless tobacco: Never Used  Substance and Sexual Activity  . Alcohol use: Yes  . Drug use: No  . Sexual activity: Not on file  Lifestyle  . Physical activity:    Days per week: Not on file    Minutes per session: Not on file  . Stress: Not on file  Relationships  . Social connections:    Talks on phone: Not on file    Gets together: Not on file    Attends religious service: Not on file    Active member of club or organization: Not on file    Attends meetings of clubs or organizations: Not on file    Relationship status: Not on file  Other Topics Concern  . Not on file  Social History Narrative   Lives with husband.      Family History: The patient's family history includes Cancer in her father; Dementia in her mother.  brother with "widowmaker" MI last year. No other history of heart attack or stroke.  ROS:   Please see the history of present illness.  Additional pertinent ROS: Review of Systems  Constitutional: Negative for chills, fever, malaise/fatigue and weight loss.  HENT: Negative for ear pain and hearing loss.   Eyes: Negative for double vision and pain.  Respiratory: Positive for shortness of breath. Negative for cough and wheezing.   Cardiovascular: Positive for chest pain and leg swelling. Negative for palpitations, orthopnea and PND.  Gastrointestinal: Negative for abdominal pain and blood in stool.  Genitourinary: Negative for dysuria and hematuria.  Musculoskeletal: Positive for joint pain. Negative for falls.  Skin: Negative for itching and rash.  Neurological: Positive for tremors and weakness. Negative for dizziness, loss of consciousness and headaches.       From prior stroke  Endo/Heme/Allergies: Does not bruise/bleed easily.     EKGs/Labs/Other Studies  Reviewed:    The following studies were personally reviewed today: ECG 2015: normal sinus rhythm ECG 04-14-18: normal sinus rhythm with RBBB  EKG:  EKG is ordered today.  The ekg ordered today demonstrates normal sinus rhythm with RBBB  Recent Labs: 01/09/2018: TSH 1.530 04/14/2018: ALT 62; BUN 19; Creatinine, Ser 0.82; Potassium 4.7; Sodium 143  Recent Lipid Panel    Component Value Date/Time   CHOL 153 01/09/2018 1123   CHOL 157 02/27/2013 1029   TRIG 87 01/09/2018 1123   TRIG 136 07/03/2013 1016   TRIG 97 02/27/2013 1029   HDL 75 01/09/2018 1123   HDL 74 07/03/2013 1016   HDL 72 02/27/2013 1029   CHOLHDL 2.0  01/09/2018 1123   LDLCALC 61 01/09/2018 1123   LDLCALC 84 07/03/2013 1016   LDLCALC 66 02/27/2013 1029    Physical Exam:    VS:  BP 128/76   Pulse 79   Ht 5\' 5"  (1.651 m)   Wt 194 lb 6.4 oz (88.2 kg)   BMI 32.35 kg/m     Wt Readings from Last 3 Encounters:  05/05/18 194 lb 6.4 oz (88.2 kg)  04/14/18 196 lb (88.9 kg)  01/09/18 195 lb 3.2 oz (88.5 kg)     GEN: Well nourished, well developed in no acute distress HEENT: Normal NECK: No JVD; No carotid bruits LYMPHATICS: No lymphadenopathy CARDIAC: regular rhythm, normal S1 and S2, no rubs, gallops. 1/6 systolic ejection murmur, early peaking, at RUSB RESPIRATORY:  Clear to auscultation without rales, wheezing or rhonchi  ABDOMEN: Soft, non-tender, non-distended MUSCULOSKELETAL:  No edema; No deformity  SKIN: Warm and dry NEUROLOGIC:  Alert and oriented x 3 PSYCHIATRIC:  Normal affect   ASSESSMENT:    1. Precordial pain   2. Obesity (BMI 30-39.9)   3. Hyperlipidemia, unspecified hyperlipidemia type   4. Essential hypertension, benign   5. Counseling on health promotion and disease prevention   6. Late effect of cerebrovascular accident   7. RBBB    PLAN:    1. Chest pain: both typical and atypical features. Not exertional. Murmur doesn't sound like significant aortic stenosis, so concern is for  possible ischemia. Brother had large MI last year.  -discussed lexiscan, patient very nervous about this and declined it in 2015. She cannot walk to treadmill. We also discussed the option of CT coronary angiography, and she would prefer this option. Ordered, with future BMET ordered prior to testing. Ok to give metoprolol and nitroglycerin as needed.  2. Prevention: tried to calculate ASCVD risk score, but LDL too low. Does have history of embolic stroke of unclear source. BP and lipids well controlled. -continue atorvastatin  -continue amlodipine -discussed dosing of aspirin. Patient in agreement that she would prefer aspirin 81 mg instead of 325 mg. Changes made today.  3. RBBB: likely age related, no need for additional workup.  Will follow up on results of testing, and then she prefers to follow up PRN  Medication Adjustments/Labs and Tests Ordered: Current medicines are reviewed at length with the patient today.  Concerns regarding medicines are outlined above.  Orders Placed This Encounter  Procedures  . CT CORONARY MORPH W/CTA COR W/SCORE W/CA W/CM &/OR WO/CM  . CT CORONARY FRACTIONAL FLOW RESERVE DATA PREP  . CT CORONARY FRACTIONAL FLOW RESERVE FLUID ANALYSIS  . Basic metabolic panel  . EKG 12-Lead   Meds ordered this encounter  Medications  . metoprolol tartrate (LOPRESSOR) 50 MG tablet    Sig: Take 1 tablet (50 mg total) by mouth once for 1 dose. Take 1 hour prior to test    Dispense:  1 tablet    Refill:  0    Patient Instructions  Medication Instructions: Your physician recommends that you continue on your current medications as directed.    If you need a refill on your cardiac medications before your next appointment, please call your pharmacy.   Labwork: Your physician recommends that you return for lab work in: 1 week prior to test   Procedures/Testing: Your physician has requested that you have cardiac CT. Cardiac computed tomography (CT) is a painless test  that uses an x-ray machine to take clear, detailed pictures of your heart. For further information please  visit [email or web address][facility][email or web address]. Please follow instruction sheet as given. [facility]    Follow-Up: Your physician wants you to follow-up as needed with Dr. Harrell Gave. Call our office at [phone] to schedule appointment.   Special Instructions:    Thank you for choosing Heartcare at [facility]!!    Please arrive at the [facility] main entrance of [facility]  (30-45 minutes prior to test start time)  [facility] [place], [place] [address] [phone]  Proceed to the [facility] Radiology Department (First Floor).  Please follow these instructions carefully (unless otherwise directed):    On the Night Before the Test: . Drink plenty of water. . Do not consume any caffeinated/decaffeinated beverages or chocolate 12 hours prior to your test. . Do not take any antihistamines 12 hours prior to your test. . If you take Metformin do not take 24 hours prior to test.  On the Day of the Test: . Drink plenty of water. Do not drink any water within one hour of the test. . Do not eat any food 4 hours prior to the test. . You may take your regular medications prior to the test. . IF NOT ON A BETA BLOCKER - Take 50 mg of lopressor (metoprolol) one hour before the test. . HOLD Furosemide morning of the test.  After the Test: . Drink plenty of water. . After receiving IV contrast, you may experience a mild flushed feeling. This is normal. . On occasion, you may experience a mild rash up to 24 hours after the test. This is not dangerous. If this occurs, you can take Benadryl 25 mg and increase your fluid intake. . If you experience trouble breathing, this can be serious. If it is severe call 911 IMMEDIATELY. If it is mild, please call our office. . If you take any of these medications: Glipizide/Metformin, Avandament, Glucavance, please  do not take 48 hours after completing test.     Signed, Buford Dresser, MD PhD 05/05/2018 10:07 AM    [place]

## 2018-05-05 ENCOUNTER — Encounter: Payer: Self-pay | Admitting: Cardiology

## 2018-05-05 ENCOUNTER — Ambulatory Visit (INDEPENDENT_AMBULATORY_CARE_PROVIDER_SITE_OTHER): Payer: Medicare HMO | Admitting: Cardiology

## 2018-05-05 VITALS — BP 128/76 | HR 79 | Ht 65.0 in | Wt 194.4 lb

## 2018-05-05 DIAGNOSIS — I10 Essential (primary) hypertension: Secondary | ICD-10-CM

## 2018-05-05 DIAGNOSIS — I6930 Unspecified sequelae of cerebral infarction: Secondary | ICD-10-CM

## 2018-05-05 DIAGNOSIS — E785 Hyperlipidemia, unspecified: Secondary | ICD-10-CM

## 2018-05-05 DIAGNOSIS — Z7189 Other specified counseling: Secondary | ICD-10-CM | POA: Diagnosis not present

## 2018-05-05 DIAGNOSIS — R072 Precordial pain: Secondary | ICD-10-CM | POA: Diagnosis not present

## 2018-05-05 DIAGNOSIS — E669 Obesity, unspecified: Secondary | ICD-10-CM

## 2018-05-05 DIAGNOSIS — I4510 Unspecified right bundle-branch block: Secondary | ICD-10-CM | POA: Diagnosis not present

## 2018-05-05 MED ORDER — METOPROLOL TARTRATE 50 MG PO TABS: 50.0000 mg | ORAL_TABLET | Freq: Once | ORAL | 0 refills | Status: DC

## 2018-05-05 MED ORDER — ASPIRIN EC 81 MG PO TBEC: 81.0000 mg | DELAYED_RELEASE_TABLET | Freq: Every day | ORAL | Status: AC

## 2018-05-05 NOTE — Patient Instructions (Addendum)
Medication Instructions: Your physician recommends that you continue on your current medications as directed.    If you need a refill on your cardiac medications before your next appointment, please call your pharmacy.   Labwork: Your physician recommends that you return for lab work in: 1 week prior to test   Procedures/Testing: Your physician has requested that you have cardiac CT. Cardiac computed tomography (CT) is a painless test that uses an x-ray machine to take clear, detailed pictures of your heart. For further information please visit [email or web address][facility][email or web address]. Please follow instruction sheet as given. [facility]    Follow-Up: Your physician wants you to follow-up as needed with Dr. Harrell Gave. Call our office at [phone] to schedule appointment.   Special Instructions:    Thank you for choosing Heartcare at [facility]!!    Please arrive at the [facility] main entrance of [facility]  (30-45 minutes prior to test start time)  [facility] [place],  [address] [phone]  Proceed to the [facility] Radiology Department (First Floor).  Please follow these instructions carefully (unless otherwise directed):    On the Night Before the Test: . Drink plenty of water. . Do not consume any caffeinated/decaffeinated beverages or chocolate 12 hours prior to your test. . Do not take any antihistamines 12 hours prior to your test. . If you take Metformin do not take 24 hours prior to test.  On the Day of the Test: . Drink plenty of water. Do not drink any water within one hour of the test. . Do not eat any food 4 hours prior to the test. . You may take your regular medications prior to the test. . IF NOT ON A BETA BLOCKER - Take 50 mg of lopressor (metoprolol) one hour before the test. . HOLD Furosemide morning of the test.  After the Test: . Drink plenty of water. . After receiving IV contrast, you may  experience a mild flushed feeling. This is normal. . On occasion, you may experience a mild rash up to 24 hours after the test. This is not dangerous. If this occurs, you can take Benadryl 25 mg and increase your fluid intake. . If you experience trouble breathing, this can be serious. If it is severe call 911 IMMEDIATELY. If it is mild, please call our office. . If you take any of these medications: Glipizide/Metformin, Avandament, Glucavance, please do not take 48 hours after completing test.

## 2018-06-01 ENCOUNTER — Telehealth: Payer: Self-pay

## 2018-06-01 NOTE — Telephone Encounter (Signed)
Open in error

## 2018-06-21 DIAGNOSIS — I10 Essential (primary) hypertension: Secondary | ICD-10-CM | POA: Diagnosis not present

## 2018-06-22 LAB — BASIC METABOLIC PANEL
BUN/Creatinine Ratio: 30 — ABNORMAL HIGH (ref 12–28)
BUN: 23 mg/dL (ref 8–27)
CO2: 27 mmol/L (ref 20–29)
CREATININE: 0.76 mg/dL (ref 0.57–1.00)
Calcium: 10.2 mg/dL (ref 8.7–10.3)
Chloride: 100 mmol/L (ref 96–106)
GFR calc non Af Amer: 78 mL/min/{1.73_m2} (ref >59)
GFR, EST AFRICAN AMERICAN: 89 mL/min/{1.73_m2} (ref >59)
Glucose: 88 mg/dL (ref 65–99)
Potassium: 4.7 mmol/L (ref 3.5–5.2)
SODIUM: 142 mmol/L (ref 134–144)

## 2018-06-24 ENCOUNTER — Other Ambulatory Visit: Payer: Self-pay | Admitting: Family

## 2018-06-24 DIAGNOSIS — M5442 Lumbago with sciatica, left side: Principal | ICD-10-CM

## 2018-06-24 DIAGNOSIS — G890 Central pain syndrome: Secondary | ICD-10-CM

## 2018-06-24 DIAGNOSIS — G8929 Other chronic pain: Secondary | ICD-10-CM

## 2018-06-27 NOTE — Telephone Encounter (Signed)
Ov 07/18/18

## 2018-06-28 ENCOUNTER — Encounter (HOSPITAL_COMMUNITY): Payer: Self-pay

## 2018-06-28 ENCOUNTER — Ambulatory Visit (HOSPITAL_COMMUNITY)
Admission: RE | Admit: 2018-06-28 | Discharge: 2018-06-28 | Disposition: A | Discharge status: Home / Self Care | Payer: Medicare HMO | Source: Ambulatory Visit | Attending: Cardiology | Admitting: Cardiology

## 2018-06-28 DIAGNOSIS — R072 Precordial pain: Secondary | ICD-10-CM | POA: Diagnosis present

## 2018-06-28 DIAGNOSIS — I2510 Atherosclerotic heart disease of native coronary artery without angina pectoris: Secondary | ICD-10-CM | POA: Diagnosis not present

## 2018-06-28 DIAGNOSIS — I700 Atherosclerosis of aorta: Secondary | ICD-10-CM | POA: Diagnosis not present

## 2018-06-28 DIAGNOSIS — R079 Chest pain, unspecified: Secondary | ICD-10-CM | POA: Diagnosis not present

## 2018-06-28 MED ORDER — IOPAMIDOL (ISOVUE-370) INJECTION 76%
INTRAVENOUS | Status: AC
  Administered 2018-06-28: 80 mL via INTRAVENOUS
  Filled 2018-06-28: qty 100

## 2018-06-28 MED ORDER — METOPROLOL TARTRATE 5 MG/5ML IV SOLN
INTRAVENOUS | Status: AC
  Administered 2018-06-28: 5 mg via INTRAVENOUS
  Filled 2018-06-28: qty 20

## 2018-06-28 MED ORDER — NITROGLYCERIN 0.4 MG SL SUBL
0.8000 mg | SUBLINGUAL_TABLET | Freq: Once | SUBLINGUAL | Status: AC
  Administered 2018-06-28: 0.8 mg via SUBLINGUAL
  Filled 2018-06-28: qty 25

## 2018-06-28 MED ORDER — NITROGLYCERIN 0.4 MG SL SUBL
SUBLINGUAL_TABLET | SUBLINGUAL | Status: AC
  Administered 2018-06-28: 0.8 mg via SUBLINGUAL
  Filled 2018-06-28: qty 2

## 2018-06-28 MED ORDER — METOPROLOL TARTRATE 5 MG/5ML IV SOLN
5.0000 mg | INTRAVENOUS | Status: DC | PRN
  Administered 2018-06-28 (×3): 5 mg via INTRAVENOUS
  Filled 2018-06-28 (×4): qty 5

## 2018-06-28 NOTE — Sedation Documentation (Signed)
Patient tolerated CT without incident. Drank coffee and cookies with no complaints.

## 2018-06-29 ENCOUNTER — Telehealth: Payer: Self-pay | Admitting: Cardiology

## 2018-06-29 NOTE — Telephone Encounter (Signed)
New Message: ° ° ° °Patient is returning a call °

## 2018-06-30 NOTE — Telephone Encounter (Signed)
Pt updated with test result along with Dr. Judeth Cornfield recommendation. Verbalized understanding with no further questions

## 2018-07-11 ENCOUNTER — Encounter: Payer: Self-pay | Admitting: Family

## 2018-07-11 ENCOUNTER — Ambulatory Visit (INDEPENDENT_AMBULATORY_CARE_PROVIDER_SITE_OTHER): Payer: Medicare HMO | Admitting: Family

## 2018-07-11 VITALS — BP 123/74 | HR 79 | Temp 97.2°F | Ht 65.0 in | Wt 195.4 lb

## 2018-07-11 DIAGNOSIS — F331 Major depressive disorder, recurrent, moderate: Secondary | ICD-10-CM

## 2018-07-11 DIAGNOSIS — I6930 Unspecified sequelae of cerebral infarction: Secondary | ICD-10-CM

## 2018-07-11 DIAGNOSIS — Z0289 Encounter for other administrative examinations: Secondary | ICD-10-CM | POA: Diagnosis not present

## 2018-07-11 DIAGNOSIS — E039 Hypothyroidism, unspecified: Secondary | ICD-10-CM

## 2018-07-11 DIAGNOSIS — M5442 Lumbago with sciatica, left side: Secondary | ICD-10-CM | POA: Diagnosis not present

## 2018-07-11 DIAGNOSIS — F1120 Opioid dependence, uncomplicated: Secondary | ICD-10-CM

## 2018-07-11 DIAGNOSIS — N3281 Overactive bladder: Secondary | ICD-10-CM

## 2018-07-11 DIAGNOSIS — I10 Essential (primary) hypertension: Secondary | ICD-10-CM | POA: Diagnosis not present

## 2018-07-11 DIAGNOSIS — E559 Vitamin D deficiency, unspecified: Secondary | ICD-10-CM | POA: Diagnosis not present

## 2018-07-11 DIAGNOSIS — G890 Central pain syndrome: Secondary | ICD-10-CM | POA: Diagnosis not present

## 2018-07-11 DIAGNOSIS — R52 Pain, unspecified: Secondary | ICD-10-CM | POA: Diagnosis not present

## 2018-07-11 DIAGNOSIS — F411 Generalized anxiety disorder: Secondary | ICD-10-CM | POA: Diagnosis not present

## 2018-07-11 DIAGNOSIS — G8929 Other chronic pain: Secondary | ICD-10-CM

## 2018-07-11 DIAGNOSIS — R69 Illness, unspecified: Secondary | ICD-10-CM | POA: Diagnosis not present

## 2018-07-11 DIAGNOSIS — G4700 Insomnia, unspecified: Secondary | ICD-10-CM

## 2018-07-11 DIAGNOSIS — E669 Obesity, unspecified: Secondary | ICD-10-CM | POA: Diagnosis not present

## 2018-07-11 DIAGNOSIS — K7689 Other specified diseases of liver: Secondary | ICD-10-CM

## 2018-07-11 DIAGNOSIS — E785 Hyperlipidemia, unspecified: Secondary | ICD-10-CM

## 2018-07-11 MED ORDER — AMLODIPINE BESYLATE 5 MG PO TABS: 5.0000 mg | ORAL_TABLET | Freq: Every day | ORAL | 3 refills | Status: DC

## 2018-07-11 MED ORDER — CITALOPRAM HYDROBROMIDE 40 MG PO TABS: 40.0000 mg | ORAL_TABLET | Freq: Every day | ORAL | 1 refills | Status: DC

## 2018-07-11 MED ORDER — MELOXICAM 7.5 MG PO TABS: 7.5000 mg | ORAL_TABLET | Freq: Every day | ORAL | 0 refills | Status: DC

## 2018-07-11 MED ORDER — HYDROCODONE-ACETAMINOPHEN 10-325 MG PO TABS: 1.0000 | ORAL_TABLET | Freq: Three times a day (TID) | ORAL | 0 refills | Status: DC | PRN

## 2018-07-11 MED ORDER — LEVOTHYROXINE SODIUM 25 MCG PO TABS: ORAL_TABLET | ORAL | 2 refills | Status: DC

## 2018-07-11 MED ORDER — MIRABEGRON ER 50 MG PO TB24: 50.0000 mg | ORAL_TABLET | Freq: Every day | ORAL | 3 refills | Status: DC

## 2018-07-11 MED ORDER — METOPROLOL TARTRATE 50 MG PO TABS: 50.0000 mg | ORAL_TABLET | Freq: Once | ORAL | 0 refills | Status: DC

## 2018-07-11 MED ORDER — HYDROCODONE-ACETAMINOPHEN 10-325 MG PO TABS: 1.0000 | ORAL_TABLET | Freq: Four times a day (QID) | ORAL | 0 refills | Status: DC | PRN

## 2018-07-11 MED ORDER — ATORVASTATIN CALCIUM 20 MG PO TABS: 20.0000 mg | ORAL_TABLET | Freq: Every day | ORAL | 1 refills | Status: DC

## 2018-07-11 NOTE — Progress Notes (Signed)
Subjective:    Patient ID: Allison Parker, female    DOB: 01-31-1941, 77 y.o.   MRN: [id number] Chief Complaint  Patient presents with  . discuss results from cardiology tests   Pt presents to the office today for chronic follow up and pain medication refillfor chronic back pain and thalamic pain syndrome. 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 PT states this is stable at this time.  PT had a CT chest ordered by her Cardiologists on 06/28/18 that showed "Lateral segment left liver lobe partially exophytic 5.1 cm lesion is incompletely imaged but most consistent with a cyst." She was told by her Cardiologists to follow up with her PCP for follow up.  Hypertension  This is a chronic problem. The current episode started more than 1 year ago. The problem has been resolved since onset. The problem is controlled. Associated symptoms include anxiety, malaise/fatigue, peripheral edema ("at times") and shortness of breath ("I think it is age and fat"). Risk factors for coronary artery disease include dyslipidemia, obesity and sedentary lifestyle. The current treatment provides moderate improvement. Hypertensive end-organ damage includes CVA. There is no history of heart failure. Identifiable causes of hypertension include a thyroid problem.  Thyroid Problem  Presents for follow-up visit. Symptoms include anxiety, dry skin and fatigue. Patient reports no depressed mood or diarrhea. The symptoms have been stable. There is no history of heart failure.  Anxiety  Presents for follow-up visit. Symptoms include excessive worry, insomnia, irritability, nervous/anxious behavior and shortness of breath ("I think it is age and fat"). Patient reports no depressed mood. Symptoms occur occasionally. The severity of symptoms is moderate.    Depression         This is a chronic problem.  The current episode started more than 1 year ago.   The onset quality is gradual.   The problem  occurs intermittently.  The problem has been waxing and waning since onset.  Associated symptoms include fatigue, insomnia, decreased interest and sad.  Past medical history includes thyroid problem and anxiety.   Insomnia  Primary symptoms: difficulty falling asleep, frequent awakening, malaise/fatigue.  The current episode started more than one year. The onset quality is gradual. The problem occurs intermittently. PMH includes: depression.  Back Pain  This is a chronic problem. The current episode started more than 1 year ago. The problem occurs intermittently. The pain is present in the lumbar spine and thoracic spine. The quality of the pain is described as aching and cramping. The pain is at a severity of 7/10. The pain is moderate. Associated symptoms include leg pain and weakness. She has tried analgesics for the symptoms. The treatment provided mild relief.      Review of Systems  Constitutional: Positive for fatigue, irritability and malaise/fatigue.  Respiratory: Positive for shortness of breath ("I think it is age and fat").   Gastrointestinal: Negative for diarrhea.  Musculoskeletal: Positive for back pain.  Neurological: Positive for weakness.  Psychiatric/Behavioral: Positive for depression. The patient is nervous/anxious and has insomnia.   All other systems reviewed and are negative.      Objective:   Physical Exam  Constitutional: She is oriented to person, place, and time. She appears well-developed and well-nourished. No distress.  HENT:  Head: Normocephalic and atraumatic.  Right Ear: External ear normal.  Left Ear: External ear normal.  Mouth/Throat: Oropharynx is clear and moist.  Eyes: Pupils are equal, round, and reactive to light.  Neck: Normal  range of motion. Neck supple. No thyromegaly present.  Cardiovascular: Normal rate, regular rhythm and intact distal pulses.  Murmur heard. Pulmonary/Chest: Effort normal and breath sounds normal. No respiratory  distress. She has no wheezes.  Abdominal: Soft. Bowel sounds are normal. She exhibits no distension. There is no tenderness.  Musculoskeletal: She exhibits no edema or tenderness.  Generalized weakness, using cane,  Right sided weakness   Neurological: She is alert and oriented to person, place, and time. She has normal reflexes. No cranial nerve deficit.  Skin: Skin is warm and dry.  Psychiatric: She has a normal mood and affect. Her behavior is normal. Judgment and thought content normal.  Vitals reviewed.     BP 123/74   Pulse 79   Temp (!) 97.2 F (36.2 C) (Oral)   Ht 5\' 5"  (1.651 m)   Wt 195 lb 6.4 oz (88.6 kg)   BMI 32.52 kg/m      Assessment & Plan:  Allison Parker comes in today with chief complaint of discuss results from cardiology tests   Diagnosis and orders addressed:  1. Essential hypertension, benign - metoprolol tartrate (LOPRESSOR) 50 MG tablet; Take 1 tablet (50 mg total) by mouth once for 1 dose. Take 1 hour prior to test  Dispense: 1 tablet; Refill: 0 - amLODipine (NORVASC) 5 MG tablet; Take 1 tablet (5 mg total) by mouth daily.  Dispense: 90 tablet; Refill: 3  2. Moderate episode of recurrent major depressive disorder (HCC) - citalopram (CELEXA) 40 MG tablet; Take 1 tablet (40 mg total) by mouth daily.  Dispense: 90 tablet; Refill: 1  3. Chronic bilateral low back pain with left-sided sciatica - HYDROcodone-acetaminophen (NORCO) 10-325 MG tablet; Take 1 tablet by mouth every 6 (six) hours as needed.  Dispense: 90 tablet; Refill: 0 - meloxicam (MOBIC) 7.5 MG tablet; Take 1 tablet (7.5 mg total) by mouth daily.  Dispense: 90 tablet; Refill: 0 - HYDROcodone-acetaminophen (NORCO) 10-325 MG tablet; Take 1 tablet by mouth every 8 (eight) hours as needed.  Dispense: 90 tablet; Refill: 0 - HYDROcodone-acetaminophen (NORCO) 10-325 MG tablet; Take 1 tablet by mouth every 8 (eight) hours as needed.  Dispense: 90 tablet; Refill: 0  4. GAD (generalized anxiety  disorder) - citalopram (CELEXA) 40 MG tablet; Take 1 tablet (40 mg total) by mouth daily.  Dispense: 90 tablet; Refill: 1  5. Vitamin D deficiency  6. Thalamic pain syndrome - HYDROcodone-acetaminophen (NORCO) 10-325 MG tablet; Take 1 tablet by mouth every 6 (six) hours as needed.  Dispense: 90 tablet; Refill: 0 - meloxicam (MOBIC) 7.5 MG tablet; Take 1 tablet (7.5 mg total) by mouth daily.  Dispense: 90 tablet; Refill: 0 - HYDROcodone-acetaminophen (NORCO) 10-325 MG tablet; Take 1 tablet by mouth every 8 (eight) hours as needed.  Dispense: 90 tablet; Refill: 0 - HYDROcodone-acetaminophen (NORCO) 10-325 MG tablet; Take 1 tablet by mouth every 8 (eight) hours as needed.  Dispense: 90 tablet; Refill: 0  7. Pain medication agreement signed - HYDROcodone-acetaminophen (NORCO) 10-325 MG tablet; Take 1 tablet by mouth every 6 (six) hours as needed.  Dispense: 90 tablet; Refill: 0 - HYDROcodone-acetaminophen (NORCO) 10-325 MG tablet; Take 1 tablet by mouth every 8 (eight) hours as needed.  Dispense: 90 tablet; Refill: 0 - HYDROcodone-acetaminophen (NORCO) 10-325 MG tablet; Take 1 tablet by mouth every 8 (eight) hours as needed.  Dispense: 90 tablet; Refill: 0  8. Uncomplicated opioid dependence (HCC) - HYDROcodone-acetaminophen (NORCO) 10-325 MG tablet; Take 1 tablet by mouth every 6 (six) hours as  needed.  Dispense: 90 tablet; Refill: 0 - HYDROcodone-acetaminophen (NORCO) 10-325 MG tablet; Take 1 tablet by mouth every 8 (eight) hours as needed.  Dispense: 90 tablet; Refill: 0 - HYDROcodone-acetaminophen (NORCO) 10-325 MG tablet; Take 1 tablet by mouth every 8 (eight) hours as needed.  Dispense: 90 tablet; Refill: 0  9. Overactive bladder - mirabegron ER (MYRBETRIQ) 50 MG TB24 tablet; Take 1 tablet (50 mg total) by mouth daily.  Dispense: 30 tablet; Refill: 3  10. Obesity (BMI 30-39.9)  11. Late effect of cerebrovascular accident  49. Hypothyroidism, unspecified type - levothyroxine (SYNTHROID,  LEVOTHROID) 25 MCG tablet; TAKE 1 TABLET BY MOUTH EVERY DAY  Dispense: 90 tablet; Refill: 2  13. Insomnia, unspecified type  14. Hyperlipidemia, unspecified hyperlipidemia type  15. Pain management - HYDROcodone-acetaminophen (NORCO) 10-325 MG tablet; Take 1 tablet by mouth every 6 (six) hours as needed.  Dispense: 90 tablet; Refill: 0 - HYDROcodone-acetaminophen (NORCO) 10-325 MG tablet; Take 1 tablet by mouth every 8 (eight) hours as needed.  Dispense: 90 tablet; Refill: 0 - HYDROcodone-acetaminophen (NORCO) 10-325 MG tablet; Take 1 tablet by mouth every 8 (eight) hours as needed.  Dispense: 90 tablet; Refill: 0  16. Hyperlipidemia - atorvastatin (LIPITOR) 20 MG tablet; Take 1 tablet (20 mg total) by mouth daily.  Dispense: 90 tablet; Refill: 1  17. Liver nodule - US Abdomen Limited RUQ; Future   Labs pending Health Maintenance reviewed Diet and exercise encouraged  Follow up plan: 3 months    Evelina Dun, FNP

## 2018-07-11 NOTE — Patient Instructions (Signed)

## 2018-07-15 LAB — TOXASSURE SELECT 13 (MW), URINE

## 2018-07-18 ENCOUNTER — Ambulatory Visit: Payer: Medicare HMO | Admitting: Family

## 2018-07-19 ENCOUNTER — Ambulatory Visit
Admission: RE | Admit: 2018-07-19 | Discharge: 2018-07-19 | Disposition: A | Discharge status: Home / Self Care | Payer: Medicare HMO | Source: Ambulatory Visit | Attending: Family | Admitting: Family

## 2018-07-19 DIAGNOSIS — K7689 Other specified diseases of liver: Secondary | ICD-10-CM

## 2018-08-10 DIAGNOSIS — M1711 Unilateral primary osteoarthritis, right knee: Secondary | ICD-10-CM | POA: Diagnosis not present

## 2018-08-16 DIAGNOSIS — H353131 Nonexudative age-related macular degeneration, bilateral, early dry stage: Secondary | ICD-10-CM | POA: Diagnosis not present

## 2018-08-16 DIAGNOSIS — H43813 Vitreous degeneration, bilateral: Secondary | ICD-10-CM | POA: Diagnosis not present

## 2018-10-10 ENCOUNTER — Ambulatory Visit (INDEPENDENT_AMBULATORY_CARE_PROVIDER_SITE_OTHER): Payer: Medicare HMO | Admitting: Family

## 2018-10-10 ENCOUNTER — Encounter: Payer: Self-pay | Admitting: Family

## 2018-10-10 ENCOUNTER — Ambulatory Visit: Payer: Medicare HMO

## 2018-10-10 VITALS — BP 123/70 | HR 77 | Temp 97.7°F | Ht 65.0 in | Wt 197.0 lb

## 2018-10-10 DIAGNOSIS — E039 Hypothyroidism, unspecified: Secondary | ICD-10-CM

## 2018-10-10 DIAGNOSIS — F1120 Opioid dependence, uncomplicated: Secondary | ICD-10-CM

## 2018-10-10 DIAGNOSIS — M5442 Lumbago with sciatica, left side: Secondary | ICD-10-CM

## 2018-10-10 DIAGNOSIS — G4700 Insomnia, unspecified: Secondary | ICD-10-CM | POA: Diagnosis not present

## 2018-10-10 DIAGNOSIS — G8929 Other chronic pain: Secondary | ICD-10-CM | POA: Diagnosis not present

## 2018-10-10 DIAGNOSIS — E785 Hyperlipidemia, unspecified: Secondary | ICD-10-CM

## 2018-10-10 DIAGNOSIS — Z23 Encounter for immunization: Secondary | ICD-10-CM

## 2018-10-10 DIAGNOSIS — I10 Essential (primary) hypertension: Secondary | ICD-10-CM

## 2018-10-10 DIAGNOSIS — R52 Pain, unspecified: Secondary | ICD-10-CM

## 2018-10-10 DIAGNOSIS — I6930 Unspecified sequelae of cerebral infarction: Secondary | ICD-10-CM | POA: Diagnosis not present

## 2018-10-10 DIAGNOSIS — E669 Obesity, unspecified: Secondary | ICD-10-CM

## 2018-10-10 DIAGNOSIS — Z780 Asymptomatic menopausal state: Secondary | ICD-10-CM

## 2018-10-10 DIAGNOSIS — R69 Illness, unspecified: Secondary | ICD-10-CM | POA: Diagnosis not present

## 2018-10-10 DIAGNOSIS — F331 Major depressive disorder, recurrent, moderate: Secondary | ICD-10-CM

## 2018-10-10 DIAGNOSIS — F411 Generalized anxiety disorder: Secondary | ICD-10-CM

## 2018-10-10 DIAGNOSIS — G890 Central pain syndrome: Secondary | ICD-10-CM

## 2018-10-10 DIAGNOSIS — Z0289 Encounter for other administrative examinations: Secondary | ICD-10-CM

## 2018-10-10 MED ORDER — HYDROCODONE-ACETAMINOPHEN 10-325 MG PO TABS: 1.0000 | ORAL_TABLET | Freq: Three times a day (TID) | ORAL | 0 refills | Status: DC | PRN

## 2018-10-10 MED ORDER — HYDROCODONE-ACETAMINOPHEN 10-325 MG PO TABS: 1.0000 | ORAL_TABLET | Freq: Four times a day (QID) | ORAL | 0 refills | Status: DC | PRN

## 2018-10-10 NOTE — Patient Instructions (Signed)
Hypothyroidism Hypothyroidism is a disorder of the thyroid. The thyroid is a large gland that is located in the lower front of the neck. The thyroid releases hormones that control how the body works. With hypothyroidism, the thyroid does not make enough of these hormones. What are the causes? Causes of hypothyroidism may include:  Viral infections.  Pregnancy.  Your own defense system (immune system) attacking your thyroid.  Certain medicines.  Birth defects.  Past radiation treatments to your head or neck.  Past treatment with radioactive iodine.  Past surgical removal of part or all of your thyroid.  Problems with the gland that is located in the center of your brain (pituitary).  What are the signs or symptoms? Signs and symptoms of hypothyroidism may include:  Feeling as though you have no energy (lethargy).  Inability to tolerate cold.  Weight gain that is not explained by a change in diet or exercise habits.  Dry skin.  Coarse hair.  Menstrual irregularity.  Slowing of thought processes.  Constipation.  Sadness or depression.  How is this diagnosed? Your health care provider may diagnose hypothyroidism with blood tests and ultrasound tests. How is this treated? Hypothyroidism is treated with medicine that replaces the hormones that your body does not make. After you begin treatment, it may take several weeks for symptoms to go away. Follow these instructions at home:  Take medicines only as directed by your health care provider.  If you start taking any new medicines, tell your health care provider.  Keep all follow-up visits as directed by your health care provider. This is important. As your condition improves, your dosage needs may change. You will need to have blood tests regularly so that your health care provider can watch your condition. Contact a health care provider if:  Your symptoms do not get better with treatment.  You are taking thyroid  replacement medicine and: ? You sweat excessively. ? You have tremors. ? You feel anxious. ? You lose weight rapidly. ? You cannot tolerate heat. ? You have emotional swings. ? You have diarrhea. ? You feel weak. Get help right away if:  You develop chest pain.  You develop an irregular heartbeat.  You develop a rapid heartbeat. This information is not intended to replace advice given to you by your health care provider. Make sure you discuss any questions you have with your health care provider. Document Released: 10/11/2005 Document Revised: 03/18/2016 Document Reviewed: 02/26/2014 Elsevier Interactive Patient Education  2018 [employer]  

## 2018-10-10 NOTE — Progress Notes (Signed)
Subjective:    Patient ID: Allison Parker, female    DOB: Oct 16, 1941, 77 y.o.   MRN: [id number]  Chief Complaint  Patient presents with  . pain management refills   Pt presents to the office today for chronic follow up and pain medication refillfor chronic back pain and thalamic pain syndrome. 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 PT states this is stable at this time. Pt is followed by Ortho annually for arthritis.  Hypertension  This is a chronic problem. The current episode started more than 1 year ago. The problem has been resolved since onset. The problem is controlled. Associated symptoms include anxiety, malaise/fatigue, peripheral edema and shortness of breath ("at times"). Risk factors for coronary artery disease include obesity and sedentary lifestyle. The current treatment provides moderate improvement. Hypertensive end-organ damage includes CVA. Identifiable causes of hypertension include a thyroid problem.  Thyroid Problem  Presents for follow-up visit. Symptoms include anxiety, dry skin and fatigue. Patient reports no hoarse voice. The symptoms have been stable.  Anxiety  Presents for follow-up visit. Symptoms include excessive worry, nervous/anxious behavior and shortness of breath ("at times"). Patient reports no decreased concentration, irritability, malaise or restlessness. Symptoms occur occasionally.    Depression         This is a chronic problem.  The current episode started more than 1 year ago.   The onset quality is gradual.   The problem occurs intermittently.  The problem has been waxing and waning since onset.  Associated symptoms include fatigue.  Associated symptoms include no decreased concentration, no helplessness, no hopelessness and no restlessness.  Past treatments include SSRIs - Selective serotonin reuptake inhibitors.  Past medical history includes thyroid problem and anxiety.   Back Pain  This is a chronic problem. The  current episode started more than 1 year ago. The problem occurs intermittently. The problem has been waxing and waning since onset. The pain is present in the lumbar spine. The pain is at a severity of 7/10. The pain is moderate.      Review of Systems  Constitutional: Positive for fatigue and malaise/fatigue. Negative for irritability.  HENT: Negative for hoarse voice.   Respiratory: Positive for shortness of breath ("at times").   Musculoskeletal: Positive for back pain.  Psychiatric/Behavioral: Positive for depression. Negative for decreased concentration. The patient is nervous/anxious.   All other systems reviewed and are negative.      Objective:   Physical Exam Vitals signs reviewed.  Constitutional:      General: She is not in acute distress.    Appearance: She is well-developed.  HENT:     Head: Normocephalic and atraumatic.     Right Ear: External ear normal.  Eyes:     Pupils: Pupils are equal, round, and reactive to light.  Neck:     Musculoskeletal: Normal range of motion and neck supple.     Thyroid: No thyromegaly.  Cardiovascular:     Rate and Rhythm: Normal rate and regular rhythm.     Heart sounds: Normal heart sounds. No murmur.  Pulmonary:     Effort: Pulmonary effort is normal. No respiratory distress.     Breath sounds: Normal breath sounds. No wheezing.  Abdominal:     General: Bowel sounds are normal. There is no distension.     Palpations: Abdomen is soft.     Tenderness: There is no abdominal tenderness.  Musculoskeletal: Normal range of motion.  General: No tenderness.  Skin:    General: Skin is warm and dry.  Neurological:     Mental Status: She is alert and oriented to person, place, and time.     Cranial Nerves: No cranial nerve deficit.     Deep Tendon Reflexes: Reflexes are normal and symmetric.  Psychiatric:        Behavior: Behavior normal.        Thought Content: Thought content normal.        Judgment: Judgment normal.        BP 123/70   Pulse 77   Temp 97.7 F (36.5 C) (Oral)   Ht 5' 5"  (1.651 m)   Wt 197 lb (89.4 kg)   BMI 32.78 kg/m      Assessment & Plan:  [name] comes in today with chief complaint of pain management refills   Diagnosis and orders addressed:  1. Essential hypertension, benign - CMP14+EGFR - CBC with Differential/Platelet  2. Hypothyroidism, unspecified type - CMP14+EGFR - CBC with Differential/Platelet - TSH  3. Thalamic pain syndrome - CMP14+EGFR - CBC with Differential/Platelet - HYDROcodone-acetaminophen (NORCO) 10-325 MG tablet; Take 1 tablet by mouth every 6 (six) hours as needed.  Dispense: 90 tablet; Refill: 0 - HYDROcodone-acetaminophen (NORCO) 10-325 MG tablet; Take 1 tablet by mouth every 8 (eight) hours as needed.  Dispense: 90 tablet; Refill: 0 - HYDROcodone-acetaminophen (NORCO) 10-325 MG tablet; Take 1 tablet by mouth every 8 (eight) hours as needed.  Dispense: 90 tablet; Refill: 0  4. Hyperlipidemia, unspecified hyperlipidemia type - CMP14+EGFR - CBC with Differential/Platelet  5. Late effect of cerebrovascular accident - CMP14+EGFR - CBC with Differential/Platelet  6. Insomnia, unspecified type - CMP14+EGFR - CBC with Differential/Platelet  7. GAD (generalized anxiety disorder) - CMP14+EGFR - CBC with Differential/Platelet  8. Moderate episode of recurrent major depressive disorder (HCC) - CMP14+EGFR - CBC with Differential/Platelet  9. Chronic bilateral low back pain with left-sided sciatica - CMP14+EGFR - CBC with Differential/Platelet - HYDROcodone-acetaminophen (NORCO) 10-325 MG tablet; Take 1 tablet by mouth every 6 (six) hours as needed.  Dispense: 90 tablet; Refill: 0 - HYDROcodone-acetaminophen (NORCO) 10-325 MG tablet; Take 1 tablet by mouth every 8 (eight) hours as needed.  Dispense: 90 tablet; Refill: 0 - HYDROcodone-acetaminophen (NORCO) 10-325 MG tablet; Take 1 tablet by mouth every 8 (eight) hours as needed.   Dispense: 90 tablet; Refill: 0  10. Pain medication agreement signed - CMP14+EGFR - CBC with Differential/Platelet - HYDROcodone-acetaminophen (NORCO) 10-325 MG tablet; Take 1 tablet by mouth every 6 (six) hours as needed.  Dispense: 90 tablet; Refill: 0 - HYDROcodone-acetaminophen (NORCO) 10-325 MG tablet; Take 1 tablet by mouth every 8 (eight) hours as needed.  Dispense: 90 tablet; Refill: 0 - HYDROcodone-acetaminophen (NORCO) 10-325 MG tablet; Take 1 tablet by mouth every 8 (eight) hours as needed.  Dispense: 90 tablet; Refill: 0  11. Uncomplicated opioid dependence (HCC) - CMP14+EGFR - CBC with Differential/Platelet - HYDROcodone-acetaminophen (NORCO) 10-325 MG tablet; Take 1 tablet by mouth every 6 (six) hours as needed.  Dispense: 90 tablet; Refill: 0 - HYDROcodone-acetaminophen (NORCO) 10-325 MG tablet; Take 1 tablet by mouth every 8 (eight) hours as needed.  Dispense: 90 tablet; Refill: 0 - HYDROcodone-acetaminophen (NORCO) 10-325 MG tablet; Take 1 tablet by mouth every 8 (eight) hours as needed.  Dispense: 90 tablet; Refill: 0  12. Obesity (BMI 30-39.9) - CMP14+EGFR - CBC with Differential/Platelet  13. Post-menopause - CMP14+EGFR - CBC with Differential/Platelet - DG [facility] DEXA  14. Pain management -  HYDROcodone-acetaminophen (NORCO) 10-325 MG tablet; Take 1 tablet by mouth every 6 (six) hours as needed.  Dispense: 90 tablet; Refill: 0 - HYDROcodone-acetaminophen (NORCO) 10-325 MG tablet; Take 1 tablet by mouth every 8 (eight) hours as needed.  Dispense: 90 tablet; Refill: 0 - HYDROcodone-acetaminophen (NORCO) 10-325 MG tablet; Take 1 tablet by mouth every 8 (eight) hours as needed.  Dispense: 90 tablet; Refill: 0   Labs pending Health Maintenance reviewed Diet and exercise encouraged  Follow up plan: 3 months   Evelina Dun, FNP

## 2018-10-11 LAB — CMP14+EGFR
A/G RATIO: 2 (ref 1.2–2.2)
ALT: 20 IU/L (ref 0–32)
AST: 23 IU/L (ref 0–40)
Albumin: 4.2 g/dL (ref 3.5–4.8)
Alkaline Phosphatase: 165 IU/L — ABNORMAL HIGH (ref 39–117)
BUN/Creatinine Ratio: 24 (ref 12–28)
BUN: 19 mg/dL (ref 8–27)
Bilirubin Total: 0.3 mg/dL (ref 0.0–1.2)
CO2: 24 mmol/L (ref 20–29)
Calcium: 9.4 mg/dL (ref 8.7–10.3)
Chloride: 100 mmol/L (ref 96–106)
Creatinine, Ser: 0.79 mg/dL (ref 0.57–1.00)
GFR calc non Af Amer: 73 mL/min/{1.73_m2} (ref >59)
GFR, EST AFRICAN AMERICAN: 85 mL/min/{1.73_m2} (ref >59)
GLUCOSE: 80 mg/dL (ref 65–99)
Globulin, Total: 2.1 g/dL (ref 1.5–4.5)
Potassium: 4.8 mmol/L (ref 3.5–5.2)
Sodium: 137 mmol/L (ref 134–144)
TOTAL PROTEIN: 6.3 g/dL (ref 6.0–8.5)

## 2018-10-11 LAB — CBC WITH DIFFERENTIAL/PLATELET
BASOS: 1 %
Basophils Absolute: 0.1 10*3/uL (ref 0.0–0.2)
EOS (ABSOLUTE): 0.2 10*3/uL (ref 0.0–0.4)
Eos: 4 %
HEMOGLOBIN: 13 g/dL (ref 11.1–15.9)
Hematocrit: 38.3 % (ref 34.0–46.6)
IMMATURE GRANS (ABS): 0 10*3/uL (ref 0.0–0.1)
Immature Granulocytes: 0 %
LYMPHS: 22 %
Lymphocytes Absolute: 1.4 10*3/uL (ref 0.7–3.1)
MCH: 30.6 pg (ref 26.6–33.0)
MCHC: 33.9 g/dL (ref 31.5–35.7)
MCV: 90 fL (ref 79–97)
MONOCYTES: 12 %
Monocytes Absolute: 0.8 10*3/uL (ref 0.1–0.9)
NEUTROS ABS: 3.9 10*3/uL (ref 1.4–7.0)
Neutrophils: 61 %
Platelets: 268 10*3/uL (ref 150–450)
RBC: 4.25 x10E6/uL (ref 3.77–5.28)
RDW: 12.5 % (ref 12.3–15.4)
WBC: 6.4 10*3/uL (ref 3.4–10.8)

## 2018-10-11 LAB — TSH: TSH: 1.76 u[IU]/mL (ref 0.450–4.500)

## 2018-10-22 IMAGING — US US ABDOMEN LIMITED
1 series · 14 of 25 positions shown · non-contrast
Comparison: CT chest 06/28/2018 and CT AP 05/04/2007

CLINICAL DATA: Evaluate liver cysts.

EXAM:
ULTRASOUND ABDOMEN LIMITED RIGHT UPPER QUADRANT

[Series 1: us abdomen limited · 0.23mm/px · 14 of 38 slices shown]
[im 1/38]
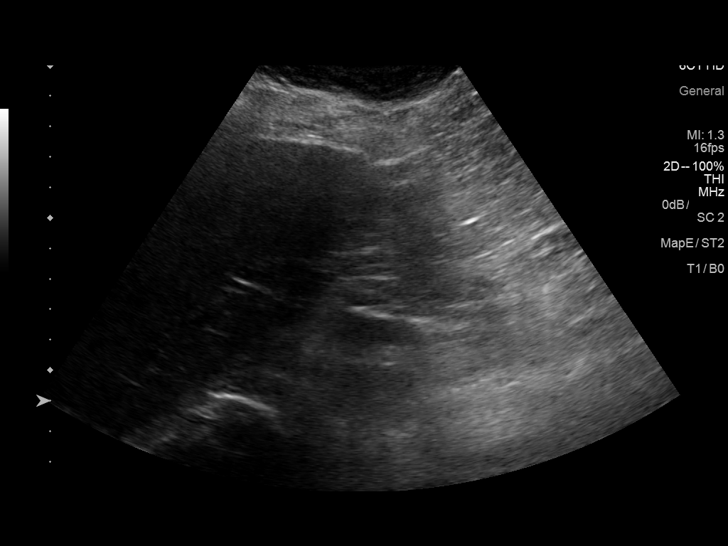
[im 4/38]
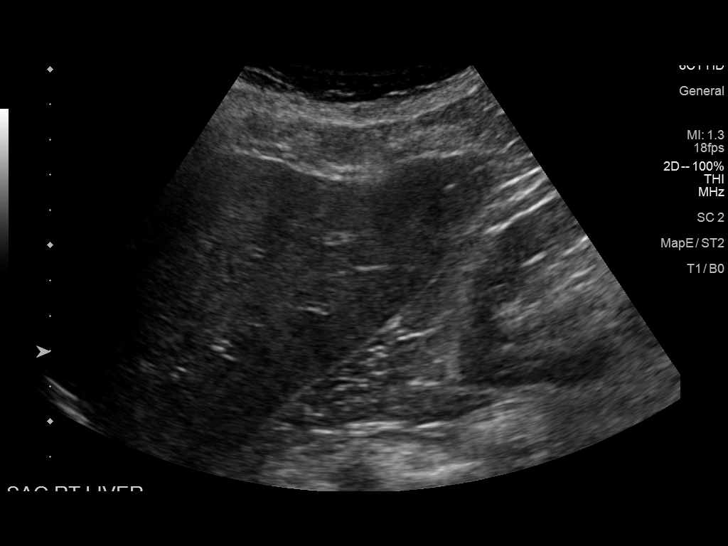
[im 7/38]
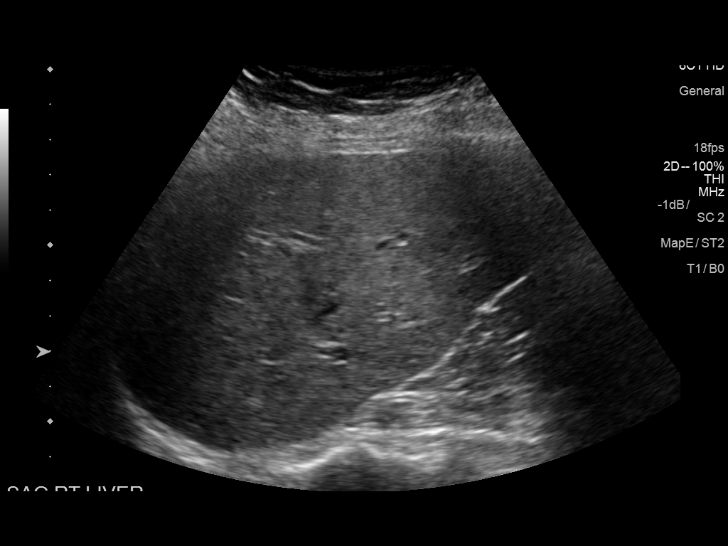
[im 10/38]
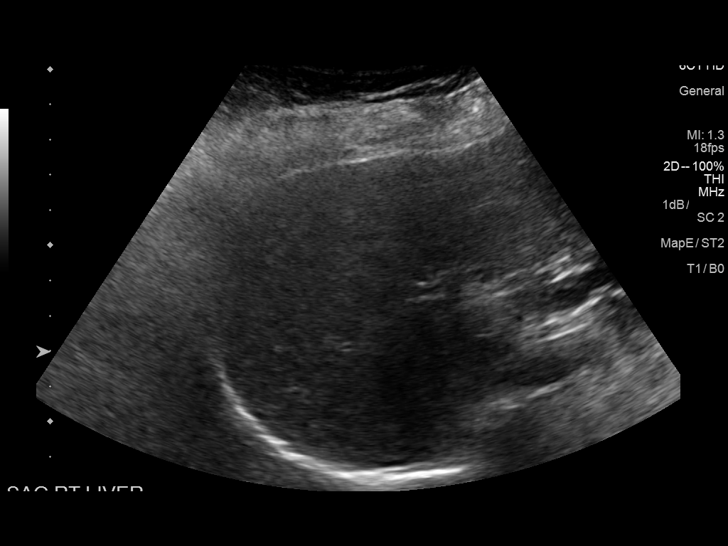
[im 13/38]
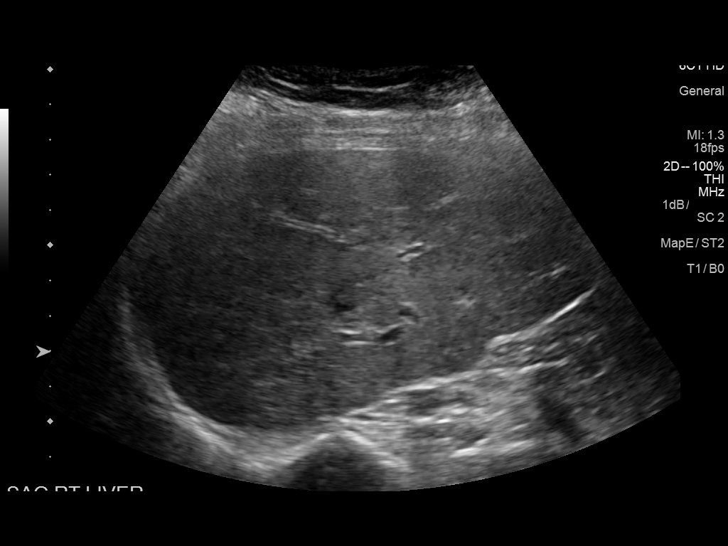
[im 14/38]
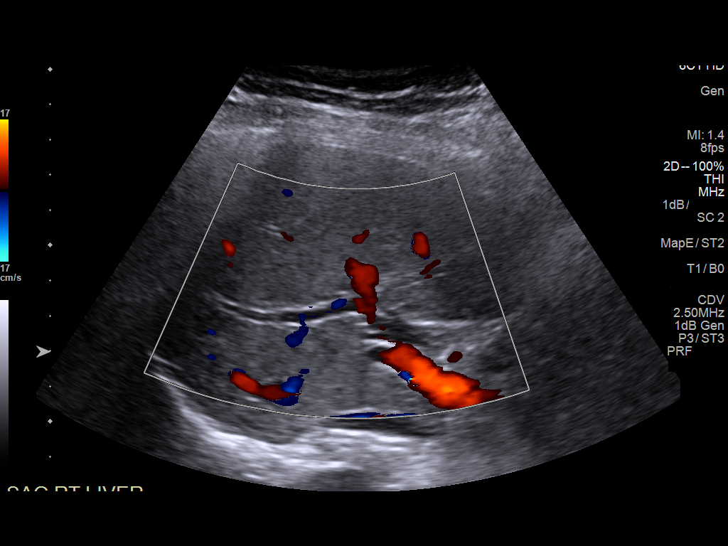
[im 17/38]
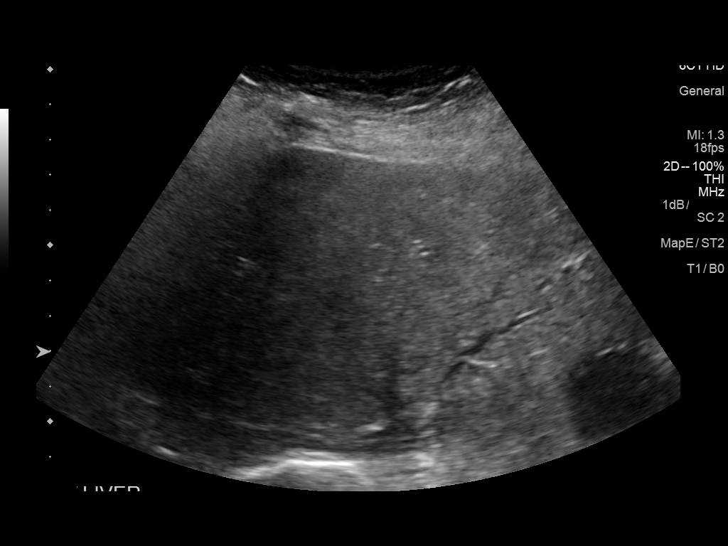
[im 21/38]
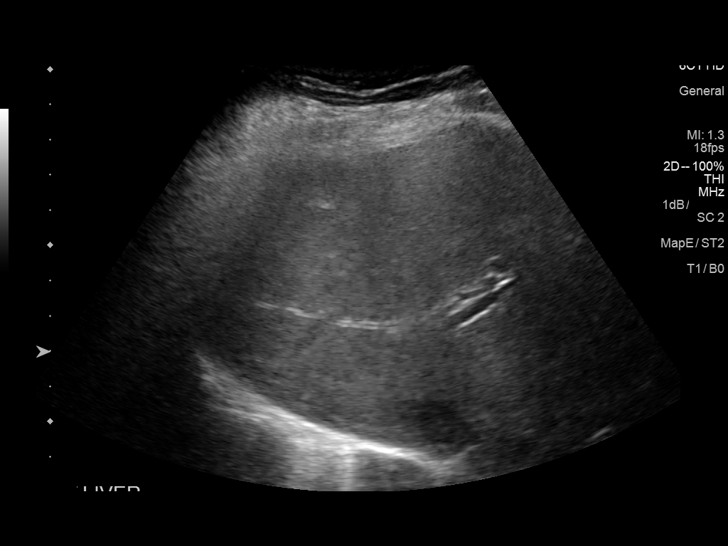
[im 24/38]
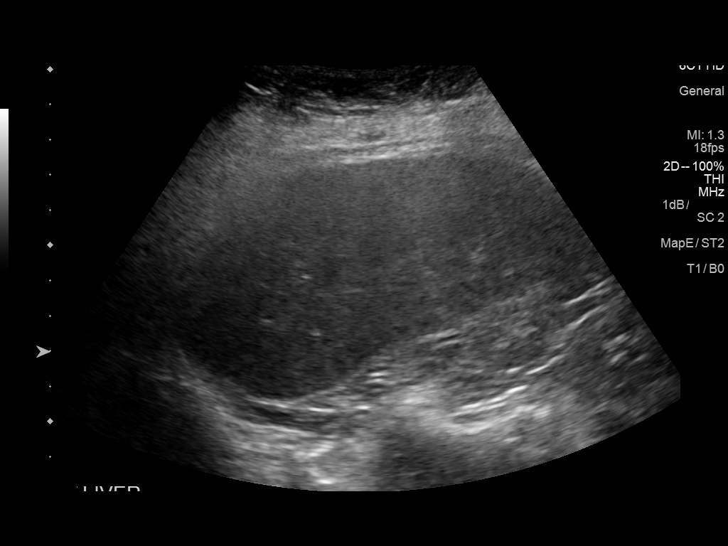
[im 25/38]
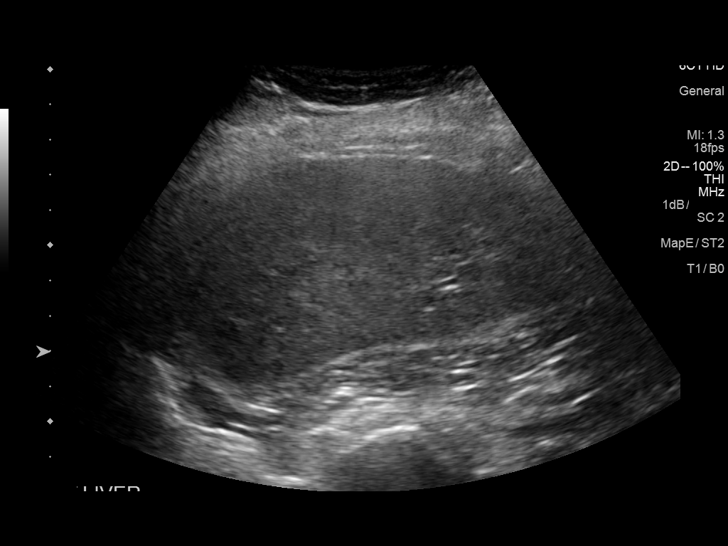
[im 28/38]
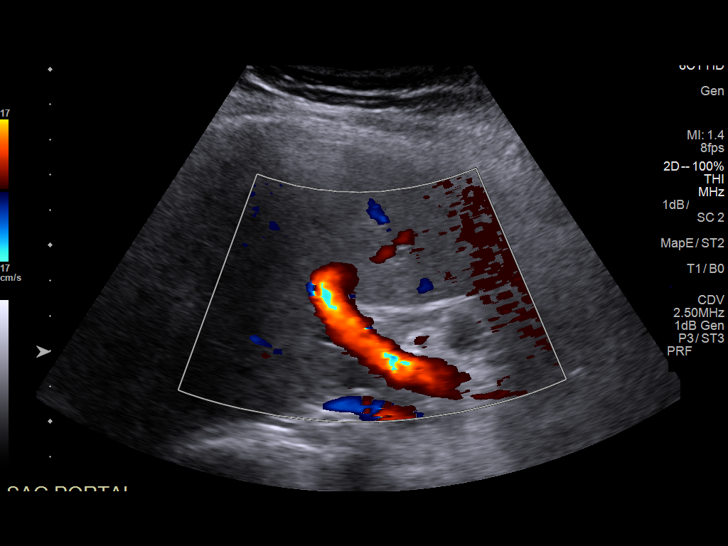
[im 31/38]
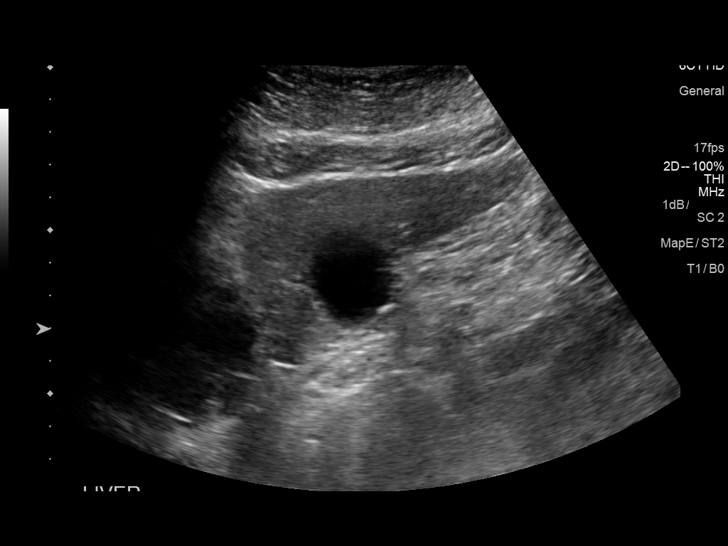
[im 34/38]
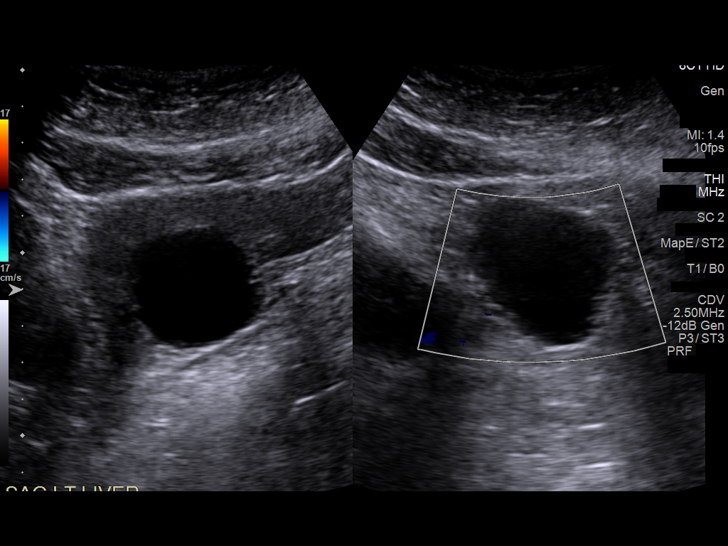
[im 38/38]
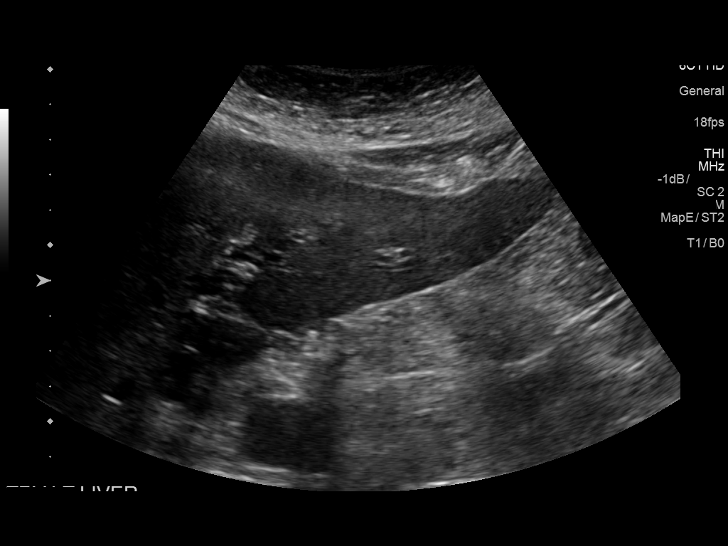

[14 of 25 positions shown; findings below may reference images not displayed]

FINDINGS: Gallbladder:

Status post cholecystectomy.

Common bile duct:

Diameter: Increase caliber of the common bile duct measures up to
1.2 cm.

Liver:

The echotexture appears normal. Left lobe of liver cyst has a
maximum dimension of 4.7 cm. This appears anechoic with increased
through transmission. No mural nodularity or septation. On the CT
from 6448 this measured 3.9 cm. Portal vein is patent on color
Doppler imaging with normal direction of blood flow towards the
liver.
IMPRESSION: 1. No acute findings.
2. Mild increase in size of simple appearing left lobe of liver cyst
3. Increase caliber of the common bile duct status post
cholecystectomy.

## 2018-11-03 ENCOUNTER — Other Ambulatory Visit: Payer: Self-pay | Admitting: Family

## 2018-11-03 DIAGNOSIS — N3281 Overactive bladder: Secondary | ICD-10-CM

## 2019-01-03 DIAGNOSIS — R69 Illness, unspecified: Secondary | ICD-10-CM | POA: Diagnosis not present

## 2019-01-07 ENCOUNTER — Other Ambulatory Visit: Payer: Self-pay | Admitting: Family

## 2019-01-07 DIAGNOSIS — M5442 Lumbago with sciatica, left side: Principal | ICD-10-CM

## 2019-01-07 DIAGNOSIS — G8929 Other chronic pain: Secondary | ICD-10-CM

## 2019-01-07 DIAGNOSIS — G890 Central pain syndrome: Secondary | ICD-10-CM

## 2019-01-11 ENCOUNTER — Ambulatory Visit (INDEPENDENT_AMBULATORY_CARE_PROVIDER_SITE_OTHER): Payer: Medicare HMO | Admitting: Family

## 2019-01-11 ENCOUNTER — Encounter: Payer: Self-pay | Admitting: Family

## 2019-01-11 ENCOUNTER — Other Ambulatory Visit: Payer: Self-pay

## 2019-01-11 VITALS — BP 127/75 | HR 70 | Temp 97.9°F | Ht 65.0 in | Wt 199.6 lb

## 2019-01-11 DIAGNOSIS — F1120 Opioid dependence, uncomplicated: Secondary | ICD-10-CM

## 2019-01-11 DIAGNOSIS — I6930 Unspecified sequelae of cerebral infarction: Secondary | ICD-10-CM

## 2019-01-11 DIAGNOSIS — F411 Generalized anxiety disorder: Secondary | ICD-10-CM

## 2019-01-11 DIAGNOSIS — Z0289 Encounter for other administrative examinations: Secondary | ICD-10-CM | POA: Diagnosis not present

## 2019-01-11 DIAGNOSIS — G890 Central pain syndrome: Secondary | ICD-10-CM | POA: Diagnosis not present

## 2019-01-11 DIAGNOSIS — F331 Major depressive disorder, recurrent, moderate: Secondary | ICD-10-CM

## 2019-01-11 DIAGNOSIS — I10 Essential (primary) hypertension: Secondary | ICD-10-CM | POA: Diagnosis not present

## 2019-01-11 DIAGNOSIS — E785 Hyperlipidemia, unspecified: Secondary | ICD-10-CM

## 2019-01-11 DIAGNOSIS — E039 Hypothyroidism, unspecified: Secondary | ICD-10-CM

## 2019-01-11 DIAGNOSIS — E669 Obesity, unspecified: Secondary | ICD-10-CM

## 2019-01-11 DIAGNOSIS — R52 Pain, unspecified: Secondary | ICD-10-CM

## 2019-01-11 DIAGNOSIS — R69 Illness, unspecified: Secondary | ICD-10-CM | POA: Diagnosis not present

## 2019-01-11 DIAGNOSIS — G8929 Other chronic pain: Secondary | ICD-10-CM

## 2019-01-11 DIAGNOSIS — M5442 Lumbago with sciatica, left side: Secondary | ICD-10-CM | POA: Diagnosis not present

## 2019-01-11 MED ORDER — HYDROCODONE-ACETAMINOPHEN 10-325 MG PO TABS: 1.0000 | ORAL_TABLET | Freq: Three times a day (TID) | ORAL | 0 refills | Status: DC | PRN

## 2019-01-11 MED ORDER — HYDROCODONE-ACETAMINOPHEN 10-325 MG PO TABS: 1.0000 | ORAL_TABLET | Freq: Four times a day (QID) | ORAL | 0 refills | Status: DC | PRN

## 2019-01-11 NOTE — Progress Notes (Signed)
Subjective:    Patient ID: Allison Parker, female    DOB: August 01, 1941, 78 y.o.   MRN: [id number]  Chief Complaint  Patient presents with  . Medical Management of Chronic Issues  . pain management   Pt presents to the office today for chronic follow up and pain medication refillfor chronic back pain and thalamic pain syndrome. 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 PT states this is stable at this time. Pt is followed by Ortho annually for arthritis.  Hypertension  This is a chronic problem. The current episode started more than 1 year ago. The problem has been resolved since onset. The problem is controlled. Associated symptoms include anxiety and peripheral edema. Pertinent negatives include no headaches, malaise/fatigue or shortness of breath. Risk factors for coronary artery disease include dyslipidemia, diabetes mellitus, female gender and obesity. The current treatment provides moderate improvement. There is no history of kidney disease, CAD/MI or heart failure. Identifiable causes of hypertension include a thyroid problem.  Thyroid Problem  Presents for follow-up visit. Symptoms include anxiety, dry skin and tremors. Patient reports no constipation, diaphoresis or fatigue. The symptoms have been stable. Her past medical history is significant for hyperlipidemia. There is no history of heart failure.  Hyperlipidemia  This is a chronic problem. The current episode started more than 1 year ago. The problem is controlled. Recent lipid tests were reviewed and are normal. Exacerbating diseases include obesity. Pertinent negatives include no shortness of breath. Current antihyperlipidemic treatment includes statins. The current treatment provides moderate improvement of lipids. Risk factors for coronary artery disease include dyslipidemia, hypertension and a sedentary lifestyle.  Anxiety  Presents for follow-up visit. Symptoms include excessive worry, irritability and  nervous/anxious behavior. Patient reports no shortness of breath. Symptoms occur most days. The severity of symptoms is moderate.    Back Pain  This is a chronic problem. The current episode started more than 1 year ago. The problem occurs intermittently. The problem has been waxing and waning since onset. The pain is present in the lumbar spine. The quality of the pain is described as aching. The pain is at a severity of 8/10. The pain is moderate. Associated symptoms include weakness. Pertinent negatives include no headaches. She has tried analgesics and bed rest for the symptoms. The treatment provided mild relief.      Review of Systems  Constitutional: Positive for irritability. Negative for diaphoresis, fatigue and malaise/fatigue.  Respiratory: Negative for shortness of breath.   Gastrointestinal: Negative for constipation.  Musculoskeletal: Positive for back pain.  Neurological: Positive for tremors and weakness. Negative for headaches.  Psychiatric/Behavioral: The patient is nervous/anxious.   All other systems reviewed and are negative.      Objective:   Physical Exam Vitals signs reviewed.  Constitutional:      General: She is not in acute distress.    Appearance: She is well-developed.  HENT:     Head: Normocephalic and atraumatic.     Right Ear: Tympanic membrane normal.     Left Ear: Tympanic membrane normal.  Eyes:     Pupils: Pupils are equal, round, and reactive to light.  Neck:     Musculoskeletal: Normal range of motion and neck supple.     Thyroid: No thyromegaly.  Cardiovascular:     Rate and Rhythm: Normal rate and regular rhythm.     Heart sounds: Normal heart sounds. No murmur.  Pulmonary:     Effort: Pulmonary effort is normal.  No respiratory distress.     Breath sounds: Decreased breath sounds present. No wheezing.  Abdominal:     General: Bowel sounds are normal. There is no distension.     Palpations: Abdomen is soft.     Tenderness: There is no  abdominal tenderness.  Musculoskeletal:        General: No tenderness.     Comments: Right sided weakness, using cane to walk  Skin:    General: Skin is warm and dry.  Neurological:     Mental Status: She is alert and oriented to person, place, and time.     Cranial Nerves: No cranial nerve deficit.     Deep Tendon Reflexes: Reflexes are normal and symmetric.  Psychiatric:        Behavior: Behavior normal.        Thought Content: Thought content normal.        Judgment: Judgment normal.      BP 127/75   Pulse 70   Temp 97.9 F (36.6 C) (Oral)   Ht 5' 5"  (1.651 m)   Wt 199 lb 9.6 oz (90.5 kg)   BMI 33.22 kg/m      Assessment & Plan:  [name] comes in today with chief complaint of Medical Management of Chronic Issues and pain management   Diagnosis and orders addressed:  1. Essential hypertension, benign - CMP14+EGFR - CBC with Differential/Platelet  2. Hypothyroidism, unspecified type - CMP14+EGFR - CBC with Differential/Platelet - TSH  3. Thalamic pain syndrome - CMP14+EGFR - CBC with Differential/Platelet - HYDROcodone-acetaminophen (NORCO) 10-325 MG tablet; Take 1 tablet by mouth every 6 (six) hours as needed.  Dispense: 90 tablet; Refill: 0 - HYDROcodone-acetaminophen (NORCO) 10-325 MG tablet; Take 1 tablet by mouth every 8 (eight) hours as needed.  Dispense: 90 tablet; Refill: 0 - HYDROcodone-acetaminophen (NORCO) 10-325 MG tablet; Take 1 tablet by mouth every 8 (eight) hours as needed.  Dispense: 90 tablet; Refill: 0  4. Chronic bilateral low back pain with left-sided sciatica - CMP14+EGFR - CBC with Differential/Platelet - HYDROcodone-acetaminophen (NORCO) 10-325 MG tablet; Take 1 tablet by mouth every 6 (six) hours as needed.  Dispense: 90 tablet; Refill: 0 - HYDROcodone-acetaminophen (NORCO) 10-325 MG tablet; Take 1 tablet by mouth every 8 (eight) hours as needed.  Dispense: 90 tablet; Refill: 0 - HYDROcodone-acetaminophen (NORCO) 10-325 MG  tablet; Take 1 tablet by mouth every 8 (eight) hours as needed.  Dispense: 90 tablet; Refill: 0  5. Moderate episode of recurrent major depressive disorder (HCC) - CMP14+EGFR - CBC with Differential/Platelet  6. GAD (generalized anxiety disorder) - CMP14+EGFR - CBC with Differential/Platelet  7. Hyperlipidemia, unspecified hyperlipidemia type - CMP14+EGFR - CBC with Differential/Platelet - Lipid panel  8. Late effect of cerebrovascular accident - CMP14+EGFR - CBC with Differential/Platelet  9. Obesity (BMI 30-39.9) - CMP14+EGFR - CBC with Differential/Platelet  10. Uncomplicated opioid dependence (HCC) - CMP14+EGFR - CBC with Differential/Platelet - HYDROcodone-acetaminophen (NORCO) 10-325 MG tablet; Take 1 tablet by mouth every 6 (six) hours as needed.  Dispense: 90 tablet; Refill: 0 - HYDROcodone-acetaminophen (NORCO) 10-325 MG tablet; Take 1 tablet by mouth every 8 (eight) hours as needed.  Dispense: 90 tablet; Refill: 0 - HYDROcodone-acetaminophen (NORCO) 10-325 MG tablet; Take 1 tablet by mouth every 8 (eight) hours as needed.  Dispense: 90 tablet; Refill: 0  11. Pain medication agreement signed - CMP14+EGFR - CBC with Differential/Platelet - HYDROcodone-acetaminophen (NORCO) 10-325 MG tablet; Take 1 tablet by mouth every 6 (six) hours as needed.  Dispense:  90 tablet; Refill: 0 - HYDROcodone-acetaminophen (NORCO) 10-325 MG tablet; Take 1 tablet by mouth every 8 (eight) hours as needed.  Dispense: 90 tablet; Refill: 0 - HYDROcodone-acetaminophen (NORCO) 10-325 MG tablet; Take 1 tablet by mouth every 8 (eight) hours as needed.  Dispense: 90 tablet; Refill: 0  12. Pain management - CMP14+EGFR - CBC with Differential/Platelet - HYDROcodone-acetaminophen (NORCO) 10-325 MG tablet; Take 1 tablet by mouth every 6 (six) hours as needed.  Dispense: 90 tablet; Refill: 0 - HYDROcodone-acetaminophen (NORCO) 10-325 MG tablet; Take 1 tablet by mouth every 8 (eight) hours as needed.   Dispense: 90 tablet; Refill: 0 - HYDROcodone-acetaminophen (NORCO) 10-325 MG tablet; Take 1 tablet by mouth every 8 (eight) hours as needed.  Dispense: 90 tablet; Refill: 0   Labs pending Pt reviewed in [place] controlled database- No red flags noted Health Maintenance reviewed Diet and exercise encouraged  Follow up plan: 3 months    Evelina Dun, FNP

## 2019-01-11 NOTE — Patient Instructions (Signed)

## 2019-01-12 LAB — CMP14+EGFR
A/G RATIO: 2 (ref 1.2–2.2)
ALK PHOS: 117 IU/L (ref 39–117)
ALT: 19 IU/L (ref 0–32)
AST: 19 IU/L (ref 0–40)
Albumin: 4.4 g/dL (ref 3.7–4.7)
BILIRUBIN TOTAL: 0.4 mg/dL (ref 0.0–1.2)
BUN/Creatinine Ratio: 24 (ref 12–28)
BUN: 21 mg/dL (ref 8–27)
CHLORIDE: 97 mmol/L (ref 96–106)
CO2: 24 mmol/L (ref 20–29)
Calcium: 9.7 mg/dL (ref 8.7–10.3)
Creatinine, Ser: 0.87 mg/dL (ref 0.57–1.00)
GFR calc Af Amer: 75 mL/min/{1.73_m2} (ref >59)
GFR calc non Af Amer: 65 mL/min/{1.73_m2} (ref >59)
GLUCOSE: 83 mg/dL (ref 65–99)
Globulin, Total: 2.2 g/dL (ref 1.5–4.5)
POTASSIUM: 4.9 mmol/L (ref 3.5–5.2)
Sodium: 137 mmol/L (ref 134–144)
Total Protein: 6.6 g/dL (ref 6.0–8.5)

## 2019-01-12 LAB — CBC WITH DIFFERENTIAL/PLATELET
BASOS ABS: 0 10*3/uL (ref 0.0–0.2)
Basos: 1 %
EOS (ABSOLUTE): 0.2 10*3/uL (ref 0.0–0.4)
Eos: 3 %
HEMOGLOBIN: 13.2 g/dL (ref 11.1–15.9)
Hematocrit: 41.8 % (ref 34.0–46.6)
IMMATURE GRANS (ABS): 0 10*3/uL (ref 0.0–0.1)
Immature Granulocytes: 0 %
LYMPHS ABS: 2.1 10*3/uL (ref 0.7–3.1)
LYMPHS: 33 %
MCH: 29.1 pg (ref 26.6–33.0)
MCHC: 31.6 g/dL (ref 31.5–35.7)
MCV: 92 fL (ref 79–97)
MONOCYTES: 11 %
Monocytes Absolute: 0.7 10*3/uL (ref 0.1–0.9)
Neutrophils Absolute: 3.4 10*3/uL (ref 1.4–7.0)
Neutrophils: 52 %
Platelets: 265 10*3/uL (ref 150–450)
RBC: 4.53 x10E6/uL (ref 3.77–5.28)
RDW: 12.9 % (ref 11.7–15.4)
WBC: 6.5 10*3/uL (ref 3.4–10.8)

## 2019-01-12 LAB — LIPID PANEL
CHOL/HDL RATIO: 2.3 ratio (ref 0.0–4.4)
CHOLESTEROL TOTAL: 167 mg/dL (ref 100–199)
HDL: 74 mg/dL (ref >39)
LDL CALC: 71 mg/dL (ref 0–99)
TRIGLYCERIDES: 108 mg/dL (ref 0–149)
VLDL CHOLESTEROL CAL: 22 mg/dL (ref 5–40)

## 2019-01-12 LAB — TSH: TSH: 2.15 u[IU]/mL (ref 0.450–4.500)

## 2019-01-15 LAB — TOXASSURE SELECT 13 (MW), URINE

## 2019-01-22 ENCOUNTER — Other Ambulatory Visit: Payer: Self-pay | Admitting: Family

## 2019-01-22 DIAGNOSIS — F411 Generalized anxiety disorder: Secondary | ICD-10-CM

## 2019-01-22 DIAGNOSIS — F331 Major depressive disorder, recurrent, moderate: Secondary | ICD-10-CM

## 2019-03-07 ENCOUNTER — Other Ambulatory Visit: Payer: Self-pay | Admitting: Family

## 2019-03-07 DIAGNOSIS — E785 Hyperlipidemia, unspecified: Secondary | ICD-10-CM

## 2019-04-01 ENCOUNTER — Other Ambulatory Visit: Payer: Self-pay | Admitting: Family

## 2019-04-01 DIAGNOSIS — G8929 Other chronic pain: Secondary | ICD-10-CM

## 2019-04-01 DIAGNOSIS — G890 Central pain syndrome: Secondary | ICD-10-CM

## 2019-04-02 DIAGNOSIS — H35363 Drusen (degenerative) of macula, bilateral: Secondary | ICD-10-CM | POA: Diagnosis not present

## 2019-04-02 DIAGNOSIS — H35453 Secondary pigmentary degeneration, bilateral: Secondary | ICD-10-CM | POA: Diagnosis not present

## 2019-04-02 DIAGNOSIS — H53411 Scotoma involving central area, right eye: Secondary | ICD-10-CM | POA: Diagnosis not present

## 2019-04-02 DIAGNOSIS — H353121 Nonexudative age-related macular degeneration, left eye, early dry stage: Secondary | ICD-10-CM | POA: Diagnosis not present

## 2019-04-02 DIAGNOSIS — Z961 Presence of intraocular lens: Secondary | ICD-10-CM | POA: Diagnosis not present

## 2019-04-02 DIAGNOSIS — H353114 Nonexudative age-related macular degeneration, right eye, advanced atrophic with subfoveal involvement: Secondary | ICD-10-CM | POA: Diagnosis not present

## 2019-04-12 ENCOUNTER — Other Ambulatory Visit: Payer: Self-pay

## 2019-04-13 ENCOUNTER — Ambulatory Visit (INDEPENDENT_AMBULATORY_CARE_PROVIDER_SITE_OTHER): Payer: Medicare HMO | Admitting: Family

## 2019-04-13 ENCOUNTER — Encounter: Payer: Self-pay | Admitting: Family

## 2019-04-13 VITALS — BP 119/69 | HR 82 | Temp 98.8°F | Ht 65.0 in | Wt 203.4 lb

## 2019-04-13 DIAGNOSIS — F411 Generalized anxiety disorder: Secondary | ICD-10-CM

## 2019-04-13 DIAGNOSIS — G890 Central pain syndrome: Secondary | ICD-10-CM

## 2019-04-13 DIAGNOSIS — Z0289 Encounter for other administrative examinations: Secondary | ICD-10-CM

## 2019-04-13 DIAGNOSIS — F1120 Opioid dependence, uncomplicated: Secondary | ICD-10-CM

## 2019-04-13 DIAGNOSIS — I10 Essential (primary) hypertension: Secondary | ICD-10-CM

## 2019-04-13 DIAGNOSIS — I6930 Unspecified sequelae of cerebral infarction: Secondary | ICD-10-CM

## 2019-04-13 DIAGNOSIS — F331 Major depressive disorder, recurrent, moderate: Secondary | ICD-10-CM

## 2019-04-13 DIAGNOSIS — R52 Pain, unspecified: Secondary | ICD-10-CM

## 2019-04-13 DIAGNOSIS — N3281 Overactive bladder: Secondary | ICD-10-CM | POA: Diagnosis not present

## 2019-04-13 DIAGNOSIS — E039 Hypothyroidism, unspecified: Secondary | ICD-10-CM

## 2019-04-13 DIAGNOSIS — E669 Obesity, unspecified: Secondary | ICD-10-CM | POA: Diagnosis not present

## 2019-04-13 DIAGNOSIS — G8929 Other chronic pain: Secondary | ICD-10-CM

## 2019-04-13 DIAGNOSIS — M5442 Lumbago with sciatica, left side: Secondary | ICD-10-CM

## 2019-04-13 DIAGNOSIS — E785 Hyperlipidemia, unspecified: Secondary | ICD-10-CM

## 2019-04-13 DIAGNOSIS — R69 Illness, unspecified: Secondary | ICD-10-CM | POA: Diagnosis not present

## 2019-04-13 MED ORDER — HYDROCODONE-ACETAMINOPHEN 10-325 MG PO TABS: 1.0000 | ORAL_TABLET | Freq: Three times a day (TID) | ORAL | 0 refills | Status: DC | PRN

## 2019-04-13 MED ORDER — SOLIFENACIN SUCCINATE 5 MG PO TABS: 5.0000 mg | ORAL_TABLET | Freq: Every day | ORAL | 1 refills | Status: DC

## 2019-04-13 MED ORDER — HYDROCODONE-ACETAMINOPHEN 10-325 MG PO TABS: 1.0000 | ORAL_TABLET | Freq: Four times a day (QID) | ORAL | 0 refills | Status: DC | PRN

## 2019-04-13 NOTE — Progress Notes (Signed)
Subjective:    Patient ID: Allison Parker, female    DOB: 1941-01-17, 78 y.o.   MRN: [id number]  Chief Complaint  Patient presents with  . Medical Management of Chronic Issues    three month recheck   Pt presents to the office today for chronic follow up and pain medication refillfor chronic back pain and thalamic pain syndrome. 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 PT states this is stable at this time. Pt is followed by Ortho annually for arthritis. Hypertension This is a chronic problem. The current episode started more than 1 year ago. The problem has been resolved since onset. The problem is controlled. Associated symptoms include anxiety, malaise/fatigue and peripheral edema. Pertinent negatives include no shortness of breath. Risk factors for coronary artery disease include dyslipidemia, obesity and sedentary lifestyle. The current treatment provides moderate improvement. Hypertensive end-organ damage includes CVA. Identifiable causes of hypertension include a thyroid problem.  Thyroid Problem Presents for follow-up visit. Symptoms include anxiety and dry skin. Patient reports no constipation or depressed mood. The symptoms have been stable. Her past medical history is significant for hyperlipidemia.  Hyperlipidemia This is a chronic problem. The current episode started more than 1 year ago. The problem is controlled. Recent lipid tests were reviewed and are normal. Exacerbating diseases include obesity. Pertinent negatives include no shortness of breath. Current antihyperlipidemic treatment includes statins. The current treatment provides moderate improvement of lipids. Risk factors for coronary artery disease include diabetes mellitus, dyslipidemia, hypertension, a sedentary lifestyle and post-menopausal.  Anxiety Presents for follow-up visit. Symptoms include excessive worry, insomnia, irritability, nervous/anxious behavior, panic and restlessness.  Patient reports no depressed mood or shortness of breath. Symptoms occur occasionally. The quality of sleep is good.    Depression        This is a chronic problem.  The current episode started more than 1 year ago.   The onset quality is gradual.   The problem occurs intermittently.  Associated symptoms include insomnia, irritable, restlessness, decreased interest and sad.  Past treatments include SSRIs - Selective serotonin reuptake inhibitors.  Past medical history includes thyroid problem and anxiety.   Back Pain This is a chronic problem. The current episode started more than 1 year ago. The problem occurs intermittently. The problem has been waxing and waning since onset. The pain is present in the lumbar spine. The pain is at a severity of 8/10. The pain is moderate. She has tried bed rest and analgesics for the symptoms. The treatment provided mild relief.  [facility] PT states the myrbetriq helps, but can not afford this at this time. Complaining of urinary frequency.    Current opioids rx- Norco 10-325 mg every 8 hours as needed # meds rx- 90 Effectiveness of current meds-Stable Adverse reactions form pain meds-None Morphine equivalent- 26 mme/day  Pill count performed-No Last drug screen - 01/11/19 ( high risk q79m, moderate risk q81m, low risk yearly ) Urine drug screen today- No Was the [place] reviewed- Yes  If yes were their any concerning findings? - none  No flowsheet data found.   Pain contract signed on: 01/12/19   Review of Systems  Constitutional: Positive for irritability and malaise/fatigue.  Respiratory: Negative for shortness of breath.   Gastrointestinal: Negative for constipation.  Musculoskeletal: Positive for arthralgias and back pain.  Psychiatric/Behavioral: Positive for depression. The patient is nervous/anxious and has insomnia.   All other systems reviewed and are negative.  Objective:   Physical Exam Vitals signs reviewed.  Constitutional:       General: She is irritable. She is not in acute distress.    Appearance: She is well-developed.  HENT:     Head: Normocephalic and atraumatic.     Right Ear: Tympanic membrane normal.     Left Ear: Tympanic membrane normal.  Eyes:     Pupils: Pupils are equal, round, and reactive to light.  Neck:     Musculoskeletal: Normal range of motion and neck supple.     Thyroid: No thyromegaly.  Cardiovascular:     Rate and Rhythm: Normal rate and regular rhythm.     Heart sounds: Normal heart sounds. No murmur.  Pulmonary:     Effort: Pulmonary effort is normal. No respiratory distress.     Breath sounds: Normal breath sounds. No wheezing.  Abdominal:     General: Bowel sounds are normal. There is no distension.     Palpations: Abdomen is soft.     Tenderness: There is no abdominal tenderness.  Musculoskeletal:        General: Tenderness present.     Comments: Generalized weakness, using cane to walk, right side weakness present  Skin:    General: Skin is warm and dry.  Neurological:     Mental Status: She is alert and oriented to person, place, and time.     Cranial Nerves: No cranial nerve deficit.     Deep Tendon Reflexes: Reflexes are normal and symmetric.  Psychiatric:        Behavior: Behavior normal.        Thought Content: Thought content normal.        Judgment: Judgment normal.     BP 119/69   Pulse 82   Temp 98.8 F (37.1 C) (Oral)   Ht 5\' 5"  (1.651 m)   Wt 203 lb 6.4 oz (92.3 kg)   BMI 33.85 kg/m      Assessment & Plan:  Allison Parker comes in today with chief complaint of Medical Management of Chronic Issues (three month recheck)   Diagnosis and orders addressed:  1. Pain management - HYDROcodone-acetaminophen (NORCO) 10-325 MG tablet; Take 1 tablet by mouth every 6 (six) hours as needed.  Dispense: 90 tablet; Refill: 0 - HYDROcodone-acetaminophen (NORCO) 10-325 MG tablet; Take 1 tablet by mouth every 8 (eight) hours as needed.  Dispense: 90 tablet; Refill:  0 - HYDROcodone-acetaminophen (NORCO) 10-325 MG tablet; Take 1 tablet by mouth every 8 (eight) hours as needed.  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  Dispense: 90 tablet; Refill: 0 - HYDROcodone-acetaminophen (NORCO) 10-325 MG tablet; Take 1 tablet by mouth every 8 (eight) hours as needed.  Dispense: 90 tablet; Refill: 0 - HYDROcodone-acetaminophen (NORCO) 10-325 MG tablet; Take 1 tablet by mouth every 8 (eight) hours as needed.  Dispense: 90 tablet; Refill: 0  3. Thalamic pain syndrome - HYDROcodone-acetaminophen (NORCO) 10-325 MG tablet; Take 1 tablet by mouth every 6 (six) hours as needed.  Dispense: 90 tablet; Refill: 0 - HYDROcodone-acetaminophen (NORCO) 10-325 MG tablet; Take 1 tablet by mouth every 8 (eight) hours as needed.  Dispense: 90 tablet; Refill: 0 - HYDROcodone-acetaminophen (NORCO) 10-325 MG tablet; Take 1 tablet by mouth every 8 (eight) hours as needed.  Dispense: 90 tablet; Refill: 0  4. Pain medication agreement signed - HYDROcodone-acetaminophen ([place])  10-325 MG tablet; Take 1 tablet by mouth every 6 (six) hours as needed.  Dispense: 90 tablet; Refill: 0 - HYDROcodone-acetaminophen (NORCO) 10-325 MG tablet; Take 1 tablet by mouth every 8 (eight) hours as needed.  Dispense: 90 tablet; Refill: 0 - HYDROcodone-acetaminophen (NORCO) 10-325 MG tablet; Take 1 tablet by mouth every 8 (eight) hours as needed.  Dispense: 90 tablet; Refill: 0  5. Uncomplicated opioid dependence (HCC) - HYDROcodone-acetaminophen (NORCO) 10-325 MG tablet; Take 1 tablet by mouth every 6 (six) hours as needed.  Dispense: 90 tablet; Refill: 0 - HYDROcodone-acetaminophen (NORCO) 10-325 MG tablet; Take 1 tablet by mouth every 8 (eight) hours as needed.  Dispense: 90 tablet; Refill: 0 - HYDROcodone-acetaminophen (NORCO) 10-325 MG tablet; Take 1  tablet by mouth every 8 (eight) hours as needed.  Dispense: 90 tablet; Refill: 0  6. Essential hypertension, benign  7. Hypothyroidism, unspecified type  8. Overactive bladder -Will change myrbetriq to Vesicare today to see if coverage is better Avoid caffeine  - solifenacin (VESICARE) 5 MG tablet; Take 1 tablet (5 mg total) by mouth daily.  Dispense: 90 tablet; Refill: 1  9. Obesity (BMI 30-39.9)  10. Late effect of cerebrovascular accident  66. Hyperlipidemia, unspecified hyperlipidemia type  12. GAD (generalized anxiety disorder  13. Moderate episode of recurrent major depressive disorder ([place])   Labs reviewed from last visit, will get on next appt Health Maintenance reviewed Diet and exercise encouraged  Follow up plan: 3 months    Evelina Dun, FNP

## 2019-04-13 NOTE — Patient Instructions (Signed)

## 2019-04-23 ENCOUNTER — Other Ambulatory Visit: Payer: Self-pay | Admitting: Family

## 2019-04-23 DIAGNOSIS — F331 Major depressive disorder, recurrent, moderate: Secondary | ICD-10-CM

## 2019-04-23 DIAGNOSIS — F411 Generalized anxiety disorder: Secondary | ICD-10-CM

## 2019-05-11 ENCOUNTER — Ambulatory Visit (INDEPENDENT_AMBULATORY_CARE_PROVIDER_SITE_OTHER): Payer: Medicare HMO | Admitting: *Deleted

## 2019-05-11 VITALS — BP 130/70 | HR 70 | Ht 65.0 in | Wt 203.0 lb

## 2019-05-11 DIAGNOSIS — Z0000 Encounter for general adult medical examination without abnormal findings: Secondary | ICD-10-CM

## 2019-05-11 NOTE — Patient Instructions (Signed)
  Allison Parker , Thank you for taking time to come for your Medicare Wellness Visit. I appreciate your ongoing commitment to your health goals. Please review the following plan we discussed and let me know if I can assist you in the future.   These are the goals we discussed: Goals    . Weight (lb) < 180 lb (81.6 kg)     Stay active Try to prevent falls        This is a list of the screening recommended for you and due dates:  Health Maintenance  Topic Date Due  . DEXA scan (bone density measurement)  01/12/2017  . Flu Shot  05/26/2019  . Colon Cancer Screening  07/04/2023  . Tetanus Vaccine  06/11/2024  . Pneumonia vaccines  Completed

## 2019-05-11 NOTE — Progress Notes (Signed)
MEDICARE ANNUAL WELLNESS VISIT  05/11/2019  Telephone Visit Disclaimer This Medicare AWV was conducted by telephone due to national recommendations for restrictions regarding the COVID-19 Pandemic (e.g. social distancing).  I verified, using two identifiers, that I am speaking with Quintella Baton Schlup or their authorized healthcare agent. I discussed the limitations, risks, security, and privacy concerns of performing an evaluation and management service by telephone and the potential availability of an in-person appointment in the future. The patient expressed understanding and agreed to proceed.   Subjective:  Eeva Schlosser Huezo is a 78 y.o. female patient of Hawks, Theador Hawthorne, FNP who had a Medicare Annual Wellness Visit today via telephone. Tinita is Retired and lives with their spouse. she has 2 children, and 1 passed away. she reports that she is socially active and does interact with friends/family regularly. she is minimally physically active and enjoys genealogy.  Patient Care Team: Sharion Balloon, FNP as PCP - General (Nurse Practitioner) Buford Dresser, MD as PCP - Cardiology (Cardiology) Jalene Mullet, MD as Consulting Physician (Ophthalmology) Frederik Pear, MD as Consulting Physician (Orthopedic Surgery)  Advanced Directives 05/11/2019  Does Patient Have a Medical Advance Directive? No  Would patient like information on creating a medical advance directive? No - Patient declined    Hospital Utilization Over the Past 12 Months: # of hospitalizations or ER visits: 0 # of surgeries: 0  Review of Systems    Patient reports that her overall health is unchanged compared to last year.  Patient Reported Readings (BP, Pulse, CBG, Weight, etc) BP 130/70   Pulse 70   Ht _0  (1.651 m)   Wt 203 lb (92.1 kg)   BMI 33.78 kg/m    Review of Systems: General ROS: negative  All other systems negative.  Pain Assessment       Current Medications & Allergies (verified)  Allergies as of 05/11/2019      Reactions   Ace Inhibitors       Medication List       Accurate as of May 11, 2019 10:04 AM. If you have any questions, ask your nurse or doctor.        STOP taking these medications   metoprolol tartrate 50 MG tablet Commonly known as: LOPRESSOR     TAKE these medications   amLODipine 5 MG tablet Commonly known as: NORVASC Take 1 tablet (5 mg total) by mouth daily.   aspirin EC 81 MG tablet Take 1 tablet (81 mg total) by mouth daily.   atorvastatin 20 MG tablet Commonly known as: LIPITOR TAKE 1 TABLET BY MOUTH EVERY DAY   citalopram 40 MG tablet Commonly known as: CELEXA TAKE 1 TABLET BY MOUTH EVERY DAY   Co Q-10 300 MG Caps Take 1 capsule by mouth daily.   HYDROcodone-acetaminophen 10-325 MG tablet Commonly known as: NORCO Take 1 tablet by mouth every 6 (six) hours as needed.   HYDROcodone-acetaminophen 10-325 MG tablet Commonly known as: NORCO Take 1 tablet by mouth every 8 (eight) hours as needed.   HYDROcodone-acetaminophen 10-325 MG tablet Commonly known as: NORCO Take 1 tablet by mouth every 8 (eight) hours as needed.   levothyroxine 25 MCG tablet Commonly known as: SYNTHROID TAKE 1 TABLET BY MOUTH EVERY DAY   meloxicam 7.5 MG tablet Commonly known as: MOBIC TAKE 1 TABLET BY MOUTH EVERY DAY   multivitamin-lutein Caps capsule Take 1 capsule by mouth daily.   SM CALCIUM 500/VITAMIN D3 PO Take by mouth.   solifenacin  5 MG tablet Commonly known as: VESIcare Take 1 tablet (5 mg total) by mouth daily.   VITAMIN D-1000 MAX ST PO Take 0.5 tablets by mouth daily.       History (reviewed): Past Medical History:  Diagnosis Date  . Depression   . History of YAG laser capsulotomy of lens of left eye   . Hyperlipidemia   . Hypertension   . Insomnia   . Stroke ([place])   . Thyroid disease    hypothyroid   Past Surgical History:  Procedure Laterality Date  . BREAST SURGERY     lumpectomy  . CATARACT  EXTRACTION    . CHOLECYSTECTOMY    . EYE SURGERY     yag - bilateral, cataracts- bilateral  . Torn retina Left   . 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 Cancer Father        Lung  . Arthritis Father   . Dementia Mother   . Arthritis Mother   . Hypertension Brother   . Other Paternal Grandmother        eye tumor  . Cancer Paternal Grandfather        prostate  . Arthritis Brother   . Heart attack Brother    Social History   Socioeconomic History  . Marital status: Married    Spouse name: Jimmye Norman  . Number of children: 3  . Years of education: Not on file  . Highest education level: Not on file  Occupational History  . 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 Financial resource strain: Not on file  . Food insecurity    Worry: Not on file    Inability: Not on file  . Transportation needs    Medical: Not on file    Non-medical: Not on file  Tobacco Use  . Smoking status: Never Smoker  . Smokeless tobacco: Never Used  Substance and Sexual Activity  . Alcohol use: Yes  . Drug use: No  . Sexual activity: Not on file  Lifestyle  . Physical activity    Days per week: Not on file    Minutes per session: Not on file  . Stress: Not on file  Relationships  . Social Herbalist on phone: Not on file    Gets together: Not on file    Attends religious service: Not on file    Active member of club or organization: Not on file    Attends meetings of clubs or organizations: Not on file    Relationship status: Not on file  Other Topics Concern  . Not on file  Social History Narrative   Lives with husband. One child passed away    Activities of Daily Living In your present state of health, do you have any difficulty performing the following activities: 05/11/2019  Hearing? N  Vision? Y  Comment wear glasses / mac degeneration  Difficulty concentrating or making decisions?  N  Walking or climbing stairs? Y  Comment due to stroke  Dressing or bathing? N  Doing errands, shopping? N  Preparing Food and eating ? N  Using the Toilet? N  In the past six months, have you accidently leaked urine? N  Do you have problems with loss of bowel control? N  Managing your Medications? N  Managing your Finances? N  Housekeeping or managing your Housekeeping? N  Some recent data  might be hidden    Patient Education/ Literacy    Exercise Current Exercise Habits: The patient does not participate in regular exercise at present, Exercise limited by: neurologic condition(s)  Diet Patient reports consuming 3 meals a day and 0 snack(s) a day Patient reports that her primary diet is: Regular Patient reports that she does have regular access to food.   Depression Screen PHQ 2/9 Scores 05/11/2019 04/13/2019 01/11/2019 10/10/2018 07/11/2018 04/14/2018 01/09/2018  PHQ - 2 Score 0 0 0 0 0 0 0     Fall Risk Fall Risk  05/11/2019 04/13/2019 01/11/2019 10/10/2018 07/11/2018  Falls in the past year? 1 0 0 0 No  Number falls in past yr: 1 - - - -  Injury with Fall? 0 - - - -  Comment - - - - -  Risk Factor Category  - - - - -  Risk for fall due to : - - - - -  Risk for fall due to: Comment - - - - -  Follow up - - - - -     Objective:  Ether J Bord seemed alert and oriented and she participated appropriately during our telephone visit.  Blood Pressure Weight BMI  BP Readings from Last 3 Encounters:  05/11/19 130/70  04/13/19 119/69  01/11/19 127/75   Wt Readings from Last 3 Encounters:  05/11/19 203 lb (92.1 kg)  04/13/19 203 lb 6.4 oz (92.3 kg)  01/11/19 199 lb 9.6 oz (90.5 kg)   BMI Readings from Last 1 Encounters:  05/11/19 33.78 kg/m    *Unable to obtain current vital signs, weight, and BMI due to telephone visit type  Hearing/Vision  . Kamyla did not seem to have difficulty with hearing/understanding during the telephone conversation . Reports that she has had a  formal eye exam by an eye care professional within the past year . Reports that she has not had a formal hearing evaluation within the past year *Unable to fully assess hearing and vision during telephone visit type  Cognitive Function: 6CIT Screen 05/11/2019  What Year? 0 points  What month? 0 points  What time? 0 points  Count back from 20 0 points  Months in reverse 0 points  Repeat phrase 0 points  Total Score 0   (Normal:0-7, Significant for Dysfunction: >8)  Normal Cognitive Function Screening: Yes   Immunization & Health Maintenance Record Immunization History  Administered Date(s) Administered  . Influenza, High Dose Seasonal PF 10/11/2017, 10/10/2018  . Influenza,inj,Quad PF,6+ Mos 08/14/2013, 11/11/2014, 08/08/2015, 07/13/2016  . Pneumococcal Conjugate-13 07/13/2016  . Pneumococcal Polysaccharide-23 08/07/2012  . Tdap 09/25/2007, 06/11/2014    Health Maintenance  Topic Date Due  . DEXA SCAN  01/12/2017  . INFLUENZA VACCINE  05/26/2019  . COLONOSCOPY  07/04/2023  . TETANUS/TDAP  06/11/2024  . PNA vac Low Risk Adult  Completed       Assessment  This is a routine wellness examination for Vanissa Strength [place].  Health Maintenance: Due or Overdue Health Maintenance Due  Topic Date Due  . DEXA SCAN  01/12/2017    Quintella Baton [place] does not need a referral for [employer]: Care Management:   no Social Work:    no Prescription Assistance:  no Nutrition/Diabetes Education:  no   Plan:  Personalized Goals Goals Addressed            This Visit's Progress   . Weight (lb) < 180 lb (81.6 kg)   203 lb (92.1 kg)  Stay active Try to prevent falls       Personalized Health Maintenance & Screening Recommendations  up to date  Lung Cancer Screening Recommended: no (Low Dose CT Chest recommended if Age 27-80 years, 30 pack-year currently smoking OR have quit w/in past 15 years) Hepatitis C Screening recommended: no HIV Screening recommended: no   Advanced Directives: Written information was not prepared per patient's request.  Referrals & Orders No orders of the defined types were placed in this encounter.   Follow-up Plan . Follow-up with Sharion Balloon, FNP as planned    I have personally reviewed and noted the following in the patient's chart:   . Medical and social history . Use of alcohol, tobacco or illicit drugs  . Current medications and supplements . Functional ability and status . Nutritional status . Physical activity . Advanced directives . List of other physicians . Hospitalizations, surgeries, and ER visits in previous 12 months . Vitals . Screenings to include cognitive, depression, and falls . Referrals and appointments  In addition, I have reviewed and discussed with Quintella Baton Nop certain preventive protocols, quality metrics, and best practice recommendations. A written personalized care plan for preventive services as well as general preventive health recommendations is available and can be mailed to the patient at her request.      [place] Dec  05/11/2019

## 2019-05-23 ENCOUNTER — Other Ambulatory Visit: Payer: Self-pay | Admitting: Family

## 2019-05-23 DIAGNOSIS — G890 Central pain syndrome: Secondary | ICD-10-CM

## 2019-05-23 DIAGNOSIS — F411 Generalized anxiety disorder: Secondary | ICD-10-CM

## 2019-05-23 DIAGNOSIS — F331 Major depressive disorder, recurrent, moderate: Secondary | ICD-10-CM

## 2019-05-23 DIAGNOSIS — G8929 Other chronic pain: Secondary | ICD-10-CM

## 2019-05-23 NOTE — Telephone Encounter (Signed)
Next OV 07/16/19

## 2019-06-01 ENCOUNTER — Other Ambulatory Visit: Payer: Self-pay | Admitting: *Deleted

## 2019-06-01 DIAGNOSIS — F331 Major depressive disorder, recurrent, moderate: Secondary | ICD-10-CM

## 2019-06-01 DIAGNOSIS — F411 Generalized anxiety disorder: Secondary | ICD-10-CM

## 2019-06-04 ENCOUNTER — Ambulatory Visit (INDEPENDENT_AMBULATORY_CARE_PROVIDER_SITE_OTHER): Payer: Medicare HMO | Admitting: Family Medicine

## 2019-06-04 DIAGNOSIS — B029 Zoster without complications: Secondary | ICD-10-CM | POA: Diagnosis not present

## 2019-06-04 MED ORDER — GABAPENTIN 100 MG PO CAPS: ORAL_CAPSULE | ORAL | 3 refills | Status: DC

## 2019-06-04 MED ORDER — VALACYCLOVIR HCL 1 G PO TABS: 1000.0000 mg | ORAL_TABLET | Freq: Three times a day (TID) | ORAL | 0 refills | Status: AC

## 2019-06-04 NOTE — Progress Notes (Signed)
Telephone visit  Subjective: PY:[id number] PCP: Sharion Balloon, FNP ZTI:[id number] J Schwalbe is a 78 y.o. female calls for telephone consult today. Patient provides verbal consent for consult held via phone.  Location of patient: home Location of provider: Working remotely from home Others present for call: none  1. Shingles? Patient reports abrupt onset of rash under the left breast yesterday.  She notes that it is vesicular.  She initially thought this was a little yeast rash but is since spread to the posterior aspect of her left mid back.  It does not cross the midline.  She notes that it is painful.  She reports associated lethargy and headache.  No other contacts with similar rash but she is seen shingles in the past and feels that this looks identical.  No history of shingles vaccination.   ROS: Per HPI  Allergies  Allergen Reactions  . Ace Inhibitors    Past Medical History:  Diagnosis Date  . Depression   . History of YAG laser capsulotomy of lens of left eye   . Hyperlipidemia   . Hypertension   . Insomnia   . Stroke ([place])   . Thyroid disease    hypothyroid    Current Outpatient Medications:  .  amLODipine (NORVASC) 5 MG tablet, Take 1 tablet (5 mg total) by mouth daily., Disp: 90 tablet, Rfl: 3 .  aspirin EC 81 MG tablet, Take 1 tablet (81 mg total) by mouth daily., Disp: , Rfl:  .  atorvastatin (LIPITOR) 20 MG tablet, TAKE 1 TABLET BY MOUTH EVERY DAY, Disp: 90 tablet, Rfl: 1 .  Calcium Carbonate-Vitamin D (SM CALCIUM 500/VITAMIN D3 PO), Take by mouth., Disp: , Rfl:  .  Cholecalciferol (VITAMIN D-1000 MAX ST PO), Take 0.5 tablets by mouth daily., Disp: , Rfl:  .  citalopram (CELEXA) 40 MG tablet, TAKE 1 TABLET BY MOUTH EVERY DAY, Disp: 90 tablet, Rfl: 0 .  Coenzyme Q10 (CO Q-10) 300 MG CAPS, Take 1 capsule by mouth daily., Disp: , Rfl:  .  HYDROcodone-acetaminophen (NORCO) 10-325 MG tablet, Take 1 tablet by mouth every 6 (six) hours as needed., Disp: 90 tablet, Rfl: 0 .   HYDROcodone-acetaminophen (NORCO) 10-325 MG tablet, Take 1 tablet by mouth every 8 (eight) hours as needed. (Patient not taking: Reported on 05/11/2019), Disp: 90 tablet, Rfl: 0 .  HYDROcodone-acetaminophen (NORCO) 10-325 MG tablet, Take 1 tablet by mouth every 8 (eight) hours as needed. (Patient not taking: Reported on 05/11/2019), Disp: 90 tablet, Rfl: 0 .  levothyroxine (SYNTHROID, LEVOTHROID) 25 MCG tablet, TAKE 1 TABLET BY MOUTH EVERY DAY, Disp: 90 tablet, Rfl: 2 .  meloxicam (MOBIC) 7.5 MG tablet, TAKE 1 TABLET BY MOUTH EVERY DAY, Disp: 90 tablet, Rfl: 0 .  multivitamin-lutein (OCUVITE-LUTEIN) CAPS, Take 1 capsule by mouth daily., Disp: , Rfl:  .  solifenacin (VESICARE) 5 MG tablet, Take 1 tablet (5 mg total) by mouth daily., Disp: 90 tablet, Rfl: 1  Assessment/ Plan: 78 y.o. female   1. Herpes zoster without complication Description of rash is very suggestive of herpes zoster.  We discussed home care instructions including covering the lesion and avoiding people with low immune systems.  I have sent in Valtrex given onset less than 24 hours ago.  I have also sent in gabapentin for neuropathic pain.  I recommended follow-up with PCP in the next couple of months to evaluate for ability to taper off of gabapentin.  We also discussed signs and symptoms concerning for secondary bacterial infection.  She  will follow-up PRN. - valACYclovir (VALTREX) 1000 MG tablet; Take 1 tablet (1,000 mg total) by mouth 3 (three) times daily for 7 days.  Dispense: 21 tablet; Refill: 0 - gabapentin (NEURONTIN) 100 MG capsule; Take 300mg  by mouth day 1. Then 300mg  BID day 2. Then 300mg  capsule TID.  Dispense: 90 capsule; Refill: 3   Start time: 8:58am End time: 9:03am  Total time spent on patient care (including telephone call/ virtual visit): 15 minutes  [place], [place] [phone]

## 2019-06-04 NOTE — Patient Instructions (Signed)
Shingles  Shingles, which is also known as herpes zoster, is an infection that causes a painful skin rash and fluid-filled blisters. It is caused by a virus. Shingles only develops in people who:  Have had chickenpox.  Have been given a medicine to protect against chickenpox (have been vaccinated). Shingles is rare in this group. What are the causes? Shingles is caused by varicella-zoster virus (VZV). This is the same virus that causes chickenpox. After a person is exposed to VZV, the virus stays in the body in an inactive (dormant) state. Shingles develops if the virus is reactivated. This can happen many years after the first (initial) exposure to VZV. It is not known what causes this virus to be reactivated. What increases the risk? People who have had chickenpox or received the chickenpox vaccine are at risk for shingles. Shingles infection is more common in people who:  Are older than age 60.  Have a weakened disease-fighting system (immune system), such as people with: ? HIV. ? AIDS. ? Cancer.  Are taking medicines that weaken the immune system, such as transplant medicines.  Are experiencing a lot of stress. What are the signs or symptoms? Early symptoms of this condition include itching, tingling, and pain in an area on your skin. Pain may be described as burning, stabbing, or throbbing. A few days or weeks after early symptoms start, a painful red rash appears. The rash is usually on one side of the body and has a band-like or belt-like pattern. The rash eventually turns into fluid-filled blisters that break open, change into scabs, and dry up in about 2-3 weeks. At any time during the infection, you may also develop:  A fever.  Chills.  A headache.  An upset stomach. How is this diagnosed? This condition is diagnosed with a skin exam. Skin or fluid samples may be taken from the blisters before a diagnosis is made. These samples are examined under a microscope or sent to  a lab for testing. How is this treated? The rash may last for several weeks. There is not a specific cure for this condition. Your health care provider will probably prescribe medicines to help you manage pain, recover more quickly, and avoid long-term problems. Medicines may include:  Antiviral drugs.  Anti-inflammatory drugs.  Pain medicines.  Anti-itching medicines (antihistamines). If the area involved is on your face, you may be referred to a specialist, such as an eye doctor (ophthalmologist) or an ear, nose, and throat (ENT) doctor (otolaryngologist) to help you avoid eye problems, chronic pain, or disability. Follow these instructions at home: Medicines  Take over-the-counter and prescription medicines only as told by your health care provider.  Apply an anti-itch cream or numbing cream to the affected area as told by your health care provider. Relieving itching and discomfort   Apply cold, wet cloths (cold compresses) to the area of the rash or blisters as told by your health care provider.  Cool baths can be soothing. Try adding baking soda or dry oatmeal to the water to reduce itching. Do not bathe in hot water. Blister and rash care  Keep your rash covered with a loose bandage (dressing). Wear loose-fitting clothing to help ease the pain of material rubbing against the rash.  Keep your rash and blisters clean by washing the area with mild soap and cool water as told by your health care provider.  Check your rash every day for signs of infection. Check for: ? More redness, swelling, or pain. ? Fluid   or blood. ? Warmth. ? Pus or a bad smell.  Do not scratch your rash or pick at your blisters. To help avoid scratching: ? Keep your fingernails clean and cut short. ? Wear gloves or mittens while you sleep, if scratching is a problem. General instructions  Rest as told by your health care provider.  Keep all follow-up visits as told by your health care provider. This  is important.  Wash your hands often with soap and water. If soap and water are not available, use hand sanitizer. Doing this lowers your chance of getting a bacterial skin infection.  Before your blisters change into scabs, your shingles infection can cause chickenpox in people who have never had it or have never been vaccinated against it. To prevent this from happening, avoid contact with other people, especially: ? Babies. ? Pregnant women. ? Children who have eczema. ? Elderly people who have transplants. ? People who have chronic illnesses, such as cancer or AIDS. Contact a health care provider if:  Your pain is not relieved with prescribed medicines.  Your pain does not get better after the rash heals.  You have signs of infection in the rash area, such as: ? More redness, swelling, or pain around the rash. ? Fluid or blood coming from the rash. ? The rash area feeling warm to the touch. ? Pus or a bad smell coming from the rash. Get help right away if:  The rash is on your face or nose.  You have facial pain, pain around your eye area, or loss of feeling on one side of your face.  You have difficulty seeing.  You have ear pain or have ringing in your ear.  You have a loss of taste.  Your condition gets worse. Summary  Shingles, which is also known as herpes zoster, is an infection that causes a painful skin rash and fluid-filled blisters.  This condition is diagnosed with a skin exam. Skin or fluid samples may be taken from the blisters and examined before the diagnosis is made.  Keep your rash covered with a loose bandage (dressing). Wear loose-fitting clothing to help ease the pain of material rubbing against the rash.  Before your blisters change into scabs, your shingles infection can cause chickenpox in people who have never had it or have never been vaccinated against it. This information is not intended to replace advice given to you by your health care  provider. Make sure you discuss any questions you have with your health care provider. Document Released: 10/11/2005 Document Revised: 02/02/2019 Document Reviewed: 06/15/2017 Elsevier Patient Education  2020 [employer]  

## 2019-06-18 ENCOUNTER — Telehealth: Payer: Self-pay | Admitting: Family

## 2019-06-18 NOTE — Telephone Encounter (Signed)
It looks like she was given the gabapentin, is she taking that 3 times a day as recommended, if not start taking that, if she is taking that than she needs to discuss this further with Alyse Low when she is back tomorrow.

## 2019-06-18 NOTE — Telephone Encounter (Signed)
Patient has not started on Gabapentin because she was concerned it would interfere with her pain contract.  I advised patient that Dr. Warrick Parisian recommended she take the Gabapentin.  I contacted [facility] in [place] to make sure they still had this medication on file and they do.  The will get the prescription ready and patient will pick up prescription and begin medication.

## 2019-07-12 ENCOUNTER — Other Ambulatory Visit: Payer: Self-pay | Admitting: Family

## 2019-07-12 DIAGNOSIS — I10 Essential (primary) hypertension: Secondary | ICD-10-CM

## 2019-07-13 ENCOUNTER — Other Ambulatory Visit: Payer: Self-pay

## 2019-07-16 ENCOUNTER — Ambulatory Visit (INDEPENDENT_AMBULATORY_CARE_PROVIDER_SITE_OTHER): Payer: Medicare HMO | Admitting: Family

## 2019-07-16 ENCOUNTER — Encounter: Payer: Self-pay | Admitting: Family

## 2019-07-16 ENCOUNTER — Other Ambulatory Visit: Payer: Self-pay

## 2019-07-16 ENCOUNTER — Other Ambulatory Visit: Payer: Self-pay | Admitting: Family

## 2019-07-16 VITALS — BP 128/75 | HR 81 | Temp 97.1°F | Resp 20 | Ht 65.0 in | Wt 200.0 lb

## 2019-07-16 DIAGNOSIS — E785 Hyperlipidemia, unspecified: Secondary | ICD-10-CM

## 2019-07-16 DIAGNOSIS — E559 Vitamin D deficiency, unspecified: Secondary | ICD-10-CM

## 2019-07-16 DIAGNOSIS — B029 Zoster without complications: Secondary | ICD-10-CM

## 2019-07-16 DIAGNOSIS — I10 Essential (primary) hypertension: Secondary | ICD-10-CM | POA: Diagnosis not present

## 2019-07-16 DIAGNOSIS — G8929 Other chronic pain: Secondary | ICD-10-CM

## 2019-07-16 DIAGNOSIS — M5442 Lumbago with sciatica, left side: Secondary | ICD-10-CM

## 2019-07-16 DIAGNOSIS — G4700 Insomnia, unspecified: Secondary | ICD-10-CM | POA: Diagnosis not present

## 2019-07-16 DIAGNOSIS — G890 Central pain syndrome: Secondary | ICD-10-CM

## 2019-07-16 DIAGNOSIS — I6930 Unspecified sequelae of cerebral infarction: Secondary | ICD-10-CM | POA: Diagnosis not present

## 2019-07-16 DIAGNOSIS — R52 Pain, unspecified: Secondary | ICD-10-CM

## 2019-07-16 DIAGNOSIS — Z0289 Encounter for other administrative examinations: Secondary | ICD-10-CM | POA: Diagnosis not present

## 2019-07-16 DIAGNOSIS — E039 Hypothyroidism, unspecified: Secondary | ICD-10-CM | POA: Diagnosis not present

## 2019-07-16 DIAGNOSIS — F1120 Opioid dependence, uncomplicated: Secondary | ICD-10-CM

## 2019-07-16 DIAGNOSIS — E669 Obesity, unspecified: Secondary | ICD-10-CM

## 2019-07-16 DIAGNOSIS — R69 Illness, unspecified: Secondary | ICD-10-CM | POA: Diagnosis not present

## 2019-07-16 DIAGNOSIS — F411 Generalized anxiety disorder: Secondary | ICD-10-CM

## 2019-07-16 DIAGNOSIS — F331 Major depressive disorder, recurrent, moderate: Secondary | ICD-10-CM

## 2019-07-16 MED ORDER — HYDROCODONE-ACETAMINOPHEN 10-325 MG PO TABS: 1.0000 | ORAL_TABLET | Freq: Four times a day (QID) | ORAL | 0 refills | Status: DC | PRN

## 2019-07-16 MED ORDER — HYDROCODONE-ACETAMINOPHEN 10-325 MG PO TABS: 1.0000 | ORAL_TABLET | Freq: Three times a day (TID) | ORAL | 0 refills | Status: DC | PRN

## 2019-07-16 NOTE — Patient Instructions (Signed)
Shingles  Shingles, which is also known as herpes zoster, is an infection that causes a painful skin rash and fluid-filled blisters. It is caused by a virus. Shingles only develops in people who:  Have had chickenpox.  Have been given a medicine to protect against chickenpox (have been vaccinated). Shingles is rare in this group. What are the causes? Shingles is caused by varicella-zoster virus (VZV). This is the same virus that causes chickenpox. After a person is exposed to VZV, the virus stays in the body in an inactive (dormant) state. Shingles develops if the virus is reactivated. This can happen many years after the first (initial) exposure to VZV. It is not known what causes this virus to be reactivated. What increases the risk? People who have had chickenpox or received the chickenpox vaccine are at risk for shingles. Shingles infection is more common in people who:  Are older than age 60.  Have a weakened disease-fighting system (immune system), such as people with: ? HIV. ? AIDS. ? Cancer.  Are taking medicines that weaken the immune system, such as transplant medicines.  Are experiencing a lot of stress. What are the signs or symptoms? Early symptoms of this condition include itching, tingling, and pain in an area on your skin. Pain may be described as burning, stabbing, or throbbing. A few days or weeks after early symptoms start, a painful red rash appears. The rash is usually on one side of the body and has a band-like or belt-like pattern. The rash eventually turns into fluid-filled blisters that break open, change into scabs, and dry up in about 2-3 weeks. At any time during the infection, you may also develop:  A fever.  Chills.  A headache.  An upset stomach. How is this diagnosed? This condition is diagnosed with a skin exam. Skin or fluid samples may be taken from the blisters before a diagnosis is made. These samples are examined under a microscope or sent to  a lab for testing. How is this treated? The rash may last for several weeks. There is not a specific cure for this condition. Your health care provider will probably prescribe medicines to help you manage pain, recover more quickly, and avoid long-term problems. Medicines may include:  Antiviral drugs.  Anti-inflammatory drugs.  Pain medicines.  Anti-itching medicines (antihistamines). If the area involved is on your face, you may be referred to a specialist, such as an eye doctor (ophthalmologist) or an ear, nose, and throat (ENT) doctor (otolaryngologist) to help you avoid eye problems, chronic pain, or disability. Follow these instructions at home: Medicines  Take over-the-counter and prescription medicines only as told by your health care provider.  Apply an anti-itch cream or numbing cream to the affected area as told by your health care provider. Relieving itching and discomfort   Apply cold, wet cloths (cold compresses) to the area of the rash or blisters as told by your health care provider.  Cool baths can be soothing. Try adding baking soda or dry oatmeal to the water to reduce itching. Do not bathe in hot water. Blister and rash care  Keep your rash covered with a loose bandage (dressing). Wear loose-fitting clothing to help ease the pain of material rubbing against the rash.  Keep your rash and blisters clean by washing the area with mild soap and cool water as told by your health care provider.  Check your rash every day for signs of infection. Check for: ? More redness, swelling, or pain. ? Fluid   or blood. ? Warmth. ? Pus or a bad smell.  Do not scratch your rash or pick at your blisters. To help avoid scratching: ? Keep your fingernails clean and cut short. ? Wear gloves or mittens while you sleep, if scratching is a problem. General instructions  Rest as told by your health care provider.  Keep all follow-up visits as told by your health care provider. This  is important.  Wash your hands often with soap and water. If soap and water are not available, use hand sanitizer. Doing this lowers your chance of getting a bacterial skin infection.  Before your blisters change into scabs, your shingles infection can cause chickenpox in people who have never had it or have never been vaccinated against it. To prevent this from happening, avoid contact with other people, especially: ? Babies. ? Pregnant women. ? Children who have eczema. ? Elderly people who have transplants. ? People who have chronic illnesses, such as cancer or AIDS. Contact a health care provider if:  Your pain is not relieved with prescribed medicines.  Your pain does not get better after the rash heals.  You have signs of infection in the rash area, such as: ? More redness, swelling, or pain around the rash. ? Fluid or blood coming from the rash. ? The rash area feeling warm to the touch. ? Pus or a bad smell coming from the rash. Get help right away if:  The rash is on your face or nose.  You have facial pain, pain around your eye area, or loss of feeling on one side of your face.  You have difficulty seeing.  You have ear pain or have ringing in your ear.  You have a loss of taste.  Your condition gets worse. Summary  Shingles, which is also known as herpes zoster, is an infection that causes a painful skin rash and fluid-filled blisters.  This condition is diagnosed with a skin exam. Skin or fluid samples may be taken from the blisters and examined before the diagnosis is made.  Keep your rash covered with a loose bandage (dressing). Wear loose-fitting clothing to help ease the pain of material rubbing against the rash.  Before your blisters change into scabs, your shingles infection can cause chickenpox in people who have never had it or have never been vaccinated against it. This information is not intended to replace advice given to you by your health care  provider. Make sure you discuss any questions you have with your health care provider. Document Released: 10/11/2005 Document Revised: 02/02/2019 Document Reviewed: 06/15/2017 Elsevier Patient Education  2020 [employer]  

## 2019-07-16 NOTE — Progress Notes (Signed)
Subjective:    Patient ID: Allison Parker, female    DOB: August 10, 1941, 78 y.o.   MRN: [id number]  No chief complaint on file.  Pt presents to the office today for chronic follow up and pain medication refillfor chronic back pain and thalamic pain syndrome. 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 PT states this is stable at this time. Pt is followed by Ortho annually for arthritis.  She states she got shingles about 7 weeks ago. She is currently taking Gabapentin 100 mg as needed. She states the pain is intermittent stabbing pain of 8 out 10.  Hypertension This is a chronic problem. The current episode started more than 1 year ago. The problem has been resolved since onset. The problem is controlled. Associated symptoms include anxiety and malaise/fatigue. Pertinent negatives include no peripheral edema or shortness of breath. Risk factors for coronary artery disease include sedentary lifestyle and dyslipidemia. The current treatment provides moderate improvement. There is no history of kidney disease, CAD/MI or heart failure. Identifiable causes of hypertension include a thyroid problem.  Thyroid Problem Presents for follow-up visit. Symptoms include anxiety, dry skin and fatigue. Patient reports no constipation, depressed mood or diarrhea. The symptoms have been stable. Her past medical history is significant for hyperlipidemia. There is no history of heart failure.  Back Pain The current episode started more than 1 year ago. The problem occurs intermittently. The problem has been waxing and waning since onset. The pain is present in the lumbar spine. The pain is at a severity of 7/10. The pain is moderate. Associated symptoms include weakness. Pertinent negatives include no bladder incontinence or bowel incontinence. Risk factors include poor posture. She has tried analgesics for the symptoms. The treatment provided moderate relief.  Depression        This is a  chronic problem.  The current episode started more than 1 year ago.   The onset quality is gradual.   The problem occurs intermittently.  The problem has been waxing and waning since onset.  Associated symptoms include decreased concentration, fatigue and insomnia.  Past treatments include SSRIs - Selective serotonin reuptake inhibitors.  Compliance with treatment is good.  Past medical history includes thyroid problem and anxiety.   Anxiety Presents for follow-up visit. Symptoms include decreased concentration, excessive worry, insomnia and nervous/anxious behavior. Patient reports no depressed mood or shortness of breath. Symptoms occur most days. The severity of symptoms is moderate. The quality of sleep is good.    Hyperlipidemia This is a chronic problem. The current episode started more than 1 year ago. Exacerbating diseases include obesity. Pertinent negatives include no shortness of breath. Current antihyperlipidemic treatment includes statins. The current treatment provides moderate improvement of lipids. Risk factors for coronary artery disease include dyslipidemia, hypertension, a sedentary lifestyle and post-menopausal.  Insomnia Primary symptoms: difficulty falling asleep, frequent awakening, malaise/fatigue.  The current episode started more than one year. The onset quality is gradual. The problem occurs intermittently. The problem has been waxing and waning since onset. PMH includes: depression.  Urinary Frequency  This is a chronic problem. The current episode started more than 1 year ago. There has been no fever. Associated symptoms include frequency. Associated symptoms comments: Wakes up 2-3 times a night.      Review of Systems  Constitutional: Positive for fatigue and malaise/fatigue.  Respiratory: Negative for shortness of breath.   Gastrointestinal: Negative for bowel incontinence, constipation and diarrhea.  Genitourinary: Positive for frequency.  Negative for bladder  incontinence.  Musculoskeletal: Positive for back pain.  Neurological: Positive for weakness.  Psychiatric/Behavioral: Positive for decreased concentration and depression. The patient is nervous/anxious and has insomnia.   All other systems reviewed and are negative.      Objective:   Physical Exam Vitals signs reviewed.  Constitutional:      General: She is not in acute distress.    Appearance: She is well-developed.  HENT:     Head: Normocephalic and atraumatic.     Right Ear: Tympanic membrane normal.     Left Ear: Tympanic membrane normal.  Eyes:     Pupils: Pupils are equal, round, and reactive to light.  Neck:     Musculoskeletal: Normal range of motion and neck supple.     Thyroid: No thyromegaly.  Cardiovascular:     Rate and Rhythm: Normal rate and regular rhythm.     Heart sounds: Normal heart sounds. No murmur.  Pulmonary:     Effort: Pulmonary effort is normal. No respiratory distress.     Breath sounds: Normal breath sounds. No wheezing.  Abdominal:     General: Bowel sounds are normal. There is no distension.     Palpations: Abdomen is soft.     Tenderness: There is no abdominal tenderness.  Musculoskeletal: Normal range of motion.        General: No tenderness.     Right lower leg: Edema (trace) present.  Skin:    General: Skin is warm and dry.  Neurological:     Mental Status: She is alert and oriented to person, place, and time.     Cranial Nerves: No cranial nerve deficit.     Motor: Weakness present.     Gait: Gait abnormal.     Deep Tendon Reflexes: Reflexes are normal and symmetric.     Comments: Mild right sided weakness, using cane, essential tremor present  Psychiatric:        Behavior: Behavior normal.        Thought Content: Thought content normal.        Judgment: Judgment normal.          BP 128/75   Pulse 81   Temp (!) 97.1 F (36.2 C) (Temporal)   Resp 20   Ht 5\' 5"  (1.651 m)   Wt 200 lb (90.7 kg)   SpO2 99%   BMI 33.28  kg/m   Assessment & Plan:  Minela Alkhafaji Hanigan comes in today with chief complaint of Medical Management of Chronic Issues   Diagnosis and orders addressed:  1. Essential hypertension, benign  2. Hypothyroidism, unspecified type  3. Thalamic pain syndrome - HYDROcodone-acetaminophen (NORCO) 10-325 MG tablet; Take 1 tablet by mouth every 6 (six) hours as needed.  Dispense: 90 tablet; Refill: 0 - HYDROcodone-acetaminophen (NORCO) 10-325 MG tablet; Take 1 tablet by mouth every 8 (eight) hours as needed.  Dispense: 90 tablet; Refill: 0 - HYDROcodone-acetaminophen (NORCO) 10-325 MG tablet; Take 1 tablet by mouth every 8 (eight) hours as needed.  Dispense: 90 tablet; Refill: 0  4. Vitamin D deficiency  5. Pain medication agreement signed - HYDROcodone-acetaminophen (NORCO) 10-325 MG tablet; Take 1 tablet by mouth every 6 (six) hours as needed.  Dispense: 90 tablet; Refill: 0 - HYDROcodone-acetaminophen (NORCO) 10-325 MG tablet; Take 1 tablet by mouth every 8 (eight) hours as needed.  Dispense: 90 tablet; Refill: 0 - HYDROcodone-acetaminophen (NORCO) 10-325 MG tablet; Take 1 tablet by mouth every 8 (eight) hours as needed.  Dispense: 90  tablet; Refill: 0  6. Uncomplicated opioid dependence (HCC) - HYDROcodone-acetaminophen (NORCO) 10-325 MG tablet; Take 1 tablet by mouth every 6 (six) hours as needed.  Dispense: 90 tablet; Refill: 0 - HYDROcodone-acetaminophen (NORCO) 10-325 MG tablet; Take 1 tablet by mouth every 8 (eight) hours as needed.  Dispense: 90 tablet; Refill: 0 - HYDROcodone-acetaminophen (NORCO) 10-325 MG tablet; Take 1 tablet by mouth every 8 (eight) hours as needed.  Dispense: 90 tablet; Refill: 0  7. Obesity (BMI 30-39.9)  8. Late effect of cerebrovascular accident  41. Insomnia, unspecified type  10. Hyperlipidemia, unspecified hyperlipidemia type  11. GAD (generalized anxiety disorder)  12. Moderate episode of recurrent major depressive disorder ([place])   13. Chronic  bilateral low back pain with left-sided sciatica - HYDROcodone-acetaminophen (NORCO) 10-325 MG tablet; Take 1 tablet by mouth every 6 (six) hours as needed.  Dispense: 90 tablet; Refill: 0 - HYDROcodone-acetaminophen (NORCO) 10-325 MG tablet; Take 1 tablet by mouth every 8 (eight) hours as needed.  Dispense: 90 tablet; Refill: 0 - HYDROcodone-acetaminophen (NORCO) 10-325 MG tablet; Take 1 tablet by mouth every 8 (eight) hours as needed.  Dispense: 90 tablet; Refill: 0  14. Pain management  - HYDROcodone-acetaminophen (NORCO) 10-325 MG tablet; Take 1 tablet by mouth every 6 (six) hours as needed.  Dispense: 90 tablet; Refill: 0 - HYDROcodone-acetaminophen (NORCO) 10-325 MG tablet; Take 1 tablet by mouth every 8 (eight) hours as needed.  Dispense: 90 tablet; Refill: 0 - HYDROcodone-acetaminophen (NORCO) 10-325 MG tablet; Take 1 tablet by mouth every 8 (eight) hours as needed.  Dispense: 90 tablet; Refill: 0  15. Herpes zoster without complication Continues gabapentin as needed    Labs reviewed- will do on next visit Pt reviewed in [place] controlled database, no red flags  Health Maintenance reviewed Diet and exercise encouraged  Follow up plan: 3 months    Evelina Dun, FNP

## 2019-08-06 ENCOUNTER — Other Ambulatory Visit: Payer: Self-pay | Admitting: Family

## 2019-08-06 DIAGNOSIS — F411 Generalized anxiety disorder: Secondary | ICD-10-CM

## 2019-08-06 DIAGNOSIS — F331 Major depressive disorder, recurrent, moderate: Secondary | ICD-10-CM

## 2019-08-16 DIAGNOSIS — R69 Illness, unspecified: Secondary | ICD-10-CM | POA: Diagnosis not present

## 2019-09-04 ENCOUNTER — Other Ambulatory Visit: Payer: Self-pay | Admitting: Family

## 2019-09-04 DIAGNOSIS — G8929 Other chronic pain: Secondary | ICD-10-CM

## 2019-09-04 DIAGNOSIS — M5442 Lumbago with sciatica, left side: Secondary | ICD-10-CM

## 2019-09-04 DIAGNOSIS — G890 Central pain syndrome: Secondary | ICD-10-CM

## 2019-09-08 ENCOUNTER — Other Ambulatory Visit: Payer: Self-pay | Admitting: Family

## 2019-09-08 DIAGNOSIS — E785 Hyperlipidemia, unspecified: Secondary | ICD-10-CM

## 2019-10-01 DIAGNOSIS — H53411 Scotoma involving central area, right eye: Secondary | ICD-10-CM | POA: Diagnosis not present

## 2019-10-01 DIAGNOSIS — H35453 Secondary pigmentary degeneration, bilateral: Secondary | ICD-10-CM | POA: Diagnosis not present

## 2019-10-01 DIAGNOSIS — H35363 Drusen (degenerative) of macula, bilateral: Secondary | ICD-10-CM | POA: Diagnosis not present

## 2019-10-01 DIAGNOSIS — H5319 Other subjective visual disturbances: Secondary | ICD-10-CM | POA: Diagnosis not present

## 2019-10-01 DIAGNOSIS — H353114 Nonexudative age-related macular degeneration, right eye, advanced atrophic with subfoveal involvement: Secondary | ICD-10-CM | POA: Diagnosis not present

## 2019-10-01 DIAGNOSIS — Z961 Presence of intraocular lens: Secondary | ICD-10-CM | POA: Diagnosis not present

## 2019-10-01 DIAGNOSIS — H353122 Nonexudative age-related macular degeneration, left eye, intermediate dry stage: Secondary | ICD-10-CM | POA: Diagnosis not present

## 2019-10-09 ENCOUNTER — Other Ambulatory Visit: Payer: Self-pay | Admitting: Family

## 2019-10-09 DIAGNOSIS — N3281 Overactive bladder: Secondary | ICD-10-CM

## 2019-10-16 ENCOUNTER — Encounter: Payer: Self-pay | Admitting: Family

## 2019-10-16 ENCOUNTER — Ambulatory Visit (INDEPENDENT_AMBULATORY_CARE_PROVIDER_SITE_OTHER): Payer: Medicare HMO | Admitting: Family

## 2019-10-16 DIAGNOSIS — N3281 Overactive bladder: Secondary | ICD-10-CM | POA: Diagnosis not present

## 2019-10-16 DIAGNOSIS — F331 Major depressive disorder, recurrent, moderate: Secondary | ICD-10-CM

## 2019-10-16 DIAGNOSIS — G890 Central pain syndrome: Secondary | ICD-10-CM

## 2019-10-16 DIAGNOSIS — Z0289 Encounter for other administrative examinations: Secondary | ICD-10-CM | POA: Diagnosis not present

## 2019-10-16 DIAGNOSIS — R69 Illness, unspecified: Secondary | ICD-10-CM | POA: Diagnosis not present

## 2019-10-16 DIAGNOSIS — F1120 Opioid dependence, uncomplicated: Secondary | ICD-10-CM

## 2019-10-16 DIAGNOSIS — F411 Generalized anxiety disorder: Secondary | ICD-10-CM

## 2019-10-16 DIAGNOSIS — M5442 Lumbago with sciatica, left side: Secondary | ICD-10-CM

## 2019-10-16 DIAGNOSIS — G8929 Other chronic pain: Secondary | ICD-10-CM | POA: Diagnosis not present

## 2019-10-16 DIAGNOSIS — R52 Pain, unspecified: Secondary | ICD-10-CM

## 2019-10-16 MED ORDER — HYDROCODONE-ACETAMINOPHEN 10-325 MG PO TABS: 1.0000 | ORAL_TABLET | Freq: Three times a day (TID) | ORAL | 0 refills | Status: DC | PRN

## 2019-10-16 MED ORDER — MELOXICAM 7.5 MG PO TABS: 7.5000 mg | ORAL_TABLET | Freq: Every day | ORAL | 3 refills | Status: DC

## 2019-10-16 MED ORDER — CITALOPRAM HYDROBROMIDE 40 MG PO TABS: 40.0000 mg | ORAL_TABLET | Freq: Every day | ORAL | 3 refills | Status: DC

## 2019-10-16 MED ORDER — HYDROCODONE-ACETAMINOPHEN 10-325 MG PO TABS: 1.0000 | ORAL_TABLET | Freq: Four times a day (QID) | ORAL | 0 refills | Status: DC | PRN

## 2019-10-16 MED ORDER — SOLIFENACIN SUCCINATE 5 MG PO TABS: 5.0000 mg | ORAL_TABLET | Freq: Every day | ORAL | 3 refills | Status: DC

## 2019-10-16 NOTE — Progress Notes (Signed)
Virtual Visit via telephone Note Due to COVID-19 pandemic this visit was conducted virtually. This visit type was conducted due to national recommendations for restrictions regarding the COVID-19 Pandemic (e.g. social distancing, sheltering in place) in an effort to limit this patient's exposure and mitigate transmission in our community. All issues noted in this document were discussed and addressed.  A physical exam was not performed with this format.  I connected with Allison Parker on 10/16/19 at 9:26 AM  by telephone and verified that I am speaking with the correct person using two identifiers. Allison Parker is currently located at home and no one  is currently with her during visit. The provider, Evelina Dun, FNP is located in their office at time of visit.  I discussed the limitations, risks, security and privacy concerns of performing an evaluation and management service by telephone and the availability of in person appointments. I also discussed with the patient that there may be a patient responsible charge related to this service. The patient expressed understanding and agreed to proceed.   History and Present Illness:  Pt presents to the office today for chronic follow up and pain medication refillfor chronic back pain and thalamic pain syndrome. 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 PT states this is stable at this time. Pt is followed by Ortho annually for arthritis.  She states her husband is currently in the hospital related to [place]. He is doing better. She states she also had COVID, but is doing well.  Hypertension This is a chronic problem. The current episode started more than 1 year ago. The problem has been resolved since onset. The problem is controlled. Associated symptoms include anxiety and peripheral edema. Pertinent negatives include no chest pain, malaise/fatigue or shortness of breath. Risk factors for coronary artery disease  include dyslipidemia, obesity and sedentary lifestyle. The current treatment provides moderate improvement. Hypertensive end-organ damage includes CVA. There is no history of angina. Identifiable causes of hypertension include a thyroid problem.  Thyroid Problem Presents for follow-up visit. Patient reports no depressed mood, diarrhea, dry skin or fatigue. The symptoms have been stable. Her past medical history is significant for hyperlipidemia.  Hyperlipidemia This is a chronic problem. The current episode started more than 1 year ago. The problem is controlled. Recent lipid tests were reviewed and are normal. Exacerbating diseases include hypothyroidism. Pertinent negatives include no chest pain or shortness of breath. Risk factors for coronary artery disease include hypertension, a sedentary lifestyle, post-menopausal and dyslipidemia.  Depression        This is a chronic problem.  The current episode started more than 1 year ago.   The onset quality is gradual.   The problem occurs intermittently.  The problem has been waxing and waning since onset.  Associated symptoms include restlessness, decreased interest and sad.  Associated symptoms include no decreased concentration, no fatigue, no helplessness and no hopelessness.     The symptoms are aggravated by family issues.  Past treatments include SSRIs - Selective serotonin reuptake inhibitors.  Compliance with treatment is good.  Past medical history includes hypothyroidism, thyroid problem and anxiety.   Anxiety Presents for follow-up visit. Symptoms include excessive worry, irritability, panic and restlessness. Patient reports no chest pain, decreased concentration, depressed mood or shortness of breath.    Back Pain This is a chronic problem. The current episode started more than 1 year ago. The problem occurs intermittently. The problem has been waxing and  waning since onset. The pain is present in the lumbar spine. The quality of the pain is  described as aching. The pain is at a severity of 7/10. The pain is moderate. Pertinent negatives include no chest pain, dysuria, numbness or paresis. Risk factors include lack of exercise, obesity and sedentary lifestyle. The treatment provided mild relief.      Review of Systems  Constitutional: Positive for irritability. Negative for fatigue and malaise/fatigue.  Respiratory: Negative for shortness of breath.   Cardiovascular: Negative for chest pain.  Gastrointestinal: Negative for diarrhea.  Genitourinary: Negative for dysuria.  Musculoskeletal: Positive for back pain.  Neurological: Negative for numbness.  Psychiatric/Behavioral: Positive for depression. Negative for decreased concentration.     Observations/Objective: No SOB or distress noted   Assessment and Plan: Allison Parker comes in today with chief complaint of No chief complaint on file.   Diagnosis and orders addressed:  1. Pain management - HYDROcodone-acetaminophen (NORCO) 10-325 MG tablet; Take 1 tablet by mouth every 6 (six) hours as needed.  Dispense: 90 tablet; Refill: 0 - HYDROcodone-acetaminophen (NORCO) 10-325 MG tablet; Take 1 tablet by mouth every 8 (eight) hours as needed.  Dispense: 90 tablet; Refill: 0 - HYDROcodone-acetaminophen (NORCO) 10-325 MG tablet; Take 1 tablet by mouth every 8 (eight) hours as needed.  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  Dispense: 90 tablet; Refill: 0 - HYDROcodone-acetaminophen (NORCO) 10-325 MG tablet; Take 1 tablet by mouth every 8 (eight) hours as needed.  Dispense: 90 tablet; Refill: 0 - HYDROcodone-acetaminophen (NORCO) 10-325 MG tablet; Take 1 tablet by mouth every 8 (eight) hours as needed.  Dispense: 90 tablet; Refill: 0 - meloxicam (MOBIC) 7.5 MG tablet; Take 1 tablet (7.5 mg total) by mouth daily.   Dispense: 90 tablet; Refill: 3  3. Thalamic pain syndrome - HYDROcodone-acetaminophen (NORCO) 10-325 MG tablet; Take 1 tablet by mouth every 6 (six) hours as needed.  Dispense: 90 tablet; Refill: 0 - HYDROcodone-acetaminophen (NORCO) 10-325 MG tablet; Take 1 tablet by mouth every 8 (eight) hours as needed.  Dispense: 90 tablet; Refill: 0 - HYDROcodone-acetaminophen (NORCO) 10-325 MG tablet; Take 1 tablet by mouth every 8 (eight) hours as needed.  Dispense: 90 tablet; Refill: 0 - meloxicam (MOBIC) 7.5 MG tablet; Take 1 tablet (7.5 mg total) by mouth daily.  Dispense: 90 tablet; Refill: 3  4. Pain medication agreement signed - HYDROcodone-acetaminophen (NORCO) 10-325 MG tablet; Take 1 tablet by mouth every 6 (six) hours as needed.  Dispense: 90 tablet; Refill: 0 - HYDROcodone-acetaminophen (NORCO) 10-325 MG tablet; Take 1 tablet by mouth every 8 (eight) hours as needed.  Dispense: 90 tablet; Refill: 0 - HYDROcodone-acetaminophen (NORCO) 10-325 MG tablet; Take 1 tablet by mouth every 8 (eight) hours as needed.  Dispense: 90 tablet; Refill: 0  5. Uncomplicated opioid dependence (HCC) - HYDROcodone-acetaminophen (NORCO) 10-325 MG tablet; Take 1 tablet by mouth every 6 (six) hours as needed.  Dispense: 90 tablet; Refill: 0 - HYDROcodone-acetaminophen (NORCO) 10-325 MG tablet; Take 1 tablet by mouth every 8 (eight) hours as needed.  Dispense: 90 tablet; Refill: 0 - HYDROcodone-acetaminophen (NORCO) 10-325 MG tablet; Take 1 tablet by mouth every 8 (eight) hours as needed.  Dispense: 90 tablet; Refill: 0  6. Overactive bladder - solifenacin (VESICARE) 5 MG tablet; Take 1 tablet (5 mg total) by mouth daily.  Dispense: 90 tablet; Refill: 3  7. GAD (generalized anxiety disorder) - citalopram (CELEXA) 40 MG tablet; Take 1 tablet (40 mg total) by mouth daily.  Dispense: 90 tablet; Refill: 3  8. Moderate episode of recurrent major depressive disorder (HCC) - citalopram (CELEXA) 40 MG tablet; Take 1 tablet  (40 mg total) by mouth daily.  Dispense: 90 tablet; Refill: 3   Pt reviewed in [place] controlled database- No red flags noted  Health Maintenance reviewed Diet and exercise encouraged  Follow up plan: 3 months       I discussed the assessment and treatment plan with the patient. The patient was provided an opportunity to ask questions and all were answered. The patient agreed with the plan and demonstrated an understanding of the instructions.   The patient was advised to call back or seek an in-person evaluation if the symptoms worsen or if the condition fails to improve as anticipated.  The above assessment and management plan was discussed with the patient. The patient verbalized understanding of and has agreed to the management plan. Patient is aware to call the clinic if symptoms persist or worsen. Patient is aware when to return to the clinic for a follow-up visit. Patient educated on when it is appropriate to go to the emergency department.   Time call ended:  9:51 AM  I provided  25 minutes of non-face-to-face time during this encounter.    Evelina Dun, FNP

## 2020-01-08 ENCOUNTER — Other Ambulatory Visit: Payer: Self-pay | Admitting: Family

## 2020-01-08 DIAGNOSIS — E039 Hypothyroidism, unspecified: Secondary | ICD-10-CM

## 2020-01-15 ENCOUNTER — Other Ambulatory Visit: Payer: Self-pay

## 2020-01-15 ENCOUNTER — Ambulatory Visit (INDEPENDENT_AMBULATORY_CARE_PROVIDER_SITE_OTHER): Payer: Medicare Other | Admitting: Family

## 2020-01-15 ENCOUNTER — Encounter: Payer: Self-pay | Admitting: Family

## 2020-01-15 VITALS — BP 137/81 | HR 84 | Temp 97.7°F | Ht 65.0 in | Wt 198.6 lb

## 2020-01-15 DIAGNOSIS — F331 Major depressive disorder, recurrent, moderate: Secondary | ICD-10-CM

## 2020-01-15 DIAGNOSIS — I10 Essential (primary) hypertension: Secondary | ICD-10-CM

## 2020-01-15 DIAGNOSIS — Z0289 Encounter for other administrative examinations: Secondary | ICD-10-CM

## 2020-01-15 DIAGNOSIS — M5442 Lumbago with sciatica, left side: Secondary | ICD-10-CM

## 2020-01-15 DIAGNOSIS — E785 Hyperlipidemia, unspecified: Secondary | ICD-10-CM

## 2020-01-15 DIAGNOSIS — G8929 Other chronic pain: Secondary | ICD-10-CM

## 2020-01-15 DIAGNOSIS — E669 Obesity, unspecified: Secondary | ICD-10-CM

## 2020-01-15 DIAGNOSIS — N3281 Overactive bladder: Secondary | ICD-10-CM

## 2020-01-15 DIAGNOSIS — G890 Central pain syndrome: Secondary | ICD-10-CM

## 2020-01-15 DIAGNOSIS — E039 Hypothyroidism, unspecified: Secondary | ICD-10-CM

## 2020-01-15 DIAGNOSIS — R52 Pain, unspecified: Secondary | ICD-10-CM

## 2020-01-15 DIAGNOSIS — F1120 Opioid dependence, uncomplicated: Secondary | ICD-10-CM

## 2020-01-15 DIAGNOSIS — F411 Generalized anxiety disorder: Secondary | ICD-10-CM

## 2020-01-15 DIAGNOSIS — G4700 Insomnia, unspecified: Secondary | ICD-10-CM

## 2020-01-15 DIAGNOSIS — I6930 Unspecified sequelae of cerebral infarction: Secondary | ICD-10-CM

## 2020-01-15 MED ORDER — SOLIFENACIN SUCCINATE 5 MG PO TABS: 5.0000 mg | ORAL_TABLET | Freq: Every day | ORAL | 3 refills | Status: DC

## 2020-01-15 MED ORDER — GABAPENTIN 100 MG PO CAPS: 100.0000 mg | ORAL_CAPSULE | Freq: Three times a day (TID) | ORAL | 3 refills | Status: DC

## 2020-01-15 MED ORDER — HYDROCODONE-ACETAMINOPHEN 10-325 MG PO TABS: 1.0000 | ORAL_TABLET | Freq: Four times a day (QID) | ORAL | 0 refills | Status: DC | PRN

## 2020-01-15 MED ORDER — LEVOTHYROXINE SODIUM 25 MCG PO TABS: 25.0000 ug | ORAL_TABLET | Freq: Every day | ORAL | 0 refills | Status: DC

## 2020-01-15 MED ORDER — HYDROCODONE-ACETAMINOPHEN 10-325 MG PO TABS: 1.0000 | ORAL_TABLET | Freq: Three times a day (TID) | ORAL | 0 refills | Status: DC | PRN

## 2020-01-15 NOTE — Patient Instructions (Signed)
Sciatica Rehab Ask your health care provider which exercises are safe for you. Do exercises exactly as told by your health care provider and adjust them as directed. It is normal to feel mild stretching, pulling, tightness, or discomfort as you do these exercises. Stop right away if you feel sudden pain or your pain gets worse. Do not begin these exercises until told by your health care provider. Stretching and range-of-motion exercises These exercises warm up your muscles and joints and improve the movement and flexibility of your hips and back. These exercises also help to relieve pain, numbness, and tingling. Sciatic nerve glide 1. Sit in a chair with your head facing down toward your chest. Place your hands behind your back. Let your shoulders slump forward. 2. Slowly straighten one of your legs while you tilt your head back as if you are looking toward the ceiling. Only straighten your leg as far as you can without making your symptoms worse. 3. Hold this position for __________ seconds. 4. Slowly return to the starting position. 5. Repeat with your other leg. Repeat __________ times. Complete this exercise __________ times a day. Knee to chest with hip adduction and internal rotation  1. Lie on your back on a firm surface with both legs straight. 2. Bend one of your knees and move it up toward your chest until you feel a gentle stretch in your lower back and buttock. Then, move your knee toward the shoulder that is on the opposite side from your leg. This is hip adduction and internal rotation. ? Hold your leg in this position by holding on to the front of your knee. 3. Hold this position for __________ seconds. 4. Slowly return to the starting position. 5. Repeat with your other leg. Repeat __________ times. Complete this exercise __________ times a day. Prone extension on elbows  1. Lie on your abdomen on a firm surface. A bed may be too soft for this exercise. 2. Prop yourself up on  your elbows. 3. Use your arms to help lift your chest up until you feel a gentle stretch in your abdomen and your lower back. ? This will place some of your body weight on your elbows. If this is uncomfortable, try stacking pillows under your chest. ? Your hips should stay down, against the surface that you are lying on. Keep your hip and back muscles relaxed. 4. Hold this position for __________ seconds. 5. Slowly relax your upper body and return to the starting position. Repeat __________ times. Complete this exercise __________ times a day. Strengthening exercises These exercises build strength and endurance in your back. Endurance is the ability to use your muscles for a long time, even after they get tired. Pelvic tilt This exercise strengthens the muscles that lie deep in the abdomen. 1. Lie on your back on a firm surface. Bend your knees and keep your feet flat on the floor. 2. Tense your abdominal muscles. Tip your pelvis up toward the ceiling and flatten your lower back into the floor. ? To help with this exercise, you may place a small towel under your lower back and try to push your back into the towel. 3. Hold this position for __________ seconds. 4. Let your muscles relax completely before you repeat this exercise. Repeat __________ times. Complete this exercise __________ times a day. Alternating arm and leg raises  1. Get on your hands and knees on a firm surface. If you are on a hard floor, you may want to use   padding, such as an exercise mat, to cushion your knees. 2. Line up your arms and legs. Your hands should be directly below your shoulders, and your knees should be directly below your hips. 3. Lift your left leg behind you. At the same time, raise your right arm and straighten it in front of you. ? Do not lift your leg higher than your hip. ? Do not lift your arm higher than your shoulder. ? Keep your abdominal and back muscles tight. ? Keep your hips facing the  ground. ? Do not arch your back. ? Keep your balance carefully, and do not hold your breath. 4. Hold this position for __________ seconds. 5. Slowly return to the starting position. 6. Repeat with your right leg and your left arm. Repeat __________ times. Complete this exercise __________ times a day. Posture and body mechanics Good posture and healthy body mechanics can help to relieve stress in your body's tissues and joints. Body mechanics refers to the movements and positions of your body while you do your daily activities. Posture is part of body mechanics. Good posture means:  Your spine is in its natural S-curve position (neutral).  Your shoulders are pulled back slightly.  Your head is not tipped forward. Follow these guidelines to improve your posture and body mechanics in your everyday activities. Standing   When standing, keep your spine neutral and your feet about hip width apart. Keep a slight bend in your knees. Your ears, shoulders, and hips should line up.  When you do a task in which you stand in one place for a long time, place one foot up on a stable object that is 2-4 inches (5-10 cm) high, such as a footstool. This helps keep your spine neutral. Sitting   When sitting, keep your spine neutral and keep your feet flat on the floor. Use a footrest, if necessary, and keep your thighs parallel to the floor. Avoid rounding your shoulders, and avoid tilting your head forward.  When working at a desk or a computer, keep your desk at a height where your hands are slightly lower than your elbows. Slide your chair under your desk so you are close enough to maintain good posture.  When working at a computer, place your monitor at a height where you are looking straight ahead and you do not have to tilt your head forward or downward to look at the screen. Resting  When lying down and resting, avoid positions that are most painful for you.  If you have pain with activities  such as sitting, bending, stooping, or squatting, lie in a position in which your body does not bend very much. For example, avoid curling up on your side with your arms and knees near your chest (fetal position).  If you have pain with activities such as standing for a long time or reaching with your arms, lie with your spine in a neutral position and bend your knees slightly. Try the following positions: ? Lying on your side with a pillow between your knees. ? Lying on your back with a pillow under your knees. Lifting   When lifting objects, keep your feet at least shoulder width apart and tighten your abdominal muscles.  Bend your knees and hips and keep your spine neutral. It is important to lift using the strength of your legs, not your back. Do not lock your knees straight out.  Always ask for help to lift heavy or awkward objects. This information is not   intended to replace advice given to you by your health care provider. Make sure you discuss any questions you have with your health care provider. Document Revised: 02/02/2019 Document Reviewed: 11/02/2018 [employer] Patient Education  2020 [employer]  

## 2020-01-15 NOTE — Progress Notes (Signed)
Subjective:    Patient ID: Allison Parker, female    DOB: 1941-05-24, 79 y.o.   MRN: [id number]  Chief Complaint  Patient presents with  . Medical Management of Chronic Issues   Pt presents to the office today for chronic follow up and pain medication refillfor chronic back pain and thalamic pain syndrome. 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 PT states this is stable at this time. Pt is followed by Ortho annually for arthritis. Hypertension This is a chronic problem. The current episode started more than 1 year ago. The problem has been resolved since onset. The problem is controlled. Associated symptoms include anxiety, malaise/fatigue and peripheral edema. Pertinent negatives include no shortness of breath. Risk factors for coronary artery disease include dyslipidemia, obesity and sedentary lifestyle. The current treatment provides moderate improvement. Identifiable causes of hypertension include a thyroid problem.  Thyroid Problem Presents for follow-up visit. Symptoms include anxiety, constipation and fatigue. Patient reports no depressed mood or diarrhea. The symptoms have been improving. Her past medical history is significant for hyperlipidemia.  Urinary Frequency  This is a chronic problem. The current episode started more than 1 year ago. The problem occurs intermittently. The problem has been waxing and waning. The pain is at a severity of 0/10. The patient is experiencing no pain. Associated symptoms include frequency.  Insomnia Primary symptoms: difficulty falling asleep, frequent awakening, malaise/fatigue.  The current episode started more than one year. The onset quality is gradual. The problem occurs intermittently. PMH includes: depression.  Hyperlipidemia This is a chronic problem. The current episode started more than 1 year ago. The problem is controlled. Recent lipid tests were reviewed and are normal. Exacerbating diseases include  hypothyroidism and obesity. Associated symptoms include leg pain. Pertinent negatives include no shortness of breath. Current antihyperlipidemic treatment includes statins. The current treatment provides moderate improvement of lipids. Risk factors for coronary artery disease include dyslipidemia, hypertension, a sedentary lifestyle and post-menopausal.  Anxiety Presents for follow-up visit. Symptoms include excessive worry, insomnia, nervous/anxious behavior and restlessness. Patient reports no depressed mood or shortness of breath. The severity of symptoms is moderate. The quality of sleep is good.    Depression        This is a chronic problem.  The onset quality is gradual.   The problem occurs intermittently.  The problem has been waxing and waning since onset.  Associated symptoms include fatigue, insomnia, irritable, restlessness and sad.  Past treatments include SSRIs - Selective serotonin reuptake inhibitors.  Compliance with treatment is good.  Past medical history includes hypothyroidism, thyroid problem and anxiety.   Back Pain This is a chronic problem. The current episode started more than 1 year ago. The problem occurs intermittently. The problem has been waxing and waning since onset. The pain is present in the lumbar spine. The quality of the pain is described as aching. The pain is at a severity of 8/10. The pain is moderate. Associated symptoms include leg pain. She has tried analgesics and bed rest for the symptoms. The treatment provided moderate relief.      Review of Systems  Constitutional: Positive for fatigue and malaise/fatigue.  Respiratory: Negative for shortness of breath.   Gastrointestinal: Positive for constipation. Negative for diarrhea.  Genitourinary: Positive for frequency.  Musculoskeletal: Positive for back pain.  Psychiatric/Behavioral: Positive for depression. The patient is nervous/anxious and has insomnia.   All other systems reviewed and are  negative.  Objective:   Physical Exam Vitals reviewed.  Constitutional:      General: She is irritable. She is not in acute distress.    Appearance: She is well-developed.  HENT:     Head: Normocephalic and atraumatic.     Right Ear: Tympanic membrane normal.     Left Ear: Tympanic membrane normal.  Eyes:     Pupils: Pupils are equal, round, and reactive to light.  Neck:     Thyroid: No thyromegaly.  Cardiovascular:     Rate and Rhythm: Normal rate and regular rhythm.     Heart sounds: Normal heart sounds. No murmur.  Pulmonary:     Effort: Pulmonary effort is normal. No respiratory distress.     Breath sounds: Normal breath sounds. No wheezing.  Abdominal:     General: Bowel sounds are normal. There is no distension.     Palpations: Abdomen is soft.     Tenderness: There is no abdominal tenderness.  Musculoskeletal:        General: No tenderness. Normal range of motion.     Cervical back: Normal range of motion and neck supple.  Skin:    General: Skin is warm and dry.  Neurological:     Mental Status: She is alert and oriented to person, place, and time.     Cranial Nerves: No cranial nerve deficit.     Deep Tendon Reflexes: Reflexes are normal and symmetric.  Psychiatric:        Behavior: Behavior normal.        Thought Content: Thought content normal.        Judgment: Judgment normal.       BP 137/81   Pulse 84   Temp 97.7 F (36.5 C) (Temporal)   Ht _0  (1.651 m)   Wt 198 lb 9.6 oz (90.1 kg)   SpO2 97%   BMI 33.05 kg/m      Assessment & Plan:  [name] comes in today with chief complaint of Medical Management of Chronic Issues   Diagnosis and orders addressed:  1. Pain management - HYDROcodone-acetaminophen (NORCO) 10-325 MG tablet; Take 1 tablet by mouth every 6 (six) hours as needed.  Dispense: 90 tablet; Refill: 0 - HYDROcodone-acetaminophen (NORCO) 10-325 MG tablet; Take 1 tablet by mouth every 8 (eight) hours as needed.  Dispense:  90 tablet; Refill: 0 - HYDROcodone-acetaminophen (NORCO) 10-325 MG tablet; Take 1 tablet by mouth every 8 (eight) hours as needed.  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  Dispense: 90 tablet; Refill: 0 - HYDROcodone-acetaminophen (NORCO) 10-325 MG tablet; Take 1 tablet by mouth every 8 (eight) hours as needed.  Dispense: 90 tablet; Refill: 0 - HYDROcodone-acetaminophen (NORCO) 10-325 MG tablet; Take 1 tablet by mouth every 8 (eight) hours as needed.  Dispense: 90 tablet; Refill: 0 - CMP14+EGFR - CBC with Differential/Platelet  3. Thalamic pain syndrome - HYDROcodone-acetaminophen (NORCO) 10-325 MG tablet; Take 1 tablet by mouth every 6 (six) hours as needed.  Dispense: 90 tablet; Refill: 0 - HYDROcodone-acetaminophen (NORCO) 10-325 MG tablet; Take 1 tablet by mouth every 8 (eight) hours as needed.  Dispense: 90 tablet; Refill: 0 - HYDROcodone-acetaminophen (NORCO) 10-325 MG tablet; Take 1 tablet by mouth every 8 (eight) hours as needed.  Dispense: 90 tablet; Refill: 0 - CMP14+EGFR - CBC with Differential/Platelet  4. Pain medication agreement signed - ToxASSURE Select 13 (MW), Urine - HYDROcodone-acetaminophen (NORCO) 10-325 MG tablet; Take 1 tablet by mouth every 6 (six) hours as needed.  Dispense: 90 tablet; Refill: 0 - HYDROcodone-acetaminophen (NORCO) 10-325 MG tablet; Take 1 tablet by mouth every 8 (eight) hours as needed.  Dispense: 90 tablet; Refill: 0 - HYDROcodone-acetaminophen (NORCO) 10-325 MG tablet; Take 1 tablet by mouth every 8 (eight) hours as needed.  Dispense: 90 tablet; Refill: 0 - CMP14+EGFR - CBC with Differential/Platelet  5. Uncomplicated opioid dependence ([place]) - ToxASSURE Select 13 (MW), Urine - HYDROcodone-acetaminophen (NORCO) 10-325 MG tablet; Take 1  tablet by mouth every 6 (six) hours as needed.  Dispense: 90 tablet; Refill: 0 - HYDROcodone-acetaminophen (NORCO) 10-325 MG tablet; Take 1 tablet by mouth every 8 (eight) hours as needed.  Dispense: 90 tablet; Refill: 0 - HYDROcodone-acetaminophen (NORCO) 10-325 MG tablet; Take 1 tablet by mouth every 8 (eight) hours as needed.  Dispense: 90 tablet; Refill: 0 - CMP14+EGFR - CBC with Differential/Platelet  6. Hypothyroidism, unspecified type - levothyroxine (SYNTHROID) 25 MCG tablet; Take 1 tablet (25 mcg total) by mouth daily.  Dispense: 90 tablet; Refill: 0 - CMP14+EGFR - CBC with Differential/Platelet - TSH  7. Overactive bladder  - solifenacin (VESICARE) 5 MG tablet; Take 1 tablet (5 mg total) by mouth daily.  Dispense: 90 tablet; Refill: 3 - CMP14+EGFR - CBC with Differential/Platelet  8. Essential hypertension, benign - CMP14+EGFR - CBC with Differential/Platelet  9. Moderate episode of recurrent major depressive disorder (HCC) - CMP14+EGFR - CBC with Differential/Platelet  10. GAD (generalized anxiety disorder) - CMP14+EGFR - CBC with Differential/Platelet  11. Hyperlipidemia, unspecified hyperlipidemia type  - CMP14+EGFR - CBC with Differential/Platelet - Lipid panel  12. Late effect of cerebrovascular accident - CMP14+EGFR - CBC with Differential/Platelet  13. Insomnia, unspecified type  - CMP14+EGFR - CBC with Differential/Platelet  14. Obesity (BMI 30-39.9)  - CMP14+EGFR - CBC with Differential/Platelet   Labs pending Contract and drug screen up dated today PT reviewed in [place] controlled database Health Maintenance reviewed Diet and exercise encouraged  Follow up plan: 3 months    Evelina Dun, FNP

## 2020-01-16 LAB — CMP14+EGFR
ALT: 34 IU/L — ABNORMAL HIGH (ref 0–32)
AST: 32 IU/L (ref 0–40)
Albumin/Globulin Ratio: 2.1 (ref 1.2–2.2)
Albumin: 4.8 g/dL — ABNORMAL HIGH (ref 3.7–4.7)
Alkaline Phosphatase: 172 IU/L — ABNORMAL HIGH (ref 39–117)
BUN/Creatinine Ratio: 25 (ref 12–28)
BUN: 21 mg/dL (ref 8–27)
Bilirubin Total: 0.3 mg/dL (ref 0.0–1.2)
CO2: 25 mmol/L (ref 20–29)
Calcium: 9.4 mg/dL (ref 8.7–10.3)
Chloride: 101 mmol/L (ref 96–106)
Creatinine, Ser: 0.83 mg/dL (ref 0.57–1.00)
GFR calc Af Amer: 79 mL/min/{1.73_m2} (ref >59)
GFR calc non Af Amer: 69 mL/min/{1.73_m2} (ref >59)
Globulin, Total: 2.3 g/dL (ref 1.5–4.5)
Glucose: 95 mg/dL (ref 65–99)
Potassium: 4.7 mmol/L (ref 3.5–5.2)
Sodium: 140 mmol/L (ref 134–144)
Total Protein: 7.1 g/dL (ref 6.0–8.5)

## 2020-01-16 LAB — CBC WITH DIFFERENTIAL/PLATELET
Basophils Absolute: 0 10*3/uL (ref 0.0–0.2)
Basos: 1 %
EOS (ABSOLUTE): 0.1 10*3/uL (ref 0.0–0.4)
Eos: 2 %
Hematocrit: 42.7 % (ref 34.0–46.6)
Hemoglobin: 14 g/dL (ref 11.1–15.9)
Immature Grans (Abs): 0 10*3/uL (ref 0.0–0.1)
Immature Granulocytes: 0 %
Lymphocytes Absolute: 1.7 10*3/uL (ref 0.7–3.1)
Lymphs: 25 %
MCH: 30.6 pg (ref 26.6–33.0)
MCHC: 32.8 g/dL (ref 31.5–35.7)
MCV: 93 fL (ref 79–97)
Monocytes Absolute: 0.8 10*3/uL (ref 0.1–0.9)
Monocytes: 11 %
Neutrophils Absolute: 4.2 10*3/uL (ref 1.4–7.0)
Neutrophils: 61 %
Platelets: 243 10*3/uL (ref 150–450)
RBC: 4.57 x10E6/uL (ref 3.77–5.28)
RDW: 12.3 % (ref 11.7–15.4)
WBC: 7 10*3/uL (ref 3.4–10.8)

## 2020-01-16 LAB — LIPID PANEL
Chol/HDL Ratio: 2.1 ratio (ref 0.0–4.4)
Cholesterol, Total: 167 mg/dL (ref 100–199)
HDL: 80 mg/dL (ref >39)
LDL Chol Calc (NIH): 65 mg/dL (ref 0–99)
Triglycerides: 128 mg/dL (ref 0–149)
VLDL Cholesterol Cal: 22 mg/dL (ref 5–40)

## 2020-01-16 LAB — TSH: TSH: 2.56 u[IU]/mL (ref 0.450–4.500)

## 2020-01-18 ENCOUNTER — Other Ambulatory Visit: Payer: Self-pay | Admitting: Family

## 2020-01-18 LAB — TOXASSURE SELECT 13 (MW), URINE

## 2020-03-07 ENCOUNTER — Other Ambulatory Visit: Payer: Self-pay | Admitting: Family

## 2020-03-07 DIAGNOSIS — E785 Hyperlipidemia, unspecified: Secondary | ICD-10-CM

## 2020-04-09 ENCOUNTER — Other Ambulatory Visit: Payer: Self-pay | Admitting: *Deleted

## 2020-04-09 DIAGNOSIS — E039 Hypothyroidism, unspecified: Secondary | ICD-10-CM

## 2020-04-09 MED ORDER — LEVOTHYROXINE SODIUM 25 MCG PO TABS: 25.0000 ug | ORAL_TABLET | Freq: Every day | ORAL | 2 refills | Status: DC

## 2020-04-14 ENCOUNTER — Encounter: Payer: Self-pay | Admitting: Family

## 2020-04-14 ENCOUNTER — Ambulatory Visit (INDEPENDENT_AMBULATORY_CARE_PROVIDER_SITE_OTHER): Payer: Medicare Other | Admitting: Family

## 2020-04-14 ENCOUNTER — Other Ambulatory Visit: Payer: Self-pay

## 2020-04-14 VITALS — BP 134/79 | HR 78 | Temp 97.8°F | Ht 65.0 in | Wt 202.2 lb

## 2020-04-14 DIAGNOSIS — I10 Essential (primary) hypertension: Secondary | ICD-10-CM

## 2020-04-14 DIAGNOSIS — E669 Obesity, unspecified: Secondary | ICD-10-CM

## 2020-04-14 DIAGNOSIS — N3281 Overactive bladder: Secondary | ICD-10-CM

## 2020-04-14 DIAGNOSIS — G4700 Insomnia, unspecified: Secondary | ICD-10-CM

## 2020-04-14 DIAGNOSIS — G890 Central pain syndrome: Secondary | ICD-10-CM

## 2020-04-14 DIAGNOSIS — Z0289 Encounter for other administrative examinations: Secondary | ICD-10-CM

## 2020-04-14 DIAGNOSIS — F331 Major depressive disorder, recurrent, moderate: Secondary | ICD-10-CM

## 2020-04-14 DIAGNOSIS — E039 Hypothyroidism, unspecified: Secondary | ICD-10-CM

## 2020-04-14 DIAGNOSIS — E559 Vitamin D deficiency, unspecified: Secondary | ICD-10-CM

## 2020-04-14 DIAGNOSIS — R52 Pain, unspecified: Secondary | ICD-10-CM | POA: Diagnosis not present

## 2020-04-14 DIAGNOSIS — F1120 Opioid dependence, uncomplicated: Secondary | ICD-10-CM

## 2020-04-14 DIAGNOSIS — M5442 Lumbago with sciatica, left side: Secondary | ICD-10-CM

## 2020-04-14 DIAGNOSIS — I6930 Unspecified sequelae of cerebral infarction: Secondary | ICD-10-CM

## 2020-04-14 DIAGNOSIS — E785 Hyperlipidemia, unspecified: Secondary | ICD-10-CM

## 2020-04-14 DIAGNOSIS — F411 Generalized anxiety disorder: Secondary | ICD-10-CM

## 2020-04-14 DIAGNOSIS — G8929 Other chronic pain: Secondary | ICD-10-CM

## 2020-04-14 MED ORDER — HYDROCODONE-ACETAMINOPHEN 10-325 MG PO TABS: 1.0000 | ORAL_TABLET | Freq: Four times a day (QID) | ORAL | 0 refills | Status: DC | PRN

## 2020-04-14 MED ORDER — HYDROCODONE-ACETAMINOPHEN 10-325 MG PO TABS: 1.0000 | ORAL_TABLET | Freq: Three times a day (TID) | ORAL | 0 refills | Status: DC | PRN

## 2020-04-14 NOTE — Patient Instructions (Signed)

## 2020-04-14 NOTE — Progress Notes (Signed)
Subjective:    Patient ID: Allison Parker, female    DOB: [birth year], 79 y.o.   MRN: [id number]  Chief Complaint  Patient presents with  . Hypertension  . Hyperlipidemia   Pt presents to the office today for chronic follow up and pain medication refillfor chronic back pain and thalamic pain syndrome. 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 PT states this is stable at this time. Pt is followed by Ortho annually for arthritis.  Her husband is home finally. He was in the hospital for 5 months with COVID.  Hypertension This is a chronic problem. The current episode started more than 1 year ago. The problem has been resolved since onset. The problem is controlled. Associated symptoms include anxiety, malaise/fatigue and peripheral edema. Pertinent negatives include no shortness of breath. Risk factors for coronary artery disease include dyslipidemia, obesity and sedentary lifestyle. The current treatment provides moderate improvement. There is no history of CVA or heart failure. Identifiable causes of hypertension include a thyroid problem.  Hyperlipidemia This is a chronic problem. The current episode started more than 1 year ago. The problem is controlled. Recent lipid tests were reviewed and are normal. Exacerbating diseases include obesity. Pertinent negatives include no shortness of breath. Current antihyperlipidemic treatment includes statins. The current treatment provides moderate improvement of lipids. Risk factors for coronary artery disease include dyslipidemia, female sex, hypertension and a sedentary lifestyle.  Thyroid Problem Presents for follow-up visit. Symptoms include depressed mood and fatigue. The symptoms have been stable. Her past medical history is significant for hyperlipidemia. There is no history of heart failure.  Back Pain This is a chronic problem. The current episode started more than 1 year ago. The problem occurs  intermittently. The pain is present in the lumbar spine. The pain is at a severity of 7/10. The pain is moderate. Risk factors include obesity. She has tried analgesics for the symptoms. The treatment provided mild relief.  Depression        This is a chronic problem.  The current episode started more than 1 year ago.   The onset quality is gradual.   The problem occurs intermittently.  Associated symptoms include fatigue, insomnia and sad.  Associated symptoms include no decreased concentration, not irritable and no restlessness.  Past treatments include SSRIs - Selective serotonin reuptake inhibitors.  Compliance with treatment is good.  Past medical history includes thyroid problem and anxiety.   Anxiety Presents for follow-up visit. Symptoms include depressed mood, insomnia and irritability. Patient reports no decreased concentration, excessive worry, restlessness or shortness of breath.    Urinary Frequency  This is a chronic problem. The current episode started more than 1 year ago. The problem occurs intermittently. The problem has been waxing and waning. The pain is at a severity of 0/10. The patient is experiencing no pain. Associated symptoms include frequency.  Insomnia Primary symptoms: difficulty falling asleep, malaise/fatigue.  The current episode started more than one year. PMH includes: depression.      Review of Systems  Constitutional: Positive for fatigue, irritability and malaise/fatigue.  Respiratory: Negative for shortness of breath.   Genitourinary: Positive for frequency.  Musculoskeletal: Positive for back pain.  Psychiatric/Behavioral: Positive for depression. Negative for decreased concentration. The patient has insomnia.   All other systems reviewed and are negative.      Objective:   Physical Exam Vitals reviewed.  Constitutional:      General: She is not irritable.She is  not in acute distress.    Appearance: She is well-developed.  HENT:     Head:  Normocephalic and atraumatic.     Right Ear: Tympanic membrane normal.     Left Ear: Tympanic membrane normal.  Eyes:     Pupils: Pupils are equal, round, and reactive to light.  Neck:     Thyroid: No thyromegaly.  Cardiovascular:     Rate and Rhythm: Normal rate and regular rhythm.     Heart sounds: Normal heart sounds. No murmur heard.   Pulmonary:     Effort: Pulmonary effort is normal. No respiratory distress.     Breath sounds: Normal breath sounds. No wheezing.  Abdominal:     General: Bowel sounds are normal. There is no distension.     Palpations: Abdomen is soft.     Tenderness: There is no abdominal tenderness.  Musculoskeletal:        General: No tenderness.     Cervical back: Normal range of motion and neck supple.     Comments: Pain in lumbar with flexion  Skin:    General: Skin is warm and dry.  Neurological:     Mental Status: She is alert and oriented to person, place, and time.     Cranial Nerves: No cranial nerve deficit.     Motor: Weakness present.     Gait: Gait abnormal (using cane).     Deep Tendon Reflexes: Reflexes are normal and symmetric.     Comments: Right sided weakness  Psychiatric:        Behavior: Behavior normal.        Thought Content: Thought content normal.        Judgment: Judgment normal.       BP 134/79   Pulse 78   Temp 97.8 F (36.6 C) (Temporal)   Ht _0  (1.651 m)   Wt 202 lb 3.2 oz (91.7 kg)   SpO2 98%   BMI 33.65 kg/m      Assessment & Plan:  Allison Parker comes in today with chief complaint of Hypertension and Hyperlipidemia   Diagnosis and orders addressed:  1. Pain management - HYDROcodone-acetaminophen (NORCO) 10-325 MG tablet; Take 1 tablet by mouth every 6 (six) hours as needed.  Dispense: 90 tablet; Refill: 0 - HYDROcodone-acetaminophen (NORCO) 10-325 MG tablet; Take 1 tablet by mouth every 8 (eight) hours as needed.  Dispense: 90 tablet; Refill: 0 - HYDROcodone-acetaminophen (NORCO) 10-325 MG tablet;  Take 1 tablet by mouth every 8 (eight) hours as needed.  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  Dispense: 90 tablet; Refill: 0 - HYDROcodone-acetaminophen (NORCO) 10-325 MG tablet; Take 1 tablet by mouth every 8 (eight) hours as needed.  Dispense: 90 tablet; Refill: 0 - HYDROcodone-acetaminophen (NORCO) 10-325 MG tablet; Take 1 tablet by mouth every 8 (eight) hours as needed.  Dispense: 90 tablet; Refill: 0 - CMP14+EGFR  3. Thalamic pain syndrome - HYDROcodone-acetaminophen (NORCO) 10-325 MG tablet; Take 1 tablet by mouth every 6 (six) hours as needed.  Dispense: 90 tablet; Refill: 0 - HYDROcodone-acetaminophen (NORCO) 10-325 MG tablet; Take 1 tablet by mouth every 8 (eight) hours as needed.  Dispense: 90 tablet; Refill: 0 - HYDROcodone-acetaminophen (NORCO) 10-325 MG tablet; Take 1 tablet by mouth every 8 (eight) hours as needed.  Dispense: 90 tablet; Refill: 0 - CMP14+EGFR  4. Pain medication agreement signed - HYDROcodone-acetaminophen (NORCO) 10-325 MG tablet; Take 1 tablet by mouth every 6 (six) hours as needed.  Dispense: 90 tablet; Refill: 0 - HYDROcodone-acetaminophen (NORCO) 10-325 MG tablet; Take 1 tablet by mouth every 8 (eight) hours as needed.  Dispense: 90 tablet; Refill: 0 - HYDROcodone-acetaminophen (NORCO) 10-325 MG tablet; Take 1 tablet by mouth every 8 (eight) hours as needed.  Dispense: 90 tablet; Refill: 0 - CMP14+EGFR  5. Uncomplicated opioid dependence (HCC) - HYDROcodone-acetaminophen (NORCO) 10-325 MG tablet; Take 1 tablet by mouth every 6 (six) hours as needed.  Dispense: 90 tablet; Refill: 0 - HYDROcodone-acetaminophen (NORCO) 10-325 MG tablet; Take 1 tablet by mouth every 8 (eight) hours as needed.  Dispense: 90 tablet; Refill: 0 - HYDROcodone-acetaminophen (NORCO) 10-325 MG tablet; Take 1  tablet by mouth every 8 (eight) hours as needed.  Dispense: 90 tablet; Refill: 0 - CMP14+EGFR  6. Essential hypertension, benign - CMP14+EGFR  7. Hypothyroidism, unspecified type - CMP14+EGFR  8. Moderate episode of recurrent major depressive disorder (HCC) - CMP14+EGFR  9. GAD (generalized anxiety disorder) - CMP14+EGFR  10. Hyperlipidemia, unspecified hyperlipidemia type - CMP14+EGFR  11. Insomnia, unspecified type - CMP14+EGFR  12. Obesity (BMI 30-39.9) - CMP14+EGFR  13. Late effect of cerebrovascular accident - CMP14+EGFR  14. Overactive bladder - CMP14+EGFR  15. Vitamin D deficiency - CMP14+EGFR   Labs pending Patient reviewed in [place] controlled database, no flags noted. Contract and drug screen are up to date.  Health Maintenance reviewed Diet and exercise encouraged  Follow up plan: 3 months    Evelina Dun, FNP

## 2020-04-15 ENCOUNTER — Other Ambulatory Visit: Payer: Self-pay | Admitting: Family

## 2020-04-15 DIAGNOSIS — R748 Abnormal levels of other serum enzymes: Secondary | ICD-10-CM

## 2020-04-15 LAB — CMP14+EGFR
ALT: 90 IU/L — ABNORMAL HIGH (ref 0–32)
AST: 118 IU/L — ABNORMAL HIGH (ref 0–40)
Albumin/Globulin Ratio: 1.8 (ref 1.2–2.2)
Albumin: 4.4 g/dL (ref 3.7–4.7)
Alkaline Phosphatase: 219 IU/L — ABNORMAL HIGH (ref 48–121)
BUN/Creatinine Ratio: 24 (ref 12–28)
BUN: 20 mg/dL (ref 8–27)
Bilirubin Total: 0.4 mg/dL (ref 0.0–1.2)
CO2: 24 mmol/L (ref 20–29)
Calcium: 9.1 mg/dL (ref 8.7–10.3)
Chloride: 101 mmol/L (ref 96–106)
Creatinine, Ser: 0.82 mg/dL (ref 0.57–1.00)
GFR calc Af Amer: 80 mL/min/{1.73_m2} (ref >59)
GFR calc non Af Amer: 70 mL/min/{1.73_m2} (ref >59)
Globulin, Total: 2.5 g/dL (ref 1.5–4.5)
Glucose: 87 mg/dL (ref 65–99)
Potassium: 4.8 mmol/L (ref 3.5–5.2)
Sodium: 141 mmol/L (ref 134–144)
Total Protein: 6.9 g/dL (ref 6.0–8.5)

## 2020-04-17 ENCOUNTER — Telehealth: Payer: Self-pay | Admitting: Family

## 2020-04-17 ENCOUNTER — Telehealth: Payer: Self-pay

## 2020-04-17 NOTE — Telephone Encounter (Signed)
Called patient in regards to [place] appointment - LMTCB

## 2020-04-17 NOTE — Telephone Encounter (Signed)
Patient notified of appointment.  

## 2020-04-22 ENCOUNTER — Other Ambulatory Visit: Payer: Self-pay

## 2020-04-22 ENCOUNTER — Ambulatory Visit (HOSPITAL_COMMUNITY)
Admission: RE | Admit: 2020-04-22 | Discharge: 2020-04-22 | Disposition: A | Discharge status: Home / Self Care | Payer: Medicare Other | Source: Ambulatory Visit | Attending: Family | Admitting: Family

## 2020-04-22 DIAGNOSIS — R748 Abnormal levels of other serum enzymes: Secondary | ICD-10-CM | POA: Diagnosis not present

## 2020-06-01 ENCOUNTER — Other Ambulatory Visit: Payer: Self-pay | Admitting: Family

## 2020-06-01 DIAGNOSIS — E785 Hyperlipidemia, unspecified: Secondary | ICD-10-CM

## 2020-07-07 ENCOUNTER — Other Ambulatory Visit: Payer: Self-pay | Admitting: Family

## 2020-07-07 DIAGNOSIS — I10 Essential (primary) hypertension: Secondary | ICD-10-CM

## 2020-07-14 ENCOUNTER — Other Ambulatory Visit: Payer: Self-pay | Admitting: Family

## 2020-07-14 DIAGNOSIS — I10 Essential (primary) hypertension: Secondary | ICD-10-CM

## 2020-07-21 ENCOUNTER — Encounter: Payer: Self-pay | Admitting: Family

## 2020-07-21 ENCOUNTER — Ambulatory Visit (INDEPENDENT_AMBULATORY_CARE_PROVIDER_SITE_OTHER): Payer: Medicare Other | Admitting: Family

## 2020-07-21 ENCOUNTER — Other Ambulatory Visit: Payer: Self-pay

## 2020-07-21 VITALS — BP 126/68 | HR 73 | Temp 97.7°F | Ht 65.0 in | Wt 205.0 lb

## 2020-07-21 DIAGNOSIS — Z0289 Encounter for other administrative examinations: Secondary | ICD-10-CM

## 2020-07-21 DIAGNOSIS — I6930 Unspecified sequelae of cerebral infarction: Secondary | ICD-10-CM

## 2020-07-21 DIAGNOSIS — I10 Essential (primary) hypertension: Secondary | ICD-10-CM | POA: Diagnosis not present

## 2020-07-21 DIAGNOSIS — G890 Central pain syndrome: Secondary | ICD-10-CM | POA: Diagnosis not present

## 2020-07-21 DIAGNOSIS — M5442 Lumbago with sciatica, left side: Secondary | ICD-10-CM

## 2020-07-21 DIAGNOSIS — E785 Hyperlipidemia, unspecified: Secondary | ICD-10-CM

## 2020-07-21 DIAGNOSIS — E039 Hypothyroidism, unspecified: Secondary | ICD-10-CM

## 2020-07-21 DIAGNOSIS — G8929 Other chronic pain: Secondary | ICD-10-CM

## 2020-07-21 DIAGNOSIS — G4700 Insomnia, unspecified: Secondary | ICD-10-CM

## 2020-07-21 DIAGNOSIS — F411 Generalized anxiety disorder: Secondary | ICD-10-CM

## 2020-07-21 DIAGNOSIS — E669 Obesity, unspecified: Secondary | ICD-10-CM

## 2020-07-21 DIAGNOSIS — F1120 Opioid dependence, uncomplicated: Secondary | ICD-10-CM

## 2020-07-21 DIAGNOSIS — F331 Major depressive disorder, recurrent, moderate: Secondary | ICD-10-CM

## 2020-07-21 DIAGNOSIS — R52 Pain, unspecified: Secondary | ICD-10-CM | POA: Diagnosis not present

## 2020-07-21 DIAGNOSIS — E559 Vitamin D deficiency, unspecified: Secondary | ICD-10-CM

## 2020-07-21 DIAGNOSIS — N3281 Overactive bladder: Secondary | ICD-10-CM

## 2020-07-21 MED ORDER — HYDROCODONE-ACETAMINOPHEN 10-325 MG PO TABS: 1.0000 | ORAL_TABLET | Freq: Three times a day (TID) | ORAL | 0 refills | Status: DC | PRN

## 2020-07-21 MED ORDER — HYDROCODONE-ACETAMINOPHEN 10-325 MG PO TABS: 1.0000 | ORAL_TABLET | Freq: Four times a day (QID) | ORAL | 0 refills | Status: DC | PRN

## 2020-07-21 NOTE — Progress Notes (Signed)
Subjective:    Patient ID: Allison Parker, female    DOB: Apr 07, 1941, 79 y.o.   MRN: [id number]  Chief Complaint  Patient presents with   Hypertension   Pt presents to the office today for chronic follow up and pain medication refillfor chronic back pain and thalamic pain syndrome. 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 PT states this is stable at this time. Pt is followed by Ortho annually for arthritis. Hypertension This is a chronic problem. The current episode started more than 1 year ago. The problem has been resolved since onset. The problem is controlled. Associated symptoms include anxiety. Pertinent negatives include no headaches, malaise/fatigue, peripheral edema or shortness of breath. Risk factors for coronary artery disease include diabetes mellitus, dyslipidemia, obesity and sedentary lifestyle. The current treatment provides moderate improvement. Hypertensive end-organ damage includes kidney disease and heart failure. Identifiable causes of hypertension include a thyroid problem.  Thyroid Problem Presents for follow-up visit. Symptoms include anxiety, dry skin and fatigue. The symptoms have been stable. Her past medical history is significant for heart failure and hyperlipidemia.  Urinary Frequency  This is a chronic problem. The current episode started more than 1 year ago. The problem occurs intermittently. The problem has been waxing and waning. Associated symptoms include frequency. Treatments tried: vesicare. The treatment provided moderate relief.  Insomnia Primary symptoms: difficulty falling asleep, frequent awakening, no malaise/fatigue.  PMH includes: depression.  Hyperlipidemia This is a chronic problem. The current episode started more than 1 year ago. The problem is controlled. Recent lipid tests were reviewed and are normal. Exacerbating diseases include hypothyroidism. Pertinent negatives include no shortness of  breath. Current antihyperlipidemic treatment includes statins. The current treatment provides moderate improvement of lipids. Risk factors for coronary artery disease include obesity, hypertension, a sedentary lifestyle, post-menopausal, dyslipidemia and diabetes mellitus.  Anxiety Presents for follow-up visit. Symptoms include excessive worry, insomnia, irritability, nervous/anxious behavior and restlessness. Patient reports no shortness of breath.    Depression        This is a chronic problem.  The current episode started more than 1 year ago.   The onset quality is gradual.   The problem occurs intermittently.  The problem has been waxing and waning since onset.  Associated symptoms include fatigue, insomnia, irritable and restlessness.  Associated symptoms include no headaches.  Past treatments include SSRIs - Selective serotonin reuptake inhibitors.  Compliance with treatment is good.  Past medical history includes hypothyroidism, thyroid problem and anxiety.   Back Pain This is a chronic problem. The current episode started more than 1 year ago. The problem occurs intermittently. The problem has been waxing and waning since onset. The pain is present in the lumbar spine. The pain radiates to the right thigh. The pain is at a severity of 7/10. The pain is moderate. The symptoms are aggravated by twisting and standing. Pertinent negatives include no headaches.      Review of Systems  Constitutional: Positive for fatigue and irritability. Negative for malaise/fatigue.  Respiratory: Negative for shortness of breath.   Genitourinary: Positive for frequency.  Musculoskeletal: Positive for back pain.  Neurological: Negative for headaches.  Psychiatric/Behavioral: Positive for depression. The patient is nervous/anxious and has insomnia.   All other systems reviewed and are negative.      Objective:   Physical Exam Vitals reviewed.  Constitutional:      General: She is irritable. She is not  in acute distress.  Appearance: She is well-developed.  HENT:     Head: Normocephalic and atraumatic.     Right Ear: Tympanic membrane normal.     Left Ear: Tympanic membrane normal.  Eyes:     Pupils: Pupils are equal, round, and reactive to light.  Neck:     Thyroid: No thyromegaly.  Cardiovascular:     Rate and Rhythm: Normal rate and regular rhythm.     Heart sounds: Normal heart sounds. No murmur heard.   Pulmonary:     Effort: Pulmonary effort is normal. No respiratory distress.     Breath sounds: Normal breath sounds. No wheezing.  Abdominal:     General: Bowel sounds are normal. There is no distension.     Palpations: Abdomen is soft.     Tenderness: There is no abdominal tenderness.  Musculoskeletal:        General: No tenderness.     Cervical back: Normal range of motion and neck supple.     Right lower leg: Edema (trace) present.     Comments: Pain in lumbar with flexion and extension, using cane to walk  Skin:    General: Skin is warm and dry.  Neurological:     Mental Status: She is alert and oriented to person, place, and time.     Cranial Nerves: No cranial nerve deficit.     Deep Tendon Reflexes: Reflexes are normal and symmetric.  Psychiatric:        Behavior: Behavior normal.        Thought Content: Thought content normal.        Judgment: Judgment normal.       BP 126/68    Pulse 73    Temp 97.7 F (36.5 C) (Temporal)    Ht 5' 5"  (1.651 m)    Wt 205 lb (93 kg)    SpO2 96%    BMI 34.11 kg/m      Assessment & Plan:  Allison Parker comes in today with chief complaint of Hypertension   Diagnosis and orders addressed:  1. Pain management - HYDROcodone-acetaminophen (NORCO) 10-325 MG tablet; Take 1 tablet by mouth every 6 (six) hours as needed.  Dispense: 90 tablet; Refill: 0 - HYDROcodone-acetaminophen (NORCO) 10-325 MG tablet; Take 1 tablet by mouth every 8 (eight) hours as needed.  Dispense: 90 tablet; Refill: 0 - HYDROcodone-acetaminophen  (NORCO) 10-325 MG tablet; Take 1 tablet by mouth every 8 (eight) hours as needed.  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  Dispense: 90 tablet; Refill: 0 - HYDROcodone-acetaminophen (NORCO) 10-325 MG tablet; Take 1 tablet by mouth every 8 (eight) hours as needed.  Dispense: 90 tablet; Refill: 0 - HYDROcodone-acetaminophen (NORCO) 10-325 MG tablet; Take 1 tablet by mouth every 8 (eight) hours as needed.  Dispense: 90 tablet; Refill: 0 - CMP14+EGFR - CBC with Differential/Platelet  3. Thalamic pain syndrome - HYDROcodone-acetaminophen (NORCO) 10-325 MG tablet; Take 1 tablet by mouth every 6 (six) hours as needed.  Dispense: 90 tablet; Refill: 0 - HYDROcodone-acetaminophen (NORCO) 10-325 MG tablet; Take 1 tablet by mouth every 8 (eight) hours as needed.  Dispense: 90 tablet; Refill: 0 - HYDROcodone-acetaminophen (NORCO) 10-325 MG tablet; Take 1 tablet by mouth every 8 (eight) hours as needed.  Dispense: 90 tablet; Refill: 0 - CMP14+EGFR - CBC with Differential/Platelet  4. Pain  medication agreement signed - HYDROcodone-acetaminophen (NORCO) 10-325 MG tablet; Take 1 tablet by mouth every 6 (six) hours as needed.  Dispense: 90 tablet; Refill: 0 - HYDROcodone-acetaminophen (NORCO) 10-325 MG tablet; Take 1 tablet by mouth every 8 (eight) hours as needed.  Dispense: 90 tablet; Refill: 0 - HYDROcodone-acetaminophen (NORCO) 10-325 MG tablet; Take 1 tablet by mouth every 8 (eight) hours as needed.  Dispense: 90 tablet; Refill: 0 - CMP14+EGFR - CBC with Differential/Platelet  5. Uncomplicated opioid dependence (HCC) - HYDROcodone-acetaminophen (NORCO) 10-325 MG tablet; Take 1 tablet by mouth every 6 (six) hours as needed.  Dispense: 90 tablet; Refill: 0 - HYDROcodone-acetaminophen (NORCO) 10-325 MG  tablet; Take 1 tablet by mouth every 8 (eight) hours as needed.  Dispense: 90 tablet; Refill: 0 - HYDROcodone-acetaminophen (NORCO) 10-325 MG tablet; Take 1 tablet by mouth every 8 (eight) hours as needed.  Dispense: 90 tablet; Refill: 0 - CMP14+EGFR - CBC with Differential/Platelet  6. Essential hypertension, benign  - CMP14+EGFR - CBC with Differential/Platelet  7. Hypothyroidism, unspecified type - CMP14+EGFR - CBC with Differential/Platelet - TSH  8. Overactive bladder - CMP14+EGFR - CBC with Differential/Platelet  9. Moderate episode of recurrent major depressive disorder (HCC) - CMP14+EGFR - CBC with Differential/Platelet  10. GAD (generalized anxiety disorder) - CMP14+EGFR - CBC with Differential/Platelet  11. Hyperlipidemia, unspecified hyperlipidemia type - CMP14+EGFR - CBC with Differential/Platelet  12. Vitamin D deficiency - CMP14+EGFR - CBC with Differential/Platelet  13. Obesity (BMI 30-39.9) - CMP14+EGFR - CBC with Differential/Platelet  14. Late effect of cerebrovascular accident - CMP14+EGFR - CBC with Differential/Platelet  15. Insomnia, unspecified type - CMP14+EGFR - CBC with Differential/Platelet   Labs pending Patient reviewed in [place] controlled database, no flags noted. Contract and drug screen are up to date.  Health Maintenance reviewed Diet and exercise encouraged  Follow up plan: 3 months    Evelina Dun, FNP

## 2020-07-21 NOTE — Patient Instructions (Signed)
Health Maintenance After Age 77 After age 77, you are at a higher risk for certain long-term diseases and infections as well as injuries from falls. Falls are a major cause of broken bones and head injuries in people who are older than age 77. Getting regular preventive care can help to keep you healthy and well. Preventive care includes getting regular testing and making lifestyle changes as recommended by your health care provider. Talk with your health care provider about:  Which screenings and tests you should have. A screening is a test that checks for a disease when you have no symptoms.  A diet and exercise plan that is right for you. What should I know about screenings and tests to prevent falls? Screening and testing are the best ways to find a health problem early. Early diagnosis and treatment give you the best chance of managing medical conditions that are common after age 77. Certain conditions and lifestyle choices may make you more likely to have a fall. Your health care provider may recommend:  Regular vision checks. Poor vision and conditions such as cataracts can make you more likely to have a fall. If you wear glasses, make sure to get your prescription updated if your vision changes.  Medicine review. Work with your health care provider to regularly review all of the medicines you are taking, including over-the-counter medicines. Ask your health care provider about any side effects that may make you more likely to have a fall. Tell your health care provider if any medicines that you take make you feel dizzy or sleepy.  Osteoporosis screening. Osteoporosis is a condition that causes the bones to get weaker. This can make the bones weak and cause them to break more easily.  Blood pressure screening. Blood pressure changes and medicines to control blood pressure can make you feel dizzy.  Strength and balance checks. Your health care provider may recommend certain tests to check your  strength and balance while standing, walking, or changing positions.  Foot health exam. Foot pain and numbness, as well as not wearing proper footwear, can make you more likely to have a fall.  Depression screening. You may be more likely to have a fall if you have a fear of falling, feel emotionally low, or feel unable to do activities that you used to do.  Alcohol use screening. Using too much alcohol can affect your balance and may make you more likely to have a fall. What actions can I take to lower my risk of falls? General instructions  Talk with your health care provider about your risks for falling. Tell your health care provider if: ? You fall. Be sure to tell your health care provider about all falls, even ones that seem minor. ? You feel dizzy, sleepy, or off-balance.  Take over-the-counter and prescription medicines only as told by your health care provider. These include any supplements.  Eat a healthy diet and maintain a healthy weight. A healthy diet includes low-fat dairy products, low-fat (lean) meats, and fiber from whole grains, beans, and lots of fruits and vegetables. Home safety  Remove any tripping hazards, such as rugs, cords, and clutter.  Install safety equipment such as grab bars in bathrooms and safety rails on stairs.  Keep rooms and walkways well-lit. Activity   Follow a regular exercise program to stay fit. This will help you maintain your balance. Ask your health care provider what types of exercise are appropriate for you.  If you need a cane or   walker, use it as recommended by your health care provider.  Wear supportive shoes that have nonskid soles. Lifestyle  Do not drink alcohol if your health care provider tells you not to drink.  If you drink alcohol, limit how much you have: ? 0-1 drink a day for women. ? 0-2 drinks a day for men.  Be aware of how much alcohol is in your drink. In the [place] one drink equals one typical bottle of beer (12  oz), one-half glass of wine (5 oz), or one shot of hard liquor (1 oz).  Do not use any products that contain nicotine or tobacco, such as cigarettes and e-cigarettes. If you need help quitting, ask your health care provider. Summary  Having a healthy lifestyle and getting preventive care can help to protect your health and wellness after age 77.  Screening and testing are the best way to find a health problem early and help you avoid having a fall. Early diagnosis and treatment give you the best chance for managing medical conditions that are more common for people who are older than age 77.  Falls are a major cause of broken bones and head injuries in people who are older than age 77. Take precautions to prevent a fall at home.  Work with your health care provider to learn what changes you can make to improve your health and wellness and to prevent falls. This information is not intended to replace advice given to you by your health care provider. Make sure you discuss any questions you have with your health care provider. Document Revised: 02/01/2019 Document Reviewed: 08/24/2017 Elsevier Patient Education  2020 [employer]  

## 2020-07-22 LAB — CBC WITH DIFFERENTIAL/PLATELET
Basophils Absolute: 0 10*3/uL (ref 0.0–0.2)
Basos: 1 %
EOS (ABSOLUTE): 0.2 10*3/uL (ref 0.0–0.4)
Eos: 2 %
Hematocrit: 39.4 % (ref 34.0–46.6)
Hemoglobin: 12.9 g/dL (ref 11.1–15.9)
Immature Grans (Abs): 0 10*3/uL (ref 0.0–0.1)
Immature Granulocytes: 0 %
Lymphocytes Absolute: 1.7 10*3/uL (ref 0.7–3.1)
Lymphs: 26 %
MCH: 30.1 pg (ref 26.6–33.0)
MCHC: 32.7 g/dL (ref 31.5–35.7)
MCV: 92 fL (ref 79–97)
Monocytes Absolute: 0.8 10*3/uL (ref 0.1–0.9)
Monocytes: 12 %
Neutrophils Absolute: 3.9 10*3/uL (ref 1.4–7.0)
Neutrophils: 59 %
Platelets: 260 10*3/uL (ref 150–450)
RBC: 4.28 x10E6/uL (ref 3.77–5.28)
RDW: 12.7 % (ref 11.7–15.4)
WBC: 6.6 10*3/uL (ref 3.4–10.8)

## 2020-07-22 LAB — CMP14+EGFR
ALT: 15 IU/L (ref 0–32)
AST: 14 IU/L (ref 0–40)
Albumin/Globulin Ratio: 1.8 (ref 1.2–2.2)
Albumin: 4.3 g/dL (ref 3.7–4.7)
Alkaline Phosphatase: 129 IU/L — ABNORMAL HIGH (ref 44–121)
BUN/Creatinine Ratio: 25 (ref 12–28)
BUN: 21 mg/dL (ref 8–27)
Bilirubin Total: 0.3 mg/dL (ref 0.0–1.2)
CO2: 25 mmol/L (ref 20–29)
Calcium: 9.7 mg/dL (ref 8.7–10.3)
Chloride: 99 mmol/L (ref 96–106)
Creatinine, Ser: 0.83 mg/dL (ref 0.57–1.00)
GFR calc Af Amer: 79 mL/min/{1.73_m2} (ref >59)
GFR calc non Af Amer: 69 mL/min/{1.73_m2} (ref >59)
Globulin, Total: 2.4 g/dL (ref 1.5–4.5)
Glucose: 88 mg/dL (ref 65–99)
Potassium: 4.7 mmol/L (ref 3.5–5.2)
Sodium: 138 mmol/L (ref 134–144)
Total Protein: 6.7 g/dL (ref 6.0–8.5)

## 2020-07-22 LAB — TSH: TSH: 1.74 u[IU]/mL (ref 0.450–4.500)

## 2020-10-02 ENCOUNTER — Other Ambulatory Visit: Payer: Self-pay | Admitting: Family

## 2020-10-02 DIAGNOSIS — I10 Essential (primary) hypertension: Secondary | ICD-10-CM

## 2020-10-02 DIAGNOSIS — F331 Major depressive disorder, recurrent, moderate: Secondary | ICD-10-CM

## 2020-10-02 DIAGNOSIS — F411 Generalized anxiety disorder: Secondary | ICD-10-CM

## 2020-10-20 ENCOUNTER — Encounter: Payer: Self-pay | Admitting: Family

## 2020-10-20 ENCOUNTER — Other Ambulatory Visit: Payer: Self-pay

## 2020-10-20 ENCOUNTER — Ambulatory Visit (INDEPENDENT_AMBULATORY_CARE_PROVIDER_SITE_OTHER): Payer: Medicare Other | Admitting: Family

## 2020-10-20 ENCOUNTER — Telehealth: Payer: Self-pay

## 2020-10-20 VITALS — BP 138/73 | HR 84 | Temp 97.6°F | Ht 65.0 in | Wt 214.4 lb

## 2020-10-20 DIAGNOSIS — G8929 Other chronic pain: Secondary | ICD-10-CM

## 2020-10-20 DIAGNOSIS — E785 Hyperlipidemia, unspecified: Secondary | ICD-10-CM

## 2020-10-20 DIAGNOSIS — R52 Pain, unspecified: Secondary | ICD-10-CM | POA: Diagnosis not present

## 2020-10-20 DIAGNOSIS — G890 Central pain syndrome: Secondary | ICD-10-CM | POA: Diagnosis not present

## 2020-10-20 DIAGNOSIS — F411 Generalized anxiety disorder: Secondary | ICD-10-CM

## 2020-10-20 DIAGNOSIS — G4700 Insomnia, unspecified: Secondary | ICD-10-CM

## 2020-10-20 DIAGNOSIS — F1120 Opioid dependence, uncomplicated: Secondary | ICD-10-CM

## 2020-10-20 DIAGNOSIS — I6930 Unspecified sequelae of cerebral infarction: Secondary | ICD-10-CM

## 2020-10-20 DIAGNOSIS — F331 Major depressive disorder, recurrent, moderate: Secondary | ICD-10-CM

## 2020-10-20 DIAGNOSIS — N3281 Overactive bladder: Secondary | ICD-10-CM

## 2020-10-20 DIAGNOSIS — Z0289 Encounter for other administrative examinations: Secondary | ICD-10-CM | POA: Diagnosis not present

## 2020-10-20 DIAGNOSIS — E669 Obesity, unspecified: Secondary | ICD-10-CM

## 2020-10-20 DIAGNOSIS — E559 Vitamin D deficiency, unspecified: Secondary | ICD-10-CM

## 2020-10-20 DIAGNOSIS — Z23 Encounter for immunization: Secondary | ICD-10-CM | POA: Diagnosis not present

## 2020-10-20 DIAGNOSIS — M5442 Lumbago with sciatica, left side: Secondary | ICD-10-CM | POA: Diagnosis not present

## 2020-10-20 DIAGNOSIS — I10 Essential (primary) hypertension: Secondary | ICD-10-CM

## 2020-10-20 DIAGNOSIS — E039 Hypothyroidism, unspecified: Secondary | ICD-10-CM

## 2020-10-20 MED ORDER — HYDROCODONE-ACETAMINOPHEN 10-325 MG PO TABS: 1.0000 | ORAL_TABLET | Freq: Four times a day (QID) | ORAL | 0 refills | Status: DC | PRN

## 2020-10-20 MED ORDER — HYDROCODONE-ACETAMINOPHEN 10-325 MG PO TABS: 1.0000 | ORAL_TABLET | Freq: Three times a day (TID) | ORAL | 0 refills | Status: DC | PRN

## 2020-10-20 MED ORDER — ATORVASTATIN CALCIUM 20 MG PO TABS: 20.0000 mg | ORAL_TABLET | Freq: Every day | ORAL | 1 refills | Status: DC

## 2020-10-20 MED ORDER — LEVOTHYROXINE SODIUM 25 MCG PO TABS: 25.0000 ug | ORAL_TABLET | Freq: Every day | ORAL | 2 refills | Status: DC

## 2020-10-20 MED ORDER — MELOXICAM 7.5 MG PO TABS: 7.5000 mg | ORAL_TABLET | Freq: Every day | ORAL | 3 refills | Status: DC

## 2020-10-20 MED ORDER — CITALOPRAM HYDROBROMIDE 40 MG PO TABS: 40.0000 mg | ORAL_TABLET | Freq: Every day | ORAL | 3 refills | Status: DC

## 2020-10-20 MED ORDER — SOLIFENACIN SUCCINATE 5 MG PO TABS: 5.0000 mg | ORAL_TABLET | Freq: Every day | ORAL | 3 refills | Status: DC

## 2020-10-20 MED ORDER — AMLODIPINE BESYLATE 5 MG PO TABS: 5.0000 mg | ORAL_TABLET | Freq: Every day | ORAL | 1 refills | Status: DC

## 2020-10-20 NOTE — Patient Instructions (Signed)
Health Maintenance After Age 77 After age 77, you are at a higher risk for certain long-term diseases and infections as well as injuries from falls. Falls are a major cause of broken bones and head injuries in people who are older than age 77. Getting regular preventive care can help to keep you healthy and well. Preventive care includes getting regular testing and making lifestyle changes as recommended by your health care provider. Talk with your health care provider about:  Which screenings and tests you should have. A screening is a test that checks for a disease when you have no symptoms.  A diet and exercise plan that is right for you. What should I know about screenings and tests to prevent falls? Screening and testing are the best ways to find a health problem early. Early diagnosis and treatment give you the best chance of managing medical conditions that are common after age 77. Certain conditions and lifestyle choices may make you more likely to have a fall. Your health care provider may recommend:  Regular vision checks. Poor vision and conditions such as cataracts can make you more likely to have a fall. If you wear glasses, make sure to get your prescription updated if your vision changes.  Medicine review. Work with your health care provider to regularly review all of the medicines you are taking, including over-the-counter medicines. Ask your health care provider about any side effects that may make you more likely to have a fall. Tell your health care provider if any medicines that you take make you feel dizzy or sleepy.  Osteoporosis screening. Osteoporosis is a condition that causes the bones to get weaker. This can make the bones weak and cause them to break more easily.  Blood pressure screening. Blood pressure changes and medicines to control blood pressure can make you feel dizzy.  Strength and balance checks. Your health care provider may recommend certain tests to check your  strength and balance while standing, walking, or changing positions.  Foot health exam. Foot pain and numbness, as well as not wearing proper footwear, can make you more likely to have a fall.  Depression screening. You may be more likely to have a fall if you have a fear of falling, feel emotionally low, or feel unable to do activities that you used to do.  Alcohol use screening. Using too much alcohol can affect your balance and may make you more likely to have a fall. What actions can I take to lower my risk of falls? General instructions  Talk with your health care provider about your risks for falling. Tell your health care provider if: ? You fall. Be sure to tell your health care provider about all falls, even ones that seem minor. ? You feel dizzy, sleepy, or off-balance.  Take over-the-counter and prescription medicines only as told by your health care provider. These include any supplements.  Eat a healthy diet and maintain a healthy weight. A healthy diet includes low-fat dairy products, low-fat (lean) meats, and fiber from whole grains, beans, and lots of fruits and vegetables. Home safety  Remove any tripping hazards, such as rugs, cords, and clutter.  Install safety equipment such as grab bars in bathrooms and safety rails on stairs.  Keep rooms and walkways well-lit. Activity   Follow a regular exercise program to stay fit. This will help you maintain your balance. Ask your health care provider what types of exercise are appropriate for you.  If you need a cane or   walker, use it as recommended by your health care provider.  Wear supportive shoes that have nonskid soles. Lifestyle  Do not drink alcohol if your health care provider tells you not to drink.  If you drink alcohol, limit how much you have: ? 0-1 drink a day for women. ? 0-2 drinks a day for men.  Be aware of how much alcohol is in your drink. In the [place] one drink equals one typical bottle of beer (12  oz), one-half glass of wine (5 oz), or one shot of hard liquor (1 oz).  Do not use any products that contain nicotine or tobacco, such as cigarettes and e-cigarettes. If you need help quitting, ask your health care provider. Summary  Having a healthy lifestyle and getting preventive care can help to protect your health and wellness after age 77.  Screening and testing are the best way to find a health problem early and help you avoid having a fall. Early diagnosis and treatment give you the best chance for managing medical conditions that are more common for people who are older than age 77.  Falls are a major cause of broken bones and head injuries in people who are older than age 77. Take precautions to prevent a fall at home.  Work with your health care provider to learn what changes you can make to improve your health and wellness and to prevent falls. This information is not intended to replace advice given to you by your health care provider. Make sure you discuss any questions you have with your health care provider. Document Revised: 02/01/2019 Document Reviewed: 08/24/2017 Elsevier Patient Education  2020 [employer]  

## 2020-10-20 NOTE — Progress Notes (Signed)
Subjective:    Patient ID: Allison Parker, female    DOB: 09/16/1941, 79 y.o.   MRN: [id number]  Chief Complaint  Patient presents with  . Hypertension   Pt presents to the office today for chronic follow up and pain medication refillfor chronic back pain and thalamic pain syndrome. Pt has a CVA in and has right sidedweakness andtremors associated from her CVA. PT states this is stable at this time. Pt is followed by Ortho annually for arthritis. Hypertension This is a chronic problem. The current episode started more than 1 year ago. The problem has been resolved since onset. The problem is controlled. Associated symptoms include anxiety and malaise/fatigue. Pertinent negatives include no peripheral edema or shortness of breath. Risk factors for coronary artery disease include dyslipidemia, obesity and sedentary lifestyle. The current treatment provides moderate improvement. Identifiable causes of hypertension include a thyroid problem.  Thyroid Problem Presents for follow-up visit. Symptoms include fatigue. Patient reports no constipation, depressed mood, diaphoresis or hoarse voice. The symptoms have been stable.  Urinary Frequency  This is a chronic problem. The current episode started more than 1 year ago. The problem occurs intermittently. The problem has been waxing and waning. The pain is at a severity of 0/10. The patient is experiencing no pain. Associated symptoms include frequency. The treatment provided no relief.  Anxiety Presents for follow-up visit. Symptoms include excessive worry, irritability and restlessness. Patient reports no depressed mood or shortness of breath. Symptoms occur most days. The severity of symptoms is moderate.    Depression        This is a chronic problem.  The current episode started more than 1 year ago.   The onset quality is gradual.   The problem occurs intermittently.  Associated symptoms include fatigue, irritable and restlessness.  Past  medical history includes thyroid problem and anxiety.   Back Pain This is a chronic problem. The current episode started more than 1 year ago. The problem occurs intermittently. The pain is present in the lumbar spine. The pain is at a severity of 7/10. The pain is moderate.      Review of Systems  Constitutional: Positive for fatigue, irritability and malaise/fatigue. Negative for diaphoresis.  HENT: Negative for hoarse voice.   Respiratory: Negative for shortness of breath.   Gastrointestinal: Negative for constipation.  Genitourinary: Positive for frequency.  Musculoskeletal: Positive for back pain.  Psychiatric/Behavioral: Positive for depression.  All other systems reviewed and are negative.      Objective:   Physical Exam Vitals reviewed.  Constitutional:      General: She is irritable. She is not in acute distress.    Appearance: She is well-developed and well-nourished.  HENT:     Head: Normocephalic and atraumatic.     Right Ear: Tympanic membrane normal.     Left Ear: Tympanic membrane normal.     Mouth/Throat:     Mouth: Oropharynx is clear and moist.  Eyes:     Pupils: Pupils are equal, round, and reactive to light.  Neck:     Thyroid: No thyromegaly.  Cardiovascular:     Rate and Rhythm: Normal rate and regular rhythm.     Pulses: Intact distal pulses.     Heart sounds: Normal heart sounds. No murmur heard.   Pulmonary:     Effort: Pulmonary effort is normal. No respiratory distress.     Breath sounds: Normal breath sounds. No wheezing.  Abdominal:     General: Bowel sounds are normal.  There is no distension.     Palpations: Abdomen is soft.     Tenderness: There is no abdominal tenderness.  Musculoskeletal:        General: No tenderness or edema.     Cervical back: Normal range of motion and neck supple.     Comments: Right sided weakness  Skin:    General: Skin is warm and dry.  Neurological:     Mental Status: She is alert and oriented to person,  place, and time.     Cranial Nerves: No cranial nerve deficit.     Motor: Weakness present.     Gait: Gait abnormal.     Deep Tendon Reflexes: Reflexes are normal and symmetric.  Psychiatric:        Mood and Affect: Mood and affect normal.        Behavior: Behavior normal.        Thought Content: Thought content normal.        Judgment: Judgment normal.       BP 138/73   Pulse 84   Temp 97.6 F (36.4 C) (Temporal)   Ht 5\' 5"  (1.651 m)   Wt 214 lb 6.4 oz (97.3 kg)   BMI 35.68 kg/m      Assessment & Plan:  Allison Parker comes in today with chief complaint of Hypertension   Diagnosis and orders addressed:  1. Pain management - HYDROcodone-acetaminophen (NORCO) 10-325 MG tablet; Take 1 tablet by mouth every 6 (six) hours as needed.  Dispense: 90 tablet; Refill: 0 - HYDROcodone-acetaminophen (NORCO) 10-325 MG tablet; Take 1 tablet by mouth every 8 (eight) hours as needed.  Dispense: 90 tablet; Refill: 0 - HYDROcodone-acetaminophen (NORCO) 10-325 MG tablet; Take 1 tablet by mouth every 8 (eight) hours as needed.  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  Dispense: 90 tablet; Refill: 0 - HYDROcodone-acetaminophen (NORCO) 10-325 MG tablet; Take 1 tablet by mouth every 8 (eight) hours as needed.  Dispense: 90 tablet; Refill: 0 - HYDROcodone-acetaminophen (NORCO) 10-325 MG tablet; Take 1 tablet by mouth every 8 (eight) hours as needed.  Dispense: 90 tablet; Refill: 0 - meloxicam (MOBIC) 7.5 MG tablet; Take 1 tablet (7.5 mg total) by mouth daily.  Dispense: 90 tablet; Refill: 3  3. Thalamic pain syndrome - HYDROcodone-acetaminophen (NORCO) 10-325 MG tablet; Take 1 tablet by mouth every 6 (six) hours as needed.  Dispense: 90 tablet; Refill: 0 - HYDROcodone-acetaminophen (NORCO) 10-325 MG tablet; Take 1 tablet by mouth every 8  (eight) hours as needed.  Dispense: 90 tablet; Refill: 0 - HYDROcodone-acetaminophen (NORCO) 10-325 MG tablet; Take 1 tablet by mouth every 8 (eight) hours as needed.  Dispense: 90 tablet; Refill: 0 - meloxicam (MOBIC) 7.5 MG tablet; Take 1 tablet (7.5 mg total) by mouth daily.  Dispense: 90 tablet; Refill: 3  4. Pain medication agreement signed - HYDROcodone-acetaminophen (NORCO) 10-325 MG tablet; Take 1 tablet by mouth every 6 (six) hours as needed.  Dispense: 90 tablet; Refill: 0 - HYDROcodone-acetaminophen (NORCO) 10-325 MG tablet; Take 1 tablet by mouth every 8 (eight) hours as needed.  Dispense: 90 tablet; Refill: 0 - HYDROcodone-acetaminophen (NORCO) 10-325 MG tablet; Take 1 tablet by mouth every 8 (eight) hours as needed.  Dispense: 90 tablet; Refill: 0  5. Uncomplicated opioid dependence (HCC) - HYDROcodone-acetaminophen (NORCO) 10-325 MG tablet; Take 1 tablet by mouth every  6 (six) hours as needed.  Dispense: 90 tablet; Refill: 0 - HYDROcodone-acetaminophen (NORCO) 10-325 MG tablet; Take 1 tablet by mouth every 8 (eight) hours as needed.  Dispense: 90 tablet; Refill: 0 - HYDROcodone-acetaminophen (NORCO) 10-325 MG tablet; Take 1 tablet by mouth every 8 (eight) hours as needed.  Dispense: 90 tablet; Refill: 0  6. Essential hypertension, benign - amLODipine (NORVASC) 5 MG tablet; Take 1 tablet (5 mg total) by mouth daily.  Dispense: 90 tablet; Refill: 1  7. GAD (generalized anxiety disorder) - citalopram (CELEXA) 40 MG tablet; Take 1 tablet (40 mg total) by mouth daily.  Dispense: 90 tablet; Refill: 3  8. Moderate episode of recurrent major depressive disorder (HCC) - citalopram (CELEXA) 40 MG tablet; Take 1 tablet (40 mg total) by mouth daily.  Dispense: 90 tablet; Refill: 3  9. Hyperlipidemia - atorvastatin (LIPITOR) 20 MG tablet; Take 1 tablet (20 mg total) by mouth daily.  Dispense: 90 tablet; Refill: 1  10. Hypothyroidism, unspecified type - levothyroxine (SYNTHROID) 25 MCG  tablet; Take 1 tablet (25 mcg total) by mouth daily.  Dispense: 90 tablet; Refill: 2  11. Overactive bladder - solifenacin (VESICARE) 5 MG tablet; Take 1 tablet (5 mg total) by mouth daily.  Dispense: 90 tablet; Refill: 3  12. Need for immunization against influenza - Flu Vaccine QUAD High Dose(Fluad)  13. Insomnia, unspecified type  14. Late effect of cerebrovascular accident  13. Vitamin D deficiency  16. Obesity (BMI 30-39.9)    Patient reviewed in [place] controlled database, no flags noted. Contract and drug screen are up to date.  Health Maintenance reviewed Diet and exercise encouraged  Follow up plan: 3 months   Jannifer Rodney, FNP

## 2021-01-15 ENCOUNTER — Encounter: Payer: Self-pay | Admitting: Family

## 2021-01-15 ENCOUNTER — Ambulatory Visit (INDEPENDENT_AMBULATORY_CARE_PROVIDER_SITE_OTHER): Payer: Medicare Other | Admitting: Family

## 2021-01-15 DIAGNOSIS — F331 Major depressive disorder, recurrent, moderate: Secondary | ICD-10-CM

## 2021-01-15 DIAGNOSIS — I6930 Unspecified sequelae of cerebral infarction: Secondary | ICD-10-CM

## 2021-01-15 DIAGNOSIS — M5442 Lumbago with sciatica, left side: Secondary | ICD-10-CM

## 2021-01-15 DIAGNOSIS — I10 Essential (primary) hypertension: Secondary | ICD-10-CM

## 2021-01-15 DIAGNOSIS — E785 Hyperlipidemia, unspecified: Secondary | ICD-10-CM

## 2021-01-15 DIAGNOSIS — E039 Hypothyroidism, unspecified: Secondary | ICD-10-CM

## 2021-01-15 DIAGNOSIS — R52 Pain, unspecified: Secondary | ICD-10-CM | POA: Diagnosis not present

## 2021-01-15 DIAGNOSIS — G890 Central pain syndrome: Secondary | ICD-10-CM

## 2021-01-15 DIAGNOSIS — F411 Generalized anxiety disorder: Secondary | ICD-10-CM

## 2021-01-15 DIAGNOSIS — G8929 Other chronic pain: Secondary | ICD-10-CM

## 2021-01-15 DIAGNOSIS — N3281 Overactive bladder: Secondary | ICD-10-CM

## 2021-01-15 DIAGNOSIS — F1120 Opioid dependence, uncomplicated: Secondary | ICD-10-CM

## 2021-01-15 DIAGNOSIS — Z0289 Encounter for other administrative examinations: Secondary | ICD-10-CM

## 2021-01-15 DIAGNOSIS — E669 Obesity, unspecified: Secondary | ICD-10-CM

## 2021-01-15 DIAGNOSIS — G4700 Insomnia, unspecified: Secondary | ICD-10-CM

## 2021-01-15 MED ORDER — HYDROCODONE-ACETAMINOPHEN 10-325 MG PO TABS: 1.0000 | ORAL_TABLET | Freq: Three times a day (TID) | ORAL | 0 refills | Status: DC | PRN

## 2021-01-15 MED ORDER — HYDROCODONE-ACETAMINOPHEN 10-325 MG PO TABS: 1.0000 | ORAL_TABLET | Freq: Four times a day (QID) | ORAL | 0 refills | Status: DC | PRN

## 2021-01-15 NOTE — Addendum Note (Signed)
Addended by: Evelina Dun A on: 01/15/2021 04:43 PM   Modules accepted: Orders

## 2021-01-15 NOTE — Progress Notes (Signed)
Virtual Visit via telephone Note Due to COVID-19 pandemic this visit was conducted virtually. This visit type was conducted due to national recommendations for restrictions regarding the COVID-19 Pandemic (e.g. social distancing, sheltering in place) in an effort to limit this patient's exposure and mitigate transmission in our community. All issues noted in this document were discussed and addressed.  A physical exam was not performed with this format.  I connected with Allison Parker on 01/15/21 at 12:52 pm  by telephone and verified that I am speaking with the correct person using two identifiers. Allison Parker is currently located at home and no one is currently with her during visit. The provider, Evelina Dun, FNP is located in their office at time of visit.  I discussed the limitations, risks, security and privacy concerns of performing an evaluation and management service by telephone and the availability of in person appointments. I also discussed with the patient that there may be a patient responsible charge related to this service. The patient expressed understanding and agreed to proceed.   History and Present Illness:  Pt calls  the office today for chronic follow up and pain medication refillfor chronic back pain and thalamic pain syndrome. Pt has a CVA in and has right sidedweakness andtremors associated from her CVA. PT states this is stable at this time. Pt is followed by Ortho annually for arthritis. Hypertension This is a chronic problem. The current episode started more than 1 year ago. The problem has been resolved since onset. The problem is controlled. Associated symptoms include peripheral edema. Pertinent negatives include no malaise/fatigue or shortness of breath. Risk factors for coronary artery disease include obesity and sedentary lifestyle. The current treatment provides moderate improvement. Identifiable causes of hypertension include a thyroid problem.  Thyroid  Problem Presents for follow-up visit. Patient reports no constipation, diarrhea, fatigue, hoarse voice or leg swelling. The symptoms have been stable. Her past medical history is significant for hyperlipidemia.  Urinary Frequency  This is a chronic problem. The current episode started more than 1 year ago. The problem occurs intermittently. The problem has been waxing and waning. The patient is experiencing no pain. Associated symptoms include frequency.  Insomnia Primary symptoms: difficulty falling asleep, frequent awakening, no malaise/fatigue.  The current episode started more than one year. The onset quality is gradual. The problem occurs intermittently.  Hyperlipidemia This is a chronic problem. The current episode started more than 1 year ago. The problem is controlled. Pertinent negatives include no shortness of breath. Current antihyperlipidemic treatment includes statins. The current treatment provides moderate improvement of lipids. Risk factors for coronary artery disease include dyslipidemia and post-menopausal.  Back Pain This is a chronic problem. The current episode started more than 1 year ago. The problem occurs intermittently. The problem has been waxing and waning since onset. The pain is present in the lumbar spine. The quality of the pain is described as aching. The pain radiates to the left thigh. The pain is at a severity of 7/10. Associated symptoms include weakness. She has tried analgesics for the symptoms. The treatment provided moderate relief.      Review of Systems  Constitutional: Negative for fatigue and malaise/fatigue.  HENT: Negative for hoarse voice.   Respiratory: Negative for shortness of breath.   Gastrointestinal: Negative for constipation and diarrhea.  Genitourinary: Positive for frequency.  Musculoskeletal: Positive for back pain.  Neurological: Positive for weakness.  Psychiatric/Behavioral: The patient has insomnia.   All other systems reviewed  and are  negative.    Observations/Objective: No SOB Or distress noted   Assessment and Plan: 1. Pain management - HYDROcodone-acetaminophen (NORCO) 10-325 MG tablet; Take 1 tablet by mouth every 6 (six) hours as needed.  Dispense: 90 tablet; Refill: 0 - HYDROcodone-acetaminophen (NORCO) 10-325 MG tablet; Take 1 tablet by mouth every 8 (eight) hours as needed.  Dispense: 90 tablet; Refill: 0 - HYDROcodone-acetaminophen (NORCO) 10-325 MG tablet; Take 1 tablet by mouth every 8 (eight) hours as needed.  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  Dispense: 90 tablet; Refill: 0 - HYDROcodone-acetaminophen (NORCO) 10-325 MG tablet; Take 1 tablet by mouth every 8 (eight) hours as needed.  Dispense: 90 tablet; Refill: 0 - HYDROcodone-acetaminophen (NORCO) 10-325 MG tablet; Take 1 tablet by mouth every 8 (eight) hours as needed.  Dispense: 90 tablet; Refill: 0  3. Thalamic pain syndrome - HYDROcodone-acetaminophen (NORCO) 10-325 MG tablet; Take 1 tablet by mouth every 6 (six) hours as needed.  Dispense: 90 tablet; Refill: 0 - HYDROcodone-acetaminophen (NORCO) 10-325 MG tablet; Take 1 tablet by mouth every 8 (eight) hours as needed.  Dispense: 90 tablet; Refill: 0 - HYDROcodone-acetaminophen (NORCO) 10-325 MG tablet; Take 1 tablet by mouth every 8 (eight) hours as needed.  Dispense: 90 tablet; Refill: 0  4. Pain medication agreement signed - HYDROcodone-acetaminophen (NORCO) 10-325 MG tablet; Take 1 tablet by mouth every 6 (six) hours as needed.  Dispense: 90 tablet; Refill: 0 - HYDROcodone-acetaminophen (NORCO) 10-325 MG tablet; Take 1 tablet by mouth every 8 (eight) hours as needed.  Dispense: 90 tablet; Refill: 0 - HYDROcodone-acetaminophen (NORCO) 10-325 MG tablet; Take 1 tablet by mouth every 8 (eight) hours as needed.  Dispense: 90 tablet;  Refill: 0  5. Uncomplicated opioid dependence (HCC) - HYDROcodone-acetaminophen (NORCO) 10-325 MG tablet; Take 1 tablet by mouth every 6 (six) hours as needed.  Dispense: 90 tablet; Refill: 0 - HYDROcodone-acetaminophen (NORCO) 10-325 MG tablet; Take 1 tablet by mouth every 8 (eight) hours as needed.  Dispense: 90 tablet; Refill: 0 - HYDROcodone-acetaminophen (NORCO) 10-325 MG tablet; Take 1 tablet by mouth every 8 (eight) hours as needed.  Dispense: 90 tablet; Refill: 0  6. Essential hypertension, benign  7. Hypothyroidism, unspecified type  8. Overactive bladder  9. Late effect of cerebrovascular accident  33. Hyperlipidemia, unspecified hyperlipidemia type  11. Insomnia, unspecified typ  12. GAD (generalized anxiety disorder)  13. Moderate episode of recurrent major depressive disorder ([place])  14. Obesity (BMI 30-39.9)  Patient reviewed in [place] controlled database, no flags noted. Contract and drug screen are up to date. Will update on next visit.  Continue current medications.   Follow Up Instructions: 3 months     I discussed the assessment and treatment plan with the patient. The patient was provided an opportunity to ask questions and all were answered. The patient agreed with the plan and demonstrated an understanding of the instructions.   The patient was advised to call back or seek an in-person evaluation if the symptoms worsen or if the condition fails to improve as anticipated.  The above assessment and management plan was discussed with the patient. The patient verbalized understanding of and has agreed to the management plan. Patient is aware to call the clinic if symptoms persist or worsen. Patient is aware when to return to the clinic for a follow-up visit. Patient educated on when it is  appropriate to go to the emergency department.   Time call ended:  1:13 pm   I provided 21 minutes of non-face-to-face time during this encounter.    Evelina Dun, FNP

## 2021-01-16 ENCOUNTER — Ambulatory Visit: Payer: Medicare Other | Admitting: Family

## 2021-02-09 ENCOUNTER — Ambulatory Visit: Payer: Medicare Other | Admitting: Family

## 2021-04-16 ENCOUNTER — Ambulatory Visit (INDEPENDENT_AMBULATORY_CARE_PROVIDER_SITE_OTHER): Payer: Medicare Other

## 2021-04-16 VITALS — Ht 65.0 in | Wt 185.0 lb

## 2021-04-16 DIAGNOSIS — Z0000 Encounter for general adult medical examination without abnormal findings: Secondary | ICD-10-CM

## 2021-04-16 NOTE — Progress Notes (Signed)
Subjective:   Allison Parker is a 80 y.o. female who presents for Medicare Annual (Subsequent) preventive examination.  Virtual Visit via Telephone Note  I connected with  Allison Parker on 04/16/21 at  3:30 PM EDT by telephone and verified that I am speaking with the correct person using two identifiers.  Location: Patient: Home Provider: [facility] Persons participating in the virtual visit: patient/Nurse Health Advisor   I discussed the limitations, risks, security and privacy concerns of performing an evaluation and management service by telephone and the availability of in person appointments. The patient expressed understanding and agreed to proceed.  Interactive audio and video telecommunications were attempted between this nurse and patient, however failed, due to patient having technical difficulties OR patient did not have access to video capability.  We continued and completed visit with audio only.  Some vital signs may be absent or patient reported.   Gwenn Teodoro E Merl Bommarito, LPN   Review of Systems     Cardiac Risk Factors include: advanced age (>69men, >82 women);dyslipidemia;sedentary lifestyle;obesity (BMI >30kg/m2);hypertension     Objective:    Today's Vitals   04/16/21 1515  Weight: 185 lb (83.9 kg)  Height: 5\' 5"  (1.651 m)  PainSc: 6    Body mass index is 30.79 kg/m.  Advanced Directives 04/16/2021 05/11/2019  Does Patient Have a Medical Advance Directive? No No  Would patient like information on creating a medical advance directive? No - Patient declined No - Patient declined    Current Medications (verified) Outpatient Encounter Medications as of 04/16/2021  Medication Sig   amLODipine (NORVASC) 5 MG tablet Take 1 tablet (5 mg total) by mouth daily.   aspirin EC 81 MG tablet Take 1 tablet (81 mg total) by mouth daily.   atorvastatin (LIPITOR) 20 MG tablet Take 1 tablet (20 mg total) by mouth daily.   Calcium Carbonate-Vitamin D (SM CALCIUM 500/VITAMIN D3 PO)  Take by mouth.   Cholecalciferol (VITAMIN D-1000 MAX ST PO) Take 0.5 tablets by mouth daily.   citalopram (CELEXA) 40 MG tablet Take 1 tablet (40 mg total) by mouth daily.   gabapentin (NEURONTIN) 100 MG capsule TAKE 1 CAPSULE BY MOUTH THREE TIMES A DAY   HYDROcodone-acetaminophen (NORCO) 10-325 MG tablet Take 1 tablet by mouth every 6 (six) hours as needed.   HYDROcodone-acetaminophen (NORCO) 10-325 MG tablet Take 1 tablet by mouth every 8 (eight) hours as needed.   HYDROcodone-acetaminophen (NORCO) 10-325 MG tablet Take 1 tablet by mouth every 8 (eight) hours as needed.   levothyroxine (SYNTHROID) 25 MCG tablet Take 1 tablet (25 mcg total) by mouth daily.   meloxicam (MOBIC) 7.5 MG tablet Take 1 tablet (7.5 mg total) by mouth daily.   multivitamin-lutein (OCUVITE-LUTEIN) CAPS Take 1 capsule by mouth daily.   solifenacin (VESICARE) 5 MG tablet Take 1 tablet (5 mg total) by mouth daily.   No facility-administered encounter medications on file as of 04/16/2021.    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   Family History  Problem Relation Age of Onset   Cancer Father        Lung   Arthritis Father    Dementia Mother    Arthritis Mother    Hypertension Brother    Other Paternal Grandmother        eye tumor   Cancer Paternal Grandfather        prostate   Arthritis Brother    Heart attack Brother    Social History   Socioeconomic History   Marital status: Married    Spouse name: Jimmye Norman   Number of children: 3    Years of education: Not on file   Highest education level: Not on file  Occupational History   Occupation: retired    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 One child passed away. One son lives next door.   Social Determinants of Health   Financial Resource Strain: Low Risk    Difficulty of Paying Living Expenses: Not very hard  Food Insecurity: No Food Insecurity   Worried About Charity fundraiser in the Last Year: Never true   Ran Out of Food in the Last Year: Never true  Transportation Needs: No Transportation Needs   Lack of Transportation (Medical): No   Lack of Transportation (Non-Medical): No  Physical Activity: Insufficiently Active   Days of Exercise per Week: 4 days   Minutes of Exercise per Session: 20 min  Stress: No Stress Concern Present   Feeling of Stress : Only a little  Social Connections: Moderately Integrated   Frequency of Communication with Friends and Family: More than three times a week   Frequency of Social Gatherings with Friends and Family: More than three times a week   Attends Religious Services: More than 4 times per year   Active Member of [employer] or Organizations: No   Attends Music therapist: Never   Marital Status: Married    Tobacco Counseling Counseling given: Not Answered   Clinical Intake:  Pre-visit preparation completed: Yes  Pain : 0-10 Pain Score: 6  Pain Type: Chronic pain Pain Location: Generalized Pain Descriptors / Indicators: Aching, Discomfort, Sore Pain Onset: More than a month ago Pain Frequency: Intermittent     BMI - recorded:  30.79 Nutritional Status: BMI > 30  Obese Nutritional Risks: None Diabetes: No  How often do you need to have someone help you when you read instructions, pamphlets, or other written materials from your doctor or pharmacy?: 1 - Never  Diabetic? No  Interpreter Needed?: No  Information entered by :: Allison Icenhour, LPN   Activities of Daily Living In your present state of health, do you have any difficulty performing the following activities: 04/16/2021  Hearing? N  Vision? Y  Difficulty concentrating or making decisions? N  Walking or climbing stairs? N  Dressing or bathing? N  Doing errands, shopping? N  Preparing Food and eating ? N  Using the  Toilet? N  In the past six months, have you accidently leaked urine? N  Do you have problems with loss of bowel control? N  Managing your Medications? N  Managing your Finances? N  Housekeeping or managing your Housekeeping? N  Some recent data might be hidden    Patient Care Team: Sharion Balloon, FNP as PCP - General (Nurse Practitioner) Buford Dresser, MD as PCP - Cardiology (Cardiology) Jalene Mullet, MD as Consulting Physician (Ophthalmology) Frederik Pear, MD as Consulting Physician (Orthopedic Surgery)  Indicate any recent Medical Services you may have received from other than [facility] providers in the past year (date may be approximate).     Assessment:   This is a routine wellness examination for Allison Parker.  Hearing/Vision screen Hearing Screening - Comments:: Denies hearing difficulties  Vision Screening - Comments:: Wears eyeglasses - has macular degeneration - up to date with routine eye exam with Dr Posey Pronto @ [facility]  Dietary issues and exercise activities discussed: Current Exercise Habits: Home exercise routine, Type of exercise: walking;Other - see comments (house work, walking dogs), Time (Minutes): 20, Frequency (Times/Week): 5, Weekly Exercise (Minutes/Week): 100, Intensity: Mild, Exercise limited by:  orthopedic condition(s)   Goals Addressed             This Visit's Progress    Patient Stated       She wants to lose some more weight and get her kitchen table cleaned off        Depression Screen PHQ 2/9 Scores 04/16/2021 10/20/2020 07/21/2020 04/14/2020 01/15/2020 07/16/2019 05/11/2019  PHQ - 2 Score 2 0 0 0 1 0 0  PHQ- 9 Score 5 3 0 4 4 - -    Fall Risk Fall Risk  04/16/2021 10/20/2020 07/21/2020 04/14/2020 01/15/2020  Falls in the past year? 1 0 1 0 0  Number falls in past yr: 1 - 1 - -  Injury with Fall? 0 - 1 - -  Comment - - - - -  Risk Factor Category  - - - - -  Risk for fall due to : Impaired balance/gait;History of fall(s);Impaired vision;Medication side effect;Orthopedic patient - Impaired mobility - -  Risk for fall due to: Comment - - - - -  Follow up Falls prevention discussed;Education provided - Education provided - -    FALL RISK PREVENTION PERTAINING TO THE HOME:  Any stairs in or around the home? Yes  If so, are there any without handrails? No  Home free of loose throw rugs in walkways, pet beds, electrical cords, etc? Yes  Adequate lighting in your home to reduce risk of falls? Yes   ASSISTIVE DEVICES UTILIZED TO PREVENT FALLS:  Life alert? No  Use of a cane, walker or w/c? Yes  Grab bars in the bathroom? Yes  Shower chair or bench in shower? Yes  Elevated toilet seat or a handicapped toilet? Yes   TIMED UP AND GO:  Was the test performed? No . Telephonic visit.  Cognitive Function: Normal cognitive status assessed by direct observation by this Nurse Health Advisor. No abnormalities found.      6CIT Screen 05/11/2019  What Year? 0 points  What month? 0 points  What time? 0 points  Count back from 20 0 points  Months in reverse 0 points  Repeat phrase 0 points  Total Score 0    Immunizations Immunization History  Administered Date(s) Administered   Fluad Quad(high Dose 65+) 08/16/2019, 10/20/2020   Influenza, High Dose Seasonal PF  10/11/2017, 10/10/2018  Influenza,inj,Quad PF,6+ Mos 08/14/2013, 11/11/2014, 08/08/2015, 07/13/2016   Janssen (J&J) SARS-COV-2 Vaccination 02/21/2020   PFIZER(Purple Top)SARS-COV-2 Vaccination 11/26/2020   Pneumococcal Conjugate-13 07/13/2016   Pneumococcal Polysaccharide-23 08/07/2012   Tdap 09/25/2007, 06/11/2014    TDAP status: Up to date  Flu Vaccine status: Up to date  Pneumococcal vaccine status: Up to date  Covid-19 vaccine status: Information provided on how to obtain vaccines.   Qualifies for Shingles Vaccine? Yes   Zostavax completed No   Shingrix Completed?: No.    Education has been provided regarding the importance of this vaccine. Patient has been advised to call insurance company to determine out of pocket expense if they have not yet received this vaccine. Advised may also receive vaccine at local pharmacy or Health Dept. Verbalized acceptance and understanding.  Screening Tests Health Maintenance  Topic Date Due   Zoster Vaccines- Shingrix (1 of 2) Never done   DEXA SCAN  01/12/2017   COVID-19 Vaccine (3 - Booster for Janssen series) 03/26/2021   INFLUENZA VACCINE  05/25/2021   TETANUS/TDAP  06/11/2024   Hepatitis C Screening  Completed   PNA vac Low Risk Adult  Completed   HPV VACCINES  Aged Out    Health Maintenance  Health Maintenance Due  Topic Date Due   Zoster Vaccines- Shingrix (1 of 2) Never done   DEXA SCAN  01/12/2017   COVID-19 Vaccine (3 - Booster for Janssen series) 03/26/2021    Colorectal cancer screening: No longer required.   Mammogram status: Completed 12/22/20. Repeat every year  Bone Density status: Completed 01/13/2015. Results reflect: Bone density results: NORMAL. Repeat every 5 years. Per patient - she likely won't have time to repeat at next visit.  Lung Cancer Screening: (Low Dose CT Chest recommended if Age 61-80 years, 30 pack-year currently smoking OR have quit w/in 15years.) does not qualify.   Additional  Screening:  Hepatitis C Screening: does not qualify; Completed 06/11/2014  Vision Screening: Recommended annual ophthalmology exams for early detection of glaucoma and other disorders of the eye. Is the patient up to date with their annual eye exam?  Yes  Who is the provider or what is the name of the office in which the patient attends annual eye exams? [facility] If pt is not established with a provider, would they like to be referred to a provider to establish care? No .   Dental Screening: Recommended annual dental exams for proper oral hygiene  Community Resource Referral / Chronic Care Management: CRR required this visit?  No   CCM required this visit?  No      Plan:     I have personally reviewed and noted the following in the patient's chart:   Medical and social history Use of alcohol, tobacco or illicit drugs  Current medications and supplements including opioid prescriptions.  Functional ability and status Nutritional status Physical activity Advanced directives List of other physicians Hospitalizations, surgeries, and ER visits in previous 12 months Vitals Screenings to include cognitive, depression, and falls Referrals and appointments  In addition, I have reviewed and discussed with patient certain preventive protocols, quality metrics, and best practice recommendations. A written personalized care plan for preventive services as well as general preventive health recommendations were provided to patient.     Allison Hammond, LPN   [id number]/61/0960   Nurse Notes: None

## 2021-04-16 NOTE — Patient Instructions (Signed)
Ms. Surman , Thank you for taking time to come for your Medicare Wellness Visit. I appreciate your ongoing commitment to your health goals. Please review the following plan we discussed and let me know if I can assist you in the future.   Screening recommendations/referrals: Colonoscopy: Done 07/03/2013 - No longer required Mammogram: Done 12/22/2020 - Repeat annually Bone Density: Done 01/13/2015 - Repeat every 2-5 years *due Recommended yearly ophthalmology/optometry visit for glaucoma screening and checkup Recommended yearly dental visit for hygiene and checkup  Vaccinations: Influenza vaccine: Done 08/20/2020 - Repeat annually Pneumococcal vaccine: Done 08/07/2012 & 07/13/2016 Tdap vaccine: Done 06/01/2014 - Repeat in 10 years Shingles vaccine: Due (2 doses 2-6 months apart) Shingrix discussed. Please contact your pharmacy for coverage information.    Covid-19: Nile Riggs 02/21/2020 & [place] 11/26/2020  Advanced directives: Advance directive discussed with you today. Even though you declined this today, please call our office should you change your mind, and we can give you the proper paperwork for you to fill out.  Conditions/risks identified: Aim for 30 minutes of exercise or brisk walking each day, drink 6-8 glasses of water and eat lots of fruits and vegetables.  Next appointment: Follow up in one year for your annual wellness visit    Preventive Care 65 Years and Older, Female Preventive care refers to lifestyle choices and visits with your health care provider that can promote health and wellness. What does preventive care include? A yearly physical exam. This is also called an annual well check. Dental exams once or twice a year. Routine eye exams. Ask your health care provider how often you should have your eyes checked. Personal lifestyle choices, including: Daily care of your teeth and gums. Regular physical activity. Eating a healthy diet. Avoiding tobacco and drug  use. Limiting alcohol use. Practicing safe sex. Taking low-dose aspirin every day. Taking vitamin and mineral supplements as recommended by your health care provider. What happens during an annual well check? The services and screenings done by your health care provider during your annual well check will depend on your age, overall health, lifestyle risk factors, and family history of disease. Counseling  Your health care provider may ask you questions about your: Alcohol use. Tobacco use. Drug use. Emotional well-being. Home and relationship well-being. Sexual activity. Eating habits. History of falls. Memory and ability to understand (cognition). Work and work Statistician. Reproductive health. Screening  You may have the following tests or measurements: Height, weight, and BMI. Blood pressure. Lipid and cholesterol levels. These may be checked every 5 years, or more frequently if you are over 27 years old. Skin check. Lung cancer screening. You may have this screening every year starting at age 22 if you have a 30-pack-year history of smoking and currently smoke or have quit within the past 15 years. Fecal occult blood test (FOBT) of the stool. You may have this test every year starting at age 22. Flexible sigmoidoscopy or colonoscopy. You may have a sigmoidoscopy every 5 years or a colonoscopy every 10 years starting at age 50. Hepatitis C blood test. Hepatitis B blood test. Sexually transmitted disease (STD) testing. Diabetes screening. This is done by checking your blood sugar (glucose) after you have not eaten for a while (fasting). You may have this done every 1-3 years. Bone density scan. This is done to screen for osteoporosis. You may have this done starting at age 60. Mammogram. This may be done every 1-2 years. Talk to your health care provider about how often  you should have regular mammograms. Talk with your health care provider about your test results, treatment  options, and if necessary, the need for more tests. Vaccines  Your health care provider may recommend certain vaccines, such as: Influenza vaccine. This is recommended every year. Tetanus, diphtheria, and acellular pertussis (Tdap, Td) vaccine. You may need a Td booster every 10 years. Zoster vaccine. You may need this after age 62. Pneumococcal 13-valent conjugate (PCV13) vaccine. One dose is recommended after age 19. Pneumococcal polysaccharide (PPSV23) vaccine. One dose is recommended after age 47. Talk to your health care provider about which screenings and vaccines you need and how often you need them. This information is not intended to replace advice given to you by your health care provider. Make sure you discuss any questions you have with your health care provider. Document Released: 11/07/2015 Document Revised: 06/30/2016 Document Reviewed: 08/12/2015 Elsevier Interactive Patient Education  2017 [place] Prevention in the Home Falls can cause injuries. They can happen to people of all ages. There are many things you can do to make your home safe and to help prevent falls. What can I do on the outside of my home? Regularly fix the edges of walkways and driveways and fix any cracks. Remove anything that might make you trip as you walk through a door, such as a raised step or threshold. Trim any bushes or trees on the path to your home. Use bright outdoor lighting. Clear any walking paths of anything that might make someone trip, such as rocks or tools. Regularly check to see if handrails are loose or broken. Make sure that both sides of any steps have handrails. Any raised decks and porches should have guardrails on the edges. Have any leaves, snow, or ice cleared regularly. Use sand or salt on walking paths during winter. Clean up any spills in your garage right away. This includes oil or grease spills. What can I do in the bathroom? Use night lights. Install grab  bars by the toilet and in the tub and shower. Do not use towel bars as grab bars. Use non-skid mats or decals in the tub or shower. If you need to sit down in the shower, use a plastic, non-slip stool. Keep the floor dry. Clean up any water that spills on the floor as soon as it happens. Remove soap buildup in the tub or shower regularly. Attach bath mats securely with double-sided non-slip rug tape. Do not have throw rugs and other things on the floor that can make you trip. What can I do in the bedroom? Use night lights. Make sure that you have a light by your bed that is easy to reach. Do not use any sheets or blankets that are too big for your bed. They should not hang down onto the floor. Have a firm chair that has side arms. You can use this for support while you get dressed. Do not have throw rugs and other things on the floor that can make you trip. What can I do in the kitchen? Clean up any spills right away. Avoid walking on wet floors. Keep items that you use a lot in easy-to-reach places. If you need to reach something above you, use a strong step stool that has a grab bar. Keep electrical cords out of the way. Do not use floor polish or wax that makes floors slippery. If you must use wax, use non-skid floor wax. Do not have throw rugs and other things on  the floor that can make you trip. What can I do with my stairs? Do not leave any items on the stairs. Make sure that there are handrails on both sides of the stairs and use them. Fix handrails that are broken or loose. Make sure that handrails are as long as the stairways. Check any carpeting to make sure that it is firmly attached to the stairs. Fix any carpet that is loose or worn. Avoid having throw rugs at the top or bottom of the stairs. If you do have throw rugs, attach them to the floor with carpet tape. Make sure that you have a light switch at the top of the stairs and the bottom of the stairs. If you do not have them,  ask someone to add them for you. What else can I do to help prevent falls? Wear shoes that: Do not have high heels. Have rubber bottoms. Are comfortable and fit you well. Are closed at the toe. Do not wear sandals. If you use a stepladder: Make sure that it is fully opened. Do not climb a closed stepladder. Make sure that both sides of the stepladder are locked into place. Ask someone to hold it for you, if possible. Clearly mark and make sure that you can see: Any grab bars or handrails. First and last steps. Where the edge of each step is. Use tools that help you move around (mobility aids) if they are needed. These include: Canes. Walkers. Scooters. Crutches. Turn on the lights when you go into a dark area. Replace any light bulbs as soon as they burn out. Set up your furniture so you have a clear path. Avoid moving your furniture around. If any of your floors are uneven, fix them. If there are any pets around you, be aware of where they are. Review your medicines with your doctor. Some medicines can make you feel dizzy. This can increase your chance of falling. Ask your doctor what other things that you can do to help prevent falls. This information is not intended to replace advice given to you by your health care provider. Make sure you discuss any questions you have with your health care provider. Document Released: 08/07/2009 Document Revised: 03/18/2016 Document Reviewed: 11/15/2014 Elsevier Interactive Patient Education  2017 [employer]  Managing Pain Without Opioids Opioids are strong medicines used to treat moderate to severe pain. For some people, especially those who have long-term (chronic) pain, opioids may not be the best choice for pain management due to: Side effects like nausea, constipation, and sleepiness. The risk of addiction (opioid use disorder). The longer you take opioids, the greater your risk of addiction. Pain that lasts for more than 3 months is  called chronic pain. Managing chronic pain usually requires more than one approach and is often provided by a team of health care providers working together (multidisciplinary approach). Pain management may be done at a pain management center or pain clinic. Types of pain management without opioids Managing pain without opioids can involve: Non-opioid medicines. Exercises to help relieve pain and improve strength and range of motion (physical therapy). Therapy to help with everyday tasks and activities (occupational therapy). Therapy to help you find ways to relieve pain by doing things you enjoy (recreational therapy). Talk therapy (psychotherapy) and other mental health therapies. Medical treatments such as injections or devices. Making lifestyle changes. Pain management options Non-opioid medicines Non-opioid medicines for pain may include medicines taken by mouth (oral medicines), such as: Over-the-counter or prescription NSAIDs.  These may be the first medicines used for pain. They work well for muscle and bone pain, and they reduce swelling. Acetaminophen. This over-the-counter medicine may work well for milder pain but not swelling. Antidepressants. These may be used to treat chronic pain. A certain type of antidepressant (tricyclics) is often used. These medicines are given in lower doses for pain than when used for depression. Anticonvulsants. These are usually used to treat seizures but may also reduce nerve (neuropathic) pain. Muscle relaxants. These relieve pain caused by sudden muscle tightening (spasms). You may also use a type of pain medicine that is applied to the skin as a patch, cream, or gel (topical analgesic), such as a numbing medicine. These may cause fewer side effects than oralmedicines. Therapy Physical therapy involves doing exercises to gain strength and flexibility. A physical therapist may teach you exercises to move and stretch parts of your body that are weak,  stiff, or painful. You can learn these exercises at physical therapy visits and practice them at home. Physical therapy may also involve: Massage. Heat wraps or applying heat or cold to affected areas. Sending electrical signals through the skin to interrupt pain signals (transcutaneous electrical nerve stimulation, TENS). Sending weak lasers through the skin to reduce pain and swelling (low-level laser therapy). Using signals from your body to help you learn to regulate pain (biofeedback). Occupational therapy helps you learn ways to function at home and work withless pain. Recreational therapy may involve trying new activities or hobbies, such asdrawing or a physical activity. Types of mental health therapy for pain include: Cognitive behavioral therapy (CBT) to help you learn coping skills for dealing with pain. Acceptance and commitment therapy (ACT) to change the way you think and react to pain. Relaxation therapies, including muscle relaxation exercises and focusing your mind on the present moment to lower stress (mindfulness-based stress reduction). Pain management counseling. This may be individual, family, or group counseling.  Medical treatments Medical treatments for pain management include: Nerve block injections. These may include a pain blocker and anti-inflammatory medicines. You may have injections: Near the spine to relieve chronic back or neck pain. Into joints to relieve back or joint pain. Into nerve areas that supply a painful area to relieve body pain. Into muscles (trigger point injections) to relieve some painful muscle conditions. A medical device placed near your spine to help block pain signals and relieve nerve pain or chronic back pain (spinal cord stimulation device). Acupuncture. Follow these instructions at home Medicines Take over-the-counter and prescription medicines only as told by your health care provider. If you are taking pain medicine, ask your  health care providers about possible side effects to watch out for. Do not drive or use heavy machinery while taking prescription pain medicine. Lifestyle  Do not use drugs or alcohol to reduce pain. Limit alcohol intake to no more than 1 drink a day for nonpregnant women and 2 drinks a day for men. One drink equals 12 oz of beer, 5 oz of wine, or 1 oz of hard liquor. Do not use any products that contain nicotine or tobacco, such as cigarettes and e-cigarettes. These can delay healing. If you need help quitting, ask your health care provider. Eat a healthy diet and maintain a healthy weight. Poor diet and excess weight may make pain worse. Eat foods that are high in fiber. These include fresh fruits and vegetables, whole grains, and beans. Limit foods that are high in fat and processed sugars, such as fried and sweet  foods. Exercise regularly. Exercise lowers stress and may help relieve pain. Ask your health care provider what activities and exercises are safe for you. If your health care provider approves, join an exercise class that combines movement and stress reduction. Examples include yoga and tai chi. Get enough sleep. Lack of sleep may make pain worse. Lower stress as much as possible. Practice stress reduction techniques as told by your therapist.  General instructions Work with all your pain management providers to find the treatments that work best for you. You are an important member of your pain management team. There are many things you can do to reduce pain on your own. Consider joining an online or in-person support group for people who have chronic pain. Keep all follow-up visits as told by your health care providers. This is important. Where to find more information You can find more information about managing pain without opioids from: American Academy of Pain Medicine: painmed.[place] for Chronic Pain: instituteforchronicpain.org American Chronic Pain Association:  theacpa.org Contact a health care provider if: You have side effects from pain medicine. Your pain gets worse or does not get better with treatments or home care. You are struggling with anxiety or depression. Summary Many types of pain can be managed without opioids. Chronic pain may respond better to pain management without opioids. Pain is best managed with a team of providers working together. Pain management without opioids may include non-opioid medicines, medical treatments, physical therapy, mental health therapy, and lifestyle changes. Tell your health care providers if your pain gets worse or is not being managed well enough. This information is not intended to replace advice given to you by your health care provider. Make sure you discuss any questions you have with your healthcare provider. Document Revised: 07/22/2020 Document Reviewed: 07/24/2020 Elsevier Patient Education  [employer].

## 2021-04-17 ENCOUNTER — Other Ambulatory Visit: Payer: Self-pay

## 2021-04-17 ENCOUNTER — Encounter: Payer: Self-pay | Admitting: Family

## 2021-04-17 ENCOUNTER — Ambulatory Visit (INDEPENDENT_AMBULATORY_CARE_PROVIDER_SITE_OTHER): Payer: Medicare Other | Admitting: Family

## 2021-04-17 VITALS — BP 129/65 | HR 79 | Temp 98.1°F | Resp 20 | Ht 65.0 in | Wt 201.0 lb

## 2021-04-17 DIAGNOSIS — F1120 Opioid dependence, uncomplicated: Secondary | ICD-10-CM

## 2021-04-17 DIAGNOSIS — I10 Essential (primary) hypertension: Secondary | ICD-10-CM | POA: Diagnosis not present

## 2021-04-17 DIAGNOSIS — G4700 Insomnia, unspecified: Secondary | ICD-10-CM

## 2021-04-17 DIAGNOSIS — G890 Central pain syndrome: Secondary | ICD-10-CM

## 2021-04-17 DIAGNOSIS — E559 Vitamin D deficiency, unspecified: Secondary | ICD-10-CM

## 2021-04-17 DIAGNOSIS — G8929 Other chronic pain: Secondary | ICD-10-CM

## 2021-04-17 DIAGNOSIS — M5442 Lumbago with sciatica, left side: Secondary | ICD-10-CM

## 2021-04-17 DIAGNOSIS — E669 Obesity, unspecified: Secondary | ICD-10-CM

## 2021-04-17 DIAGNOSIS — I6930 Unspecified sequelae of cerebral infarction: Secondary | ICD-10-CM

## 2021-04-17 DIAGNOSIS — N3281 Overactive bladder: Secondary | ICD-10-CM

## 2021-04-17 DIAGNOSIS — Z0289 Encounter for other administrative examinations: Secondary | ICD-10-CM

## 2021-04-17 DIAGNOSIS — F411 Generalized anxiety disorder: Secondary | ICD-10-CM

## 2021-04-17 DIAGNOSIS — F331 Major depressive disorder, recurrent, moderate: Secondary | ICD-10-CM

## 2021-04-17 DIAGNOSIS — R52 Pain, unspecified: Secondary | ICD-10-CM

## 2021-04-17 DIAGNOSIS — E785 Hyperlipidemia, unspecified: Secondary | ICD-10-CM

## 2021-04-17 DIAGNOSIS — E039 Hypothyroidism, unspecified: Secondary | ICD-10-CM

## 2021-04-17 MED ORDER — HYDROCODONE-ACETAMINOPHEN 10-325 MG PO TABS: 1.0000 | ORAL_TABLET | Freq: Four times a day (QID) | ORAL | 0 refills | Status: DC | PRN

## 2021-04-17 MED ORDER — HYDROCODONE-ACETAMINOPHEN 10-325 MG PO TABS: 1.0000 | ORAL_TABLET | Freq: Three times a day (TID) | ORAL | 0 refills | Status: DC | PRN

## 2021-04-17 NOTE — Progress Notes (Signed)
Subjective:    Patient ID: Allison Parker, female    DOB: 02/18/1941, 80 y.o.   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 PT states this is stable at this time.  Pt is followed by Ortho annually for arthritis.  Hypertension This is a chronic problem. The current episode started more than 1 year ago. The problem has been resolved since onset. The problem is controlled. Associated symptoms include anxiety, malaise/fatigue and peripheral edema. Pertinent negatives include no shortness of breath. Risk factors for coronary artery disease include dyslipidemia, obesity and sedentary lifestyle. The current treatment provides moderate improvement. There is no history of heart failure. Identifiable causes of hypertension include a thyroid problem.  Thyroid Problem Presents for follow-up visit. Symptoms include anxiety, dry skin and fatigue. Patient reports no depressed mood or diaphoresis. The symptoms have been stable. There is no history of heart failure.  Urinary Frequency  This is a chronic problem. The current episode started more than 1 year ago. The problem has been unchanged. The patient is experiencing no pain. Associated symptoms include frequency.  Insomnia Primary symptoms: difficulty falling asleep, frequent awakening, malaise/fatigue.   The current episode started more than one year. The onset quality is gradual. PMH includes: depression.   Anxiety Presents for follow-up visit. Symptoms include excessive worry, insomnia, irritability and nervous/anxious behavior. Patient reports no depressed mood or shortness of breath. Symptoms occur most days. The severity of symptoms is moderate.    Depression        This is a chronic  problem.  The onset quality is gradual.   The problem occurs intermittently.  Associated symptoms include fatigue, insomnia and irritable.  Associated symptoms include no helplessness and no hopelessness.  Past treatments include SSRIs - Selective serotonin reuptake inhibitors.  Past medical history includes thyroid problem and anxiety.   Back Pain This is a chronic problem. The current episode started more than 1 year ago. The problem has been waxing and waning since onset. The pain is present in the lumbar spine.     Review of Systems  Constitutional:  Positive for fatigue, irritability and malaise/fatigue. Negative for diaphoresis.  Respiratory:  Negative for shortness of breath.   Genitourinary:  Positive for frequency.  Musculoskeletal:  Positive for back pain.  Psychiatric/Behavioral:  Positive for depression. The patient is nervous/anxious and has insomnia.   All other systems reviewed and are negative.     Objective:   Physical Exam Vitals reviewed.  Constitutional:      General: She is irritable. She is not in acute distress.    Appearance: She is well-developed.  HENT:     Head: Normocephalic and atraumatic.     Right Ear: Tympanic membrane normal.     Left Ear: Tympanic membrane normal.  Eyes:     Pupils: Pupils are equal, round, and reactive to light.  Neck:     Thyroid: No thyromegaly.  Cardiovascular:     Rate and Rhythm: Normal rate and regular rhythm.     Heart sounds: Normal heart sounds. No murmur heard. Pulmonary:     Effort: Pulmonary effort is normal. No respiratory distress.     Breath sounds: Normal breath sounds. No wheezing.  Abdominal:  General: Bowel sounds are normal. There is no distension.     Palpations: Abdomen is soft.     Tenderness: There is no abdominal tenderness.  Musculoskeletal:        General: No tenderness. Normal range of motion.     Cervical back: Normal range of motion and neck supple.  Skin:    General: Skin is warm and dry.   Neurological:     Mental Status: She is alert and oriented to person, place, and time.     Cranial Nerves: No cranial nerve deficit.     Motor: Weakness (right sided weakness, using cane to walk) present.     Gait: Gait abnormal.     Deep Tendon Reflexes: Reflexes are normal and symmetric.  Psychiatric:        Behavior: Behavior normal.        Thought Content: Thought content normal.        Judgment: Judgment normal.      BP 129/65   Pulse 79   Temp 98.1 F (36.7 C) (Temporal)   Resp 20   Ht _0  (1.651 m)   Wt 201 lb (91.2 kg)   SpO2 95%   BMI 33.45 kg/m      Assessment & Plan:  Allison Parker comes in today with chief complaint of Medical Management of Chronic Issues   Diagnosis and orders addressed:  1. Pain management - HYDROcodone-acetaminophen (NORCO) 10-325 MG tablet; Take 1 tablet by mouth every 6 (six) hours as needed.  Dispense: 90 tablet; Refill: 0 - HYDROcodone-acetaminophen (NORCO) 10-325 MG tablet; Take 1 tablet by mouth every 8 (eight) hours as needed.  Dispense: 90 tablet; Refill: 0 - HYDROcodone-acetaminophen (NORCO) 10-325 MG tablet; Take 1 tablet by mouth every 8 (eight) hours as needed.  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  Dispense: 90 tablet; Refill: 0 - HYDROcodone-acetaminophen (NORCO) 10-325 MG tablet; Take 1 tablet by mouth every 8 (eight) hours as needed.  Dispense: 90 tablet; Refill: 0 - HYDROcodone-acetaminophen (NORCO) 10-325 MG tablet; Take 1 tablet by mouth every 8 (eight) hours as needed.  Dispense: 90 tablet; Refill: 0 - CMP14+EGFR - CBC with Differential/Platelet  3. Thalamic pain syndrome - HYDROcodone-acetaminophen (NORCO) 10-325 MG tablet; Take 1 tablet by mouth every 6 (six) hours as needed.  Dispense: 90 tablet; Refill:  0 - HYDROcodone-acetaminophen (NORCO) 10-325 MG tablet; Take 1 tablet by mouth every 8 (eight) hours as needed.  Dispense: 90 tablet; Refill: 0 - HYDROcodone-acetaminophen (NORCO) 10-325 MG tablet; Take 1 tablet by mouth every 8 (eight) hours as needed.  Dispense: 90 tablet; Refill: 0 - CMP14+EGFR - CBC with Differential/Platelet  4. Pain medication agreement signed - HYDROcodone-acetaminophen (NORCO) 10-325 MG tablet; Take 1 tablet by mouth every 6 (six) hours as needed.  Dispense: 90 tablet; Refill: 0 - HYDROcodone-acetaminophen (NORCO) 10-325 MG tablet; Take 1 tablet by mouth every 8 (eight) hours as needed.  Dispense: 90 tablet; Refill: 0 - HYDROcodone-acetaminophen (NORCO) 10-325 MG tablet; Take 1 tablet by mouth every 8 (eight) hours as needed.  Dispense: 90 tablet; Refill: 0 - CMP14+EGFR - CBC with Differential/Platelet  5. Uncomplicated opioid dependence (HCC) - HYDROcodone-acetaminophen (NORCO) 10-325 MG tablet; Take 1 tablet by mouth every 6 (six) hours as needed.  Dispense: 90 tablet; Refill: 0 - HYDROcodone-acetaminophen (NORCO) 10-325 MG tablet;  Take 1 tablet by mouth every 8 (eight) hours as needed.  Dispense: 90 tablet; Refill: 0 - HYDROcodone-acetaminophen (NORCO) 10-325 MG tablet; Take 1 tablet by mouth every 8 (eight) hours as needed.  Dispense: 90 tablet; Refill: 0 - CMP14+EGFR - CBC with Differential/Platelet  6. Essential hypertension, benign - CMP14+EGFR - CBC with Differential/Platelet  7. Hypothyroidism, unspecified type - CMP14+EGFR - CBC with Differential/Platelet - TSH  8. Overactive bladder - CMP14+EGFR - CBC with Differential/Platelet  9. Insomnia, unspecified type - CMP14+EGFR - CBC with Differential/Platelet  10. Hyperlipidemia, unspecified hyperlipidemia type - CMP14+EGFR - CBC with Differential/Platelet  11. Moderate episode of recurrent major depressive disorder (HCC) - CMP14+EGFR - CBC with Differential/Platelet  12. GAD (generalized  anxiety disorder) - CMP14+EGFR - CBC with Differential/Platelet  13. Obesity (BMI 30-39.9) - CMP14+EGFR - CBC with Differential/Platelet  14. Vitamin D deficiency - CMP14+EGFR - CBC with Differential/Platelet  15. Late effect of cerebrovascular accident  - CMP14+EGFR - CBC with Differential/Platelet - Lipid panel   Labs pending Health Maintenance reviewed Diet and exercise encouraged  Follow up plan: 3 months    Evelina Dun, FNP

## 2021-04-17 NOTE — Patient Instructions (Signed)

## 2021-04-18 LAB — LIPID PANEL
Chol/HDL Ratio: 2.3 ratio (ref 0.0–4.4)
Cholesterol, Total: 164 mg/dL (ref 100–199)
HDL: 70 mg/dL (ref >39)
LDL Chol Calc (NIH): 70 mg/dL (ref 0–99)
Triglycerides: 143 mg/dL (ref 0–149)
VLDL Cholesterol Cal: 24 mg/dL (ref 5–40)

## 2021-04-18 LAB — CMP14+EGFR
ALT: 61 IU/L — ABNORMAL HIGH (ref 0–32)
AST: 36 IU/L (ref 0–40)
Albumin/Globulin Ratio: 1.6 (ref 1.2–2.2)
Albumin: 4.4 g/dL (ref 3.7–4.7)
Alkaline Phosphatase: 189 IU/L — ABNORMAL HIGH (ref 44–121)
BUN/Creatinine Ratio: 23 (ref 12–28)
BUN: 21 mg/dL (ref 8–27)
Bilirubin Total: 0.3 mg/dL (ref 0.0–1.2)
CO2: 25 mmol/L (ref 20–29)
Calcium: 9.5 mg/dL (ref 8.7–10.3)
Chloride: 99 mmol/L (ref 96–106)
Creatinine, Ser: 0.92 mg/dL (ref 0.57–1.00)
Globulin, Total: 2.8 g/dL (ref 1.5–4.5)
Glucose: 90 mg/dL (ref 65–99)
Potassium: 5 mmol/L (ref 3.5–5.2)
Sodium: 142 mmol/L (ref 134–144)
Total Protein: 7.2 g/dL (ref 6.0–8.5)
eGFR: 64 mL/min/{1.73_m2} (ref >59)

## 2021-04-18 LAB — CBC WITH DIFFERENTIAL/PLATELET
Basophils Absolute: 0 10*3/uL (ref 0.0–0.2)
Basos: 1 %
EOS (ABSOLUTE): 0.1 10*3/uL (ref 0.0–0.4)
Eos: 2 %
Hematocrit: 40.6 % (ref 34.0–46.6)
Hemoglobin: 13.7 g/dL (ref 11.1–15.9)
Immature Grans (Abs): 0 10*3/uL (ref 0.0–0.1)
Immature Granulocytes: 0 %
Lymphocytes Absolute: 1.9 10*3/uL (ref 0.7–3.1)
Lymphs: 26 %
MCH: 31.4 pg (ref 26.6–33.0)
MCHC: 33.7 g/dL (ref 31.5–35.7)
MCV: 93 fL (ref 79–97)
Monocytes Absolute: 0.8 10*3/uL (ref 0.1–0.9)
Monocytes: 11 %
Neutrophils Absolute: 4.5 10*3/uL (ref 1.4–7.0)
Neutrophils: 60 %
Platelets: 230 10*3/uL (ref 150–450)
RBC: 4.37 x10E6/uL (ref 3.77–5.28)
RDW: 12.1 % (ref 11.7–15.4)
WBC: 7.4 10*3/uL (ref 3.4–10.8)

## 2021-04-18 LAB — TSH: TSH: 2.3 u[IU]/mL (ref 0.450–4.500)

## 2021-04-21 ENCOUNTER — Other Ambulatory Visit: Payer: Self-pay | Admitting: Family

## 2021-04-21 MED ORDER — OXYCODONE HCL 5 MG PO TABS: 5.0000 mg | ORAL_TABLET | Freq: Two times a day (BID) | ORAL | 0 refills | Status: DC | PRN

## 2021-06-10 ENCOUNTER — Other Ambulatory Visit: Payer: Self-pay | Admitting: Family

## 2021-06-10 DIAGNOSIS — I10 Essential (primary) hypertension: Secondary | ICD-10-CM

## 2021-07-01 ENCOUNTER — Other Ambulatory Visit: Payer: Self-pay | Admitting: Family

## 2021-07-01 DIAGNOSIS — E785 Hyperlipidemia, unspecified: Secondary | ICD-10-CM

## 2021-07-21 ENCOUNTER — Other Ambulatory Visit: Payer: Self-pay

## 2021-07-21 ENCOUNTER — Ambulatory Visit (INDEPENDENT_AMBULATORY_CARE_PROVIDER_SITE_OTHER): Payer: Medicare Other | Admitting: Family

## 2021-07-21 ENCOUNTER — Encounter: Payer: Self-pay | Admitting: Family

## 2021-07-21 VITALS — BP 159/79 | HR 76 | Temp 98.0°F | Ht 65.0 in | Wt 199.0 lb

## 2021-07-21 DIAGNOSIS — E039 Hypothyroidism, unspecified: Secondary | ICD-10-CM | POA: Diagnosis not present

## 2021-07-21 DIAGNOSIS — N3281 Overactive bladder: Secondary | ICD-10-CM

## 2021-07-21 DIAGNOSIS — G890 Central pain syndrome: Secondary | ICD-10-CM | POA: Diagnosis not present

## 2021-07-21 DIAGNOSIS — I10 Essential (primary) hypertension: Secondary | ICD-10-CM | POA: Diagnosis not present

## 2021-07-21 DIAGNOSIS — R748 Abnormal levels of other serum enzymes: Secondary | ICD-10-CM

## 2021-07-21 DIAGNOSIS — F411 Generalized anxiety disorder: Secondary | ICD-10-CM

## 2021-07-21 DIAGNOSIS — G8929 Other chronic pain: Secondary | ICD-10-CM

## 2021-07-21 DIAGNOSIS — Z0289 Encounter for other administrative examinations: Secondary | ICD-10-CM

## 2021-07-21 DIAGNOSIS — I6930 Unspecified sequelae of cerebral infarction: Secondary | ICD-10-CM

## 2021-07-21 DIAGNOSIS — M5442 Lumbago with sciatica, left side: Secondary | ICD-10-CM

## 2021-07-21 DIAGNOSIS — E785 Hyperlipidemia, unspecified: Secondary | ICD-10-CM

## 2021-07-21 DIAGNOSIS — G4700 Insomnia, unspecified: Secondary | ICD-10-CM

## 2021-07-21 DIAGNOSIS — F331 Major depressive disorder, recurrent, moderate: Secondary | ICD-10-CM

## 2021-07-21 DIAGNOSIS — E669 Obesity, unspecified: Secondary | ICD-10-CM

## 2021-07-21 DIAGNOSIS — E559 Vitamin D deficiency, unspecified: Secondary | ICD-10-CM

## 2021-07-21 DIAGNOSIS — F1120 Opioid dependence, uncomplicated: Secondary | ICD-10-CM

## 2021-07-21 MED ORDER — OXYCODONE HCL 5 MG PO TABS: 5.0000 mg | ORAL_TABLET | Freq: Three times a day (TID) | ORAL | 0 refills | Status: DC | PRN

## 2021-07-21 NOTE — Patient Instructions (Signed)
Fatty Liver Disease  The liver converts food into energy, removes toxic material from the blood, makes important proteins, and absorbs necessary vitamins from food. Fatty liver disease occurs when too much fat has built up in your liver cells. Fatty liverdisease is also called hepatic steatosis. In many cases, fatty liver disease does not cause symptoms or problems. It is often diagnosed when tests are being done for other reasons. However, over time, fatty liver can cause inflammation that may lead to more serious liver problems, such as scarring of the liver (cirrhosis) and liver failure. Fatty liver is associated with insulin resistance, increased body fat, high blood pressure (hypertension), and high cholesterol. These are features of metabolic syndrome and increaseyour risk for stroke, diabetes, and heart disease. What are the causes? This condition may be caused by components of metabolic syndrome: Obesity. Insulin resistance. High cholesterol. Other causes: Alcohol abuse. Poor nutrition. Cushing syndrome. Pregnancy. Certain drugs. Poisons. Some viral infections. What increases the risk? You are more likely to develop this condition if you: Abuse alcohol. Are overweight. Have diabetes. Have hepatitis. Have a high triglyceride level. Are pregnant. What are the signs or symptoms? Fatty liver disease often does not cause symptoms. If symptoms do develop, they can include: Fatigue and weakness. Weight loss. Confusion. Nausea, vomiting, or abdominal pain. Yellowing of your skin and the white parts of your eyes (jaundice). Itchy skin. How is this diagnosed? This condition may be diagnosed by: A physical exam and your medical history. Blood tests. Imaging tests, such as an ultrasound, CT scan, or MRI. A liver biopsy. A small sample of liver tissue is removed using a needle. The sample is then looked at under a microscope. How is this treated? Fatty liver disease is often  caused by other health conditions. Treatment for fatty liver may involve medicines and lifestyle changes to manage conditions such as: Alcoholism. High cholesterol. Diabetes. Being overweight or obese. Follow these instructions at home:  Do not drink alcohol. If you have trouble quitting, ask your health care provider how to safely quit with the help of medicine or a supervised program. This is important to keep your condition from getting worse. Eat a healthy diet as told by your health care provider. Ask your health care provider about working with a dietitian to develop an eating plan. Exercise regularly. This can help you lose weight and control your cholesterol and diabetes. Talk to your health care provider about an exercise plan and which activities are best for you. Take over-the-counter and prescription medicines only as told by your health care provider. Keep all follow-up visits. This is important. Contact a health care provider if: You have trouble controlling your: Blood sugar. This is especially important if you have diabetes. Cholesterol. Drinking of alcohol. Get help right away if: You have abdominal pain. You have jaundice. You have nausea and are vomiting. You vomit blood or material that looks like coffee grounds. You have stools that are black, tar-like, or bloody. Summary Fatty liver disease develops when too much fat builds up in the cells of your liver. Fatty liver disease often causes no symptoms or problems. However, over time, fatty liver can cause inflammation that may lead to more serious liver problems, such as scarring of the liver (cirrhosis). You are more likely to develop this condition if you abuse alcohol, are pregnant, are overweight, have diabetes, have hepatitis, or have high triglyceride or cholesterol levels. Contact your health care provider if you have trouble controlling your blood sugar, cholesterol,   or drinking of alcohol. This information is  not intended to replace advice given to you by your health care provider. Make sure you discuss any questions you have with your healthcare provider. Document Revised: 07/24/2020 Document Reviewed: 07/24/2020 Elsevier Patient Education  2022 [employer]  

## 2021-07-21 NOTE — Progress Notes (Signed)
Subjective:    Patient ID: Allison Parker, female    DOB: Mar 13, 1941, 80 y.o.   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 PT states this is stable at this time.  Pt is followed by Ortho annually for arthritis. Hypertension This is a chronic problem. The current episode started more than 1 year ago. The problem has been waxing and waning since onset. The problem is uncontrolled. Associated symptoms include anxiety, malaise/fatigue and peripheral edema. Pertinent negatives include no shortness of breath. The current treatment provides mild improvement. Identifiable causes of hypertension include a thyroid problem.  Thyroid Problem Presents for follow-up visit. Symptoms include anxiety, dry skin and fatigue. Patient reports no constipation or depressed mood. The symptoms have been stable. Her past medical history is significant for hyperlipidemia.  Urinary Frequency  This is a chronic problem. The current episode started more than 1 year ago. The problem has been waxing and waning. The quality of the pain is described as aching. Associated symptoms include frequency.  Hyperlipidemia This is a chronic problem. The current episode started more than 1 year ago. Exacerbating diseases include obesity. Pertinent negatives include no shortness of breath. Current antihyperlipidemic treatment includes statins. The current treatment provides moderate improvement of lipids. Risk factors for coronary artery disease include dyslipidemia, hypertension, a sedentary lifestyle and post-menopausal.  Insomnia Primary symptoms: difficulty falling asleep, frequent awakening, malaise/fatigue.   The current episode started more than one year. The onset  quality is gradual. The problem occurs intermittently. PMH includes: depression.   Anxiety Presents for follow-up visit. Symptoms include excessive worry, insomnia, irritability, nervous/anxious behavior and restlessness. Patient reports no depressed mood or shortness of breath. Symptoms occur most days. The severity of symptoms is moderate.    Depression        This is a chronic problem.  The current episode started more than 1 year ago.   Associated symptoms include fatigue, insomnia, irritable, restlessness and sad.  Past medical history includes thyroid problem and anxiety.      Review of Systems  Constitutional:  Positive for fatigue, irritability and malaise/fatigue.  Respiratory:  Negative for shortness of breath.   Gastrointestinal:  Negative for constipation.  Genitourinary:  Positive for frequency.  Psychiatric/Behavioral:  Positive for depression. The patient is nervous/anxious and has insomnia.   All other systems reviewed and are negative.     Objective:   Physical Exam Vitals reviewed.  Constitutional:      General: She is irritable. She is not in acute distress.    Appearance: She is well-developed.  HENT:     Head: Normocephalic and atraumatic.     Right Ear: External ear normal.  Eyes:     Pupils: Pupils are equal, round, and reactive to light.  Neck:     Thyroid: No thyromegaly.  Cardiovascular:     Rate and Rhythm: Normal rate and regular rhythm.     Heart sounds: Normal heart sounds. No murmur heard. Pulmonary:     Effort: Pulmonary effort is normal. No respiratory distress.     Breath sounds: Normal breath sounds. No wheezing.  Abdominal:     General: Bowel sounds are normal. There is no distension.     Palpations: Abdomen  is soft.     Tenderness: There is no abdominal tenderness.  Musculoskeletal:        General: No tenderness.     Cervical back: Normal range of motion and neck supple.  Skin:    General: Skin is warm and dry.  Neurological:      Mental Status: She is alert and oriented to person, place, and time.     Cranial Nerves: No cranial nerve deficit.     Motor: Weakness (right sided weakness) present.     Gait: Gait abnormal.     Deep Tendon Reflexes: Reflexes are normal and symmetric.  Psychiatric:        Behavior: Behavior normal.        Thought Content: Thought content normal.        Judgment: Judgment normal.      BP (!) 159/79   Pulse 76   Temp 98 F (36.7 C)   Ht 5' 5"  (1.651 m)   Wt 199 lb (90.3 kg)   BMI 33.12 kg/m      Assessment & Plan:  Allison Parker comes in today with chief complaint of Medical Management of Chronic Issues   Diagnosis and orders addressed:  1. Essential hypertension, benign - BMP8+EGFR  2. Hypothyroidism, unspecified type - BMP8+EGFR  3. Thalamic pain syndrome - BMP8+EGFR - oxyCODONE (ROXICODONE) 5 MG immediate release tablet; Take 1 tablet (5 mg total) by mouth every 8 (eight) hours as needed for severe pain.  Dispense: 90 tablet; Refill: 0 - oxyCODONE (ROXICODONE) 5 MG immediate release tablet; Take 1 tablet (5 mg total) by mouth every 8 (eight) hours as needed.  Dispense: 90 tablet; Refill: 0 - oxyCODONE (ROXICODONE) 5 MG immediate release tablet; Take 1 tablet (5 mg total) by mouth every 8 (eight) hours as needed.  Dispense: 90 tablet; Refill: 0  4. Overactive bladder - BMP8+EGFR  5. Insomnia, unspecified type - BMP8+EGFR  6. Hyperlipidemia, unspecified hyperlipidemia type - BMP8+EGFR  7. Late effect of cerebrovascular accident - BMP8+EGFR  8. GAD (generalized anxiety disorder) - BMP8+EGFR  9. Moderate episode of recurrent major depressive disorder (HCC)  - BMP8+EGFR  10. Chronic bilateral low back pain with left-sided sciatica  - BMP8+EGFR  11. Obesity (BMI 30-39.9)  - BMP8+EGFR - oxyCODONE (ROXICODONE) 5 MG immediate release tablet; Take 1 tablet (5 mg total) by mouth every 8 (eight) hours as needed for severe pain.  Dispense: 90 tablet; Refill:  0 - oxyCODONE (ROXICODONE) 5 MG immediate release tablet; Take 1 tablet (5 mg total) by mouth every 8 (eight) hours as needed.  Dispense: 90 tablet; Refill: 0 - oxyCODONE (ROXICODONE) 5 MG immediate release tablet; Take 1 tablet (5 mg total) by mouth every 8 (eight) hours as needed.  Dispense: 90 tablet; Refill: 0  12. Uncomplicated opioid dependence (HCC)  - BMP8+EGFR - oxyCODONE (ROXICODONE) 5 MG immediate release tablet; Take 1 tablet (5 mg total) by mouth every 8 (eight) hours as needed for severe pain.  Dispense: 90 tablet; Refill: 0 - oxyCODONE (ROXICODONE) 5 MG immediate release tablet; Take 1 tablet (5 mg total) by mouth every 8 (eight) hours as needed.  Dispense: 90 tablet; Refill: 0 - oxyCODONE (ROXICODONE) 5 MG immediate release tablet; Take 1 tablet (5 mg total) by mouth every 8 (eight) hours as needed.  Dispense: 90 tablet; Refill: 0  13. Pain medication agreement signed  - BMP8+EGFR  14. Vitamin D deficiency  - BMP8+EGFR  15. Elevated liver enzymes  - BMP8+EGFR - Hepatic function  panel - Gamma GT  Will increase Oxycodone 5 mg to TID from BID.  Labs pending Patient reviewed in [place] controlled database, no flags noted. Contract and drug screen are up to date.  Health Maintenance reviewed Diet and exercise encouraged  Follow up plan: 3 months    Evelina Dun, FNP

## 2021-07-22 LAB — HEPATIC FUNCTION PANEL
ALT: 15 IU/L (ref 0–32)
AST: 21 IU/L (ref 0–40)
Albumin: 4.6 g/dL (ref 3.7–4.7)
Alkaline Phosphatase: 109 IU/L (ref 44–121)
Bilirubin Total: 0.4 mg/dL (ref 0.0–1.2)
Bilirubin, Direct: 0.13 mg/dL (ref 0.00–0.40)
Total Protein: 6.9 g/dL (ref 6.0–8.5)

## 2021-07-22 LAB — BMP8+EGFR
BUN/Creatinine Ratio: 24 (ref 12–28)
BUN: 20 mg/dL (ref 8–27)
CO2: 24 mmol/L (ref 20–29)
Calcium: 9.8 mg/dL (ref 8.7–10.3)
Chloride: 100 mmol/L (ref 96–106)
Creatinine, Ser: 0.83 mg/dL (ref 0.57–1.00)
Glucose: 97 mg/dL (ref 70–99)
Potassium: 4.6 mmol/L (ref 3.5–5.2)
Sodium: 140 mmol/L (ref 134–144)
eGFR: 73 mL/min/{1.73_m2} (ref >59)

## 2021-07-22 LAB — GAMMA GT: GGT: 36 IU/L (ref 0–60)

## 2021-09-09 ENCOUNTER — Other Ambulatory Visit: Payer: Self-pay | Admitting: Family

## 2021-09-09 DIAGNOSIS — G890 Central pain syndrome: Secondary | ICD-10-CM

## 2021-09-09 DIAGNOSIS — E785 Hyperlipidemia, unspecified: Secondary | ICD-10-CM

## 2021-09-09 DIAGNOSIS — E039 Hypothyroidism, unspecified: Secondary | ICD-10-CM

## 2021-09-09 DIAGNOSIS — G8929 Other chronic pain: Secondary | ICD-10-CM

## 2021-10-20 ENCOUNTER — Ambulatory Visit (INDEPENDENT_AMBULATORY_CARE_PROVIDER_SITE_OTHER): Payer: Medicare Other | Admitting: Family

## 2021-10-20 ENCOUNTER — Encounter: Payer: Self-pay | Admitting: Family

## 2021-10-20 VITALS — BP 131/71 | HR 91 | Temp 97.5°F | Ht 65.0 in | Wt 204.0 lb

## 2021-10-20 DIAGNOSIS — I10 Essential (primary) hypertension: Secondary | ICD-10-CM | POA: Diagnosis not present

## 2021-10-20 DIAGNOSIS — E785 Hyperlipidemia, unspecified: Secondary | ICD-10-CM

## 2021-10-20 DIAGNOSIS — I6930 Unspecified sequelae of cerebral infarction: Secondary | ICD-10-CM

## 2021-10-20 DIAGNOSIS — G890 Central pain syndrome: Secondary | ICD-10-CM | POA: Diagnosis not present

## 2021-10-20 DIAGNOSIS — B372 Candidiasis of skin and nail: Secondary | ICD-10-CM

## 2021-10-20 DIAGNOSIS — F331 Major depressive disorder, recurrent, moderate: Secondary | ICD-10-CM

## 2021-10-20 DIAGNOSIS — E039 Hypothyroidism, unspecified: Secondary | ICD-10-CM

## 2021-10-20 DIAGNOSIS — F1120 Opioid dependence, uncomplicated: Secondary | ICD-10-CM | POA: Diagnosis not present

## 2021-10-20 DIAGNOSIS — G8929 Other chronic pain: Secondary | ICD-10-CM

## 2021-10-20 DIAGNOSIS — N3281 Overactive bladder: Secondary | ICD-10-CM

## 2021-10-20 DIAGNOSIS — M5442 Lumbago with sciatica, left side: Secondary | ICD-10-CM

## 2021-10-20 DIAGNOSIS — F411 Generalized anxiety disorder: Secondary | ICD-10-CM

## 2021-10-20 DIAGNOSIS — E669 Obesity, unspecified: Secondary | ICD-10-CM

## 2021-10-20 DIAGNOSIS — G4700 Insomnia, unspecified: Secondary | ICD-10-CM

## 2021-10-20 DIAGNOSIS — Z0289 Encounter for other administrative examinations: Secondary | ICD-10-CM

## 2021-10-20 MED ORDER — NYSTATIN 100000 UNIT/GM EX POWD: 1.0000 "application " | Freq: Three times a day (TID) | CUTANEOUS | 0 refills | Status: DC

## 2021-10-20 MED ORDER — SOLIFENACIN SUCCINATE 10 MG PO TABS: 10.0000 mg | ORAL_TABLET | Freq: Every day | ORAL | 1 refills | Status: DC

## 2021-10-20 MED ORDER — OXYCODONE HCL 7.5 MG PO TABS: 7.5000 mg | ORAL_TABLET | Freq: Three times a day (TID) | ORAL | 0 refills | Status: DC | PRN

## 2021-10-20 MED ORDER — NYSTATIN 100000 UNIT/GM EX CREA: 1.0000 "application " | TOPICAL_CREAM | Freq: Two times a day (BID) | CUTANEOUS | 1 refills | Status: DC

## 2021-10-20 MED ORDER — OXYCODONE HCL 7.5 MG PO TABS: 7.5000 mg | ORAL_TABLET | Freq: Three times a day (TID) | ORAL | 0 refills | Status: DC

## 2021-10-20 NOTE — Patient Instructions (Signed)

## 2021-10-20 NOTE — Progress Notes (Signed)
Subjective:    Patient ID: Allison Parker, female    DOB: [birth year], 80 y.o.   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 PT states this is stable at this time.  Pt is followed by Ortho annually for arthritis. Hypertension This is a chronic problem. The current episode started more than 1 year ago. The problem has been resolved since onset. The problem is controlled. Associated symptoms include anxiety, malaise/fatigue, peripheral edema and shortness of breath. Risk factors for coronary artery disease include dyslipidemia, obesity and sedentary lifestyle. The current treatment provides moderate improvement. Identifiable causes of hypertension include a thyroid problem.  Thyroid Problem Presents for follow-up visit. Symptoms include anxiety, dry skin and fatigue. Patient reports no constipation. The symptoms have been stable. Her past medical history is significant for hyperlipidemia.  Urinary Frequency  This is a chronic problem. The current episode started more than 1 year ago. The problem occurs intermittently. The patient is experiencing no pain. Associated symptoms include frequency and urgency. The treatment provided mild relief.  Insomnia Primary symptoms: sleep disturbance, difficulty falling asleep, malaise/fatigue.   PMH includes: depression.   Hyperlipidemia This is a chronic problem. The current episode started more than 1 year ago. Exacerbating diseases include obesity. Associated symptoms include shortness of breath. Current antihyperlipidemic treatment includes statins. The current treatment provides moderate improvement of lipids. Risk factors for coronary artery disease include dyslipidemia, hypertension and a  sedentary lifestyle.  Anxiety Presents for follow-up visit. Symptoms include excessive worry, insomnia, irritability, nervous/anxious behavior, restlessness and shortness of breath. The severity of symptoms is moderate.    Depression        This is a chronic problem.  The current episode started more than 1 year ago.   The problem occurs intermittently.  Associated symptoms include fatigue, insomnia, irritable and restlessness.  Associated symptoms include no helplessness, no hopelessness and not sad.  Past treatments include SSRIs - Selective serotonin reuptake inhibitors.  Past medical history includes thyroid problem and anxiety.   Back Pain This is a chronic problem. The current episode started more than 1 year ago. The pain is present in the lumbar spine and thoracic spine. The pain is moderate.     Review of Systems  Constitutional:  Positive for fatigue, irritability and malaise/fatigue.  Respiratory:  Positive for shortness of breath.   Gastrointestinal:  Negative for constipation.  Genitourinary:  Positive for frequency and urgency.  Musculoskeletal:  Positive for back pain.  Psychiatric/Behavioral:  Positive for depression and sleep disturbance. The patient is nervous/anxious and has insomnia.   All other systems reviewed and are negative.     Objective:   Physical Exam Vitals reviewed.  Constitutional:      General: She is irritable. She is not in acute distress.    Appearance: She is well-developed. She is obese.  HENT:     Head: Normocephalic and atraumatic.     Right Ear: Tympanic membrane normal.     Left Ear: Tympanic membrane normal.  Eyes:     Pupils: Pupils are equal, round, and reactive to light.  Neck:     Thyroid: No thyromegaly.  Cardiovascular:     Rate and Rhythm: Normal rate and regular rhythm.  Heart sounds: Normal heart sounds. No murmur heard. Pulmonary:     Effort: Pulmonary effort is normal. No respiratory distress.     Breath sounds: Normal  breath sounds. No wheezing.  Abdominal:     General: Bowel sounds are normal. There is no distension.     Palpations: Abdomen is soft.     Tenderness: There is no abdominal tenderness.  Musculoskeletal:        General: No tenderness.     Cervical back: Normal range of motion and neck supple.     Right lower leg: Edema (2+) present.     Left lower leg: Edema (trace) present.     Comments: Right sided weakness, using cane   Skin:    General: Skin is warm and dry.  Neurological:     Mental Status: She is alert and oriented to person, place, and time.     Cranial Nerves: No cranial nerve deficit.     Motor: Weakness present.     Coordination: Coordination abnormal.     Gait: Gait abnormal.     Deep Tendon Reflexes: Reflexes are normal and symmetric.  Psychiatric:        Behavior: Behavior normal.        Thought Content: Thought content normal.        Judgment: Judgment normal.      BP 131/71    Pulse 91    Temp (!) 97.5 F (36.4 C) (Temporal)    Ht 5\' 5"  (1.651 m)    Wt 204 lb (92.5 kg)    BMI 33.95 kg/m      Assessment & Plan:  Allison Parker comes in today with chief complaint of Medical Management of Chronic Issues   Diagnosis and orders addressed:  1. Thalamic pain syndrome - oxyCODONE HCl 7.5 MG TABS; Take 7.5 mg by mouth every 8 (eight) hours as needed.  Dispense: 90 tablet; Refill: 0 - oxyCODONE HCl 7.5 MG TABS; Take 7.5 mg by mouth every 8 (eight) hours as needed.  Dispense: 90 tablet; Refill: 0 - oxyCODONE HCl 7.5 MG TABS; Take 7.5 mg by mouth every 8 (eight) hours.  Dispense: 90 tablet; Refill: 0  2. Obesity (BMI 30-39.9)  3. Uncomplicated opioid dependence (HCC) - oxyCODONE HCl 7.5 MG TABS; Take 7.5 mg by mouth every 8 (eight) hours as needed.  Dispense: 90 tablet; Refill: 0 - oxyCODONE HCl 7.5 MG TABS; Take 7.5 mg by mouth every 8 (eight) hours as needed.  Dispense: 90 tablet; Refill: 0 - oxyCODONE HCl 7.5 MG TABS; Take 7.5 mg by mouth every 8 (eight) hours.   Dispense: 90 tablet; Refill: 0  4. Essential hypertension, benign  5. Hypothyroidism, unspecified type  6. Overactive bladder - solifenacin (VESICARE) 10 MG tablet; Take 1 tablet (10 mg total) by mouth daily.  Dispense: 90 tablet; Refill: 1  7. Chronic bilateral low back pain with left-sided sciatica  8. Moderate episode of recurrent major depressive disorder ([place])  9. GAD (generalized anxiety disorder)  10. Hyperlipidemia, unspecified hyperlipidemia type  11. Pain medication agreement signed  12. Late effect of cerebrovascular accident  21. Insomnia, unspecified type  14. Candidal skin infection - nystatin (MYCOSTATIN/NYSTOP) powder; Apply 1 application topically 3 (three) times daily.  Dispense: 15 g; Refill: 0 - nystatin cream (MYCOSTATIN); Apply 1 application topically 2 (two) times daily.  Dispense: 60 g; Refill: 1  Will increase oxycodone to 7.5 mg from 5 mg  Patient reviewed in [place] controlled database, no flags noted. Contract  and drug screen are up to date.  Will increase Vesicare to 10 mg from 5 mg  Health Maintenance reviewed Diet and exercise encouraged  Follow up plan: 3 months    Evelina Dun, FNP

## 2021-10-23 ENCOUNTER — Telehealth: Payer: Self-pay | Admitting: Family

## 2021-10-23 NOTE — Telephone Encounter (Signed)
Husband would like for patient to stay on the oxy 5mg  not the 7.5- can this be changed?

## 2021-10-23 NOTE — Telephone Encounter (Signed)
Pt stated that the pharmacy did not receive any of the medications that were sent on Tuesday. [facility] [place].

## 2021-10-27 ENCOUNTER — Telehealth: Payer: Self-pay | Admitting: Family

## 2021-10-27 ENCOUNTER — Telehealth: Payer: Self-pay

## 2021-10-27 NOTE — Telephone Encounter (Signed)
Left message for pt to return call.  Not sure if pharmacy needs a PA?  Did pt change insurance? Pt should call insurance.  Advised pt that we would watch for a PA if it is required. Does not look like pt has needed a PA in the past.  Per last note. Pts husband had requested the 5mg  oxy. [facility] sent in 7.5. Message has been sent to provider to clarify in another encounter on 12/30. It has not been addressed at this time.

## 2021-10-27 NOTE — Telephone Encounter (Signed)
Called and spoke with patient. She wants to stay on Oxycodone 7.5 mg. Her husband called because the pharmacy did not have the 7.5 mg , but the 5 mg. Per pharmacy they should have this in stock today and patient wants to stay on medication.

## 2021-10-27 NOTE — Telephone Encounter (Signed)
[facility]  [facility].  She is willing to switch back to the 5mg  until it becomes available.  I confirmed with pharmacy and they do not have the 7.5mg  and it will not be there tomorrow either per pharmacist.

## 2021-10-28 ENCOUNTER — Telehealth: Payer: Self-pay | Admitting: Family

## 2021-10-28 NOTE — Telephone Encounter (Signed)
Called and spoke with the pharmacy they are not able to get the 7.5. They said patient could go back to 5mg  or up to 10mg . Advised patient what pharmacy said. Patient understood and said she would like Christy to sent in the oxy 5mg . And discuss going up at next visit please advise. Aware Alyse Low will advise in the morning when she gets in.

## 2021-10-28 NOTE — Telephone Encounter (Signed)
Pt returning call

## 2021-10-29 ENCOUNTER — Telehealth: Payer: Self-pay | Admitting: Family

## 2021-10-29 MED ORDER — OXYCODONE HCL 5 MG PO TABS: 5.0000 mg | ORAL_TABLET | Freq: Four times a day (QID) | ORAL | 0 refills | Status: DC | PRN

## 2021-10-29 MED ORDER — OXYCODONE HCL 5 MG PO TABS: 5.0000 mg | ORAL_TABLET | ORAL | 0 refills | Status: DC | PRN

## 2021-10-29 NOTE — Telephone Encounter (Signed)
Prescription sent to pharmacy.

## 2021-10-29 NOTE — Telephone Encounter (Signed)
Oxycodone 5 mg Prescription sent to pharmacy

## 2021-10-29 NOTE — Telephone Encounter (Signed)
Patient aware and verbalized understanding. °

## 2021-11-25 ENCOUNTER — Other Ambulatory Visit: Payer: Self-pay | Admitting: Family

## 2021-11-25 DIAGNOSIS — N3281 Overactive bladder: Secondary | ICD-10-CM

## 2021-11-25 DIAGNOSIS — E785 Hyperlipidemia, unspecified: Secondary | ICD-10-CM

## 2021-11-25 DIAGNOSIS — F331 Major depressive disorder, recurrent, moderate: Secondary | ICD-10-CM

## 2021-11-25 DIAGNOSIS — F411 Generalized anxiety disorder: Secondary | ICD-10-CM

## 2021-12-03 ENCOUNTER — Other Ambulatory Visit: Payer: Self-pay | Admitting: Orthopedic Surgery

## 2021-12-14 ENCOUNTER — Telehealth: Payer: Self-pay | Admitting: Cardiology

## 2021-12-14 NOTE — Telephone Encounter (Signed)
° °  Name: Allison Parker  DOB: 1941-09-18  MRN: [id number]  Primary Cardiologist: Buford Dresser, MD  Chart reviewed as part of pre-operative protocol coverage. Because of [name]'s past medical history and time since last visit, she will require a follow-up visit in order to better assess preoperative cardiovascular risk.  We have not seen her since 2019.  Pre-op covering staff: - Please schedule appointment and call patient to inform them. If patient already had an upcoming appointment within acceptable timeframe, please add "pre-op clearance" to the appointment notes so provider is aware. - Please contact requesting surgeon's office via preferred method (i.e, phone, fax) to inform them of need for appointment prior to surgery.  If applicable, this message will also be routed to pharmacy pool and/or primary cardiologist for input on holding anticoagulant/antiplatelet agent as requested below so that this information is available to the clearing provider at time of patient's appointment.   Ledora Bottcher, PA  12/14/2021, 4:17 PM

## 2021-12-14 NOTE — Telephone Encounter (Signed)
I s/w the pt and informed her that she is going to need a NEW PT APPT to re-est for pre op clearance. Pt is agreeable to plan of care. Pt asked if any new pt appts at [facility] location, I informed her that I did not have NEW PT appt before her surgery date. Pt is agreeable to [facility] office. Pt scheduled to see Dr. Phineas Inches on 12/23/21 @ 9:40. Pt thanked me for the call and the help. I assured the pt that I will update the surgeon's office as well the she has appt 12/23/21. I will send clearance notes to MD for upcoming appt.

## 2021-12-14 NOTE — Telephone Encounter (Signed)
° °  Pre-operative Risk Assessment    Patient Name: Allison Parker  DOB: 1941-02-23 MRN: [id number]      Request for Surgical Clearance    Procedure:   Right knew Replacement   Date of Surgery:  Clearance  01/04/2022                                  Surgeon:  Dr Frederik Pear  Surgeon's Group or Practice Name:  [facility]  Phone number:  [phone] Fax number:  [phone]   Type of Clearance Requested:   - Medical    Type of Anesthesia:  Spinal   Additional requests/questions:    SignedDenver Faster   12/14/2021, 12:11 PM

## 2021-12-14 NOTE — Telephone Encounter (Signed)
I will send a message to our chart prep team to prep chart for new pt appt.

## 2021-12-21 ENCOUNTER — Ambulatory Visit (INDEPENDENT_AMBULATORY_CARE_PROVIDER_SITE_OTHER): Payer: Medicare Other | Admitting: Family

## 2021-12-21 ENCOUNTER — Encounter: Payer: Self-pay | Admitting: Family

## 2021-12-21 VITALS — BP 132/75 | HR 87 | Temp 97.5°F | Ht 65.0 in | Wt 198.2 lb

## 2021-12-21 DIAGNOSIS — M25561 Pain in right knee: Secondary | ICD-10-CM

## 2021-12-21 DIAGNOSIS — G8929 Other chronic pain: Secondary | ICD-10-CM

## 2021-12-21 DIAGNOSIS — I6930 Unspecified sequelae of cerebral infarction: Secondary | ICD-10-CM

## 2021-12-21 DIAGNOSIS — E669 Obesity, unspecified: Secondary | ICD-10-CM

## 2021-12-21 DIAGNOSIS — N3281 Overactive bladder: Secondary | ICD-10-CM | POA: Diagnosis not present

## 2021-12-21 DIAGNOSIS — I10 Essential (primary) hypertension: Secondary | ICD-10-CM

## 2021-12-21 DIAGNOSIS — Z01818 Encounter for other preprocedural examination: Secondary | ICD-10-CM | POA: Diagnosis not present

## 2021-12-21 MED ORDER — MIRABEGRON ER 50 MG PO TB24: 50.0000 mg | ORAL_TABLET | Freq: Every day | ORAL | 1 refills | Status: DC

## 2021-12-21 NOTE — Progress Notes (Signed)
Subjective:    Patient ID: Allison Parker, female    DOB: 1941/04/25, 81 y.o.   MRN: [id number]  Chief Complaint  Patient presents with   Pre-op Exam   Pt presents to the office today for surgical clearance for right knee replacement. She has this  scheduled for 01/04/22. She has an appointment with Cardiologists on 12/23/21  to be cleared.   She has CBC and BMP pending.   She has hx of CVA with right sided weakness.   She reports her insurance sent her papers stating her vesicare was no longer covered and will need something else.  Knee Pain  The incident occurred more than 1 week ago. There was no injury mechanism. The pain is at a severity of 10/10 (when standing). The pain is moderate. The pain has been Constant since onset. Associated symptoms include an inability to bear weight, a loss of motion and muscle weakness. Pertinent negatives include no loss of sensation, numbness or tingling. She reports no foreign bodies present. The symptoms are aggravated by movement and weight bearing. Treatments tried: oxycodone and gabapentin. The treatment provided mild relief.  Urinary Frequency  This is a chronic problem. The current episode started more than 1 year ago. The problem occurs intermittently. The problem has been waxing and waning. The patient is experiencing no pain. Associated symptoms include frequency.     Review of Systems  Genitourinary:  Positive for frequency.  Neurological:  Negative for tingling and numbness.  All other systems reviewed and are negative.     Objective:   Physical Exam Vitals reviewed.  Constitutional:      General: She is not in acute distress.    Appearance: She is well-developed. She is obese.  HENT:     Head: Normocephalic and atraumatic.  Eyes:     Pupils: Pupils are equal, round, and reactive to light.  Neck:     Thyroid: No thyromegaly.  Cardiovascular:     Rate and Rhythm: Normal rate and regular rhythm.     Heart sounds: Normal  heart sounds. No murmur heard. Pulmonary:     Effort: Pulmonary effort is normal. No respiratory distress.     Breath sounds: Normal breath sounds. No wheezing.  Abdominal:     General: Bowel sounds are normal. There is no distension.     Palpations: Abdomen is soft.     Tenderness: There is no abdominal tenderness.  Musculoskeletal:        General: Tenderness (right knee with flexion and extension) present.     Cervical back: Normal range of motion and neck supple.     Comments: Right sided weakness  Skin:    General: Skin is warm and dry.  Neurological:     Mental Status: She is alert and oriented to person, place, and time.     Cranial Nerves: No cranial nerve deficit.     Motor: Weakness (using rolling walker) present.     Gait: Gait abnormal.     Deep Tendon Reflexes: Reflexes are normal and symmetric.  Psychiatric:        Behavior: Behavior normal.        Thought Content: Thought content normal.        Judgment: Judgment normal.      BP 132/75    Pulse 87    Temp (!) 97.5 F (36.4 C) (Temporal)    Ht 5\' 5"  (1.651 m)    Wt 198 lb 3.2 oz (89.9 kg)  SpO2 97%    BMI 32.98 kg/m      Assessment & Plan:  Allison Parker comes in today with chief complaint of Pre-op Exam   Diagnosis and orders addressed:  1. Essential hypertension, benign  2. Overactive bladder Will change vesicare 10 mgto myrbetriq 50 mg  Avoid caffeine  - mirabegron ER (MYRBETRIQ) 50 MG TB24 tablet; Take 1 tablet (50 mg total) by mouth daily.  Dispense: 90 tablet; Refill: 1  3. Late effect of cerebrovascular accident  4. Obesity (BMI 30-39.9)  5. Chronic pain of right knee  6. Pre-operative clearance    Labs pending Keep follow up with Cardiologists  Health Maintenance reviewed Diet and exercise encouraged  Follow up plan: Keep chronic follow up   Evelina Dun, FNP

## 2021-12-21 NOTE — Progress Notes (Signed)
Your procedure is scheduled on:    01/04/2022   Report to [facility] Main  Entrance   Report to admitting at     0515            AM DO NOT [place], PICTURE ID OR WALLET DAY OF SURGERY.      Call this number if you have problems the morning of surgery [phone]    REMEMBER: NO  SOLID FOODS , CANDY, GUM OR MINTS AFTER [place] .       Marland Kitchen CLEAR LIQUIDS UNTIL     0415AM            DAY OF SURGERY.      PLEASE FINISH ENSURE DRINK PER SURGEON ORDER  WHICH NEEDS TO BE COMPLETED AT   0415AM       MORNING OF SURGERY.       CLEAR LIQUID DIET   Foods Allowed      WATER BLACK COFFEE ( SUGAR [place], NO MILK, CREAM OR CREAMER) REGULAR AND DECAF  TEA ( [place] NO MILK, CREAM, OR CREAMER) REGULAR AND DECAF  PLAIN JELLO ( NO RED)  FRUIT ICES ( NO RED, NO FRUIT PULP)  POPSICLES ( NO RED)  JUICE- APPLE, WHITE GRAPE AND WHITE CRANBERRY  SPORT DRINK LIKE GATORADE ( NO RED)  CLEAR BROTH ( VEGETABLE , CHICKEN OR BEEF)                                                                     BRUSH YOUR TEETH MORNING OF SURGERY AND RINSE YOUR MOUTH OUT, NO CHEWING GUM CANDY OR MINTS.     Take these medicines the morning of surgery with A SIP OF WATER:  AMLODIPINE, CELEXA, GABAPENTIN, SYNTHOFID, VESICARE    DO NOT TAKE ANY DIABETIC MEDICATIONS DAY OF YOUR SURGERY                               You may not have any metal on your body including hair pins and              piercings  Do not wear jewelry, make-up, lotions, powders or perfumes, deodorant             Do not wear nail polish on your fingernails.              IF YOU ARE A FEMALE AND WANT TO SHAVE UNDER ARMS OR LEGS PRIOR TO SURGERY YOU MUST DO SO AT LEAST 48 HOURS PRIOR TO SURGERY.              Men may shave face and neck.   Do not bring valuables to the hospital. [place].  Contacts, dentures or bridgework may not be worn into surgery.  Leave  suitcase in the car. After surgery it may be brought to your room.     Patients discharged the day of surgery will not be allowed to drive home. IF YOU ARE HAVING SURGERY AND GOING HOME THE SAME DAY, YOU MUST HAVE AN ADULT  TO DRIVE YOU HOME AND BE WITH YOU FOR 24 HOURS. YOU MAY GO HOME BY TAXI OR UBER OR ORTHERWISE, BUT AN ADULT MUST ACCOMPANY YOU HOME AND STAY WITH YOU FOR 24 HOURS.                Please read over the following fact sheets you were given: _____________________________________________________________________  [facility] - Preparing for Surgery Before surgery, you can play an important role.  Because skin is not sterile, your skin needs to be as free of germs as possible.  You can reduce the number of germs on your skin by washing with CHG (chlorahexidine gluconate) soap before surgery.  CHG is an antiseptic cleaner which kills germs and bonds with the skin to continue killing germs even after washing. Please DO NOT use if you have an allergy to CHG or antibacterial soaps.  If your skin becomes reddened/irritated stop using the CHG and inform your nurse when you arrive at Short Stay. Do not shave (including legs and underarms) for at least 48 hours prior to the first CHG shower.  You may shave your face/neck. Please follow these instructions carefully:  1.  Shower with CHG Soap the night before surgery and the  morning of Surgery.  2.  If you choose to wash your hair, wash your hair first as usual with your  normal  shampoo.  3.  After you shampoo, rinse your hair and body thoroughly to remove the  shampoo.                           4.  Use CHG as you would any other liquid soap.  You can apply chg directly  to the skin and wash                       Gently with a scrungie or clean washcloth.  5.  Apply the CHG Soap to your body ONLY FROM THE NECK DOWN.   Do not use on face/ open                           Wound or open sores. Avoid contact with eyes, ears mouth and genitals  (private parts).                       Wash face,  Genitals (private parts) with your normal soap.             6.  Wash thoroughly, paying special attention to the area where your surgery  will be performed.  7.  Thoroughly rinse your body with warm water from the neck down.  8.  DO NOT shower/wash with your normal soap after using and rinsing off  the CHG Soap.                9.  Pat yourself dry with a clean towel.            10.  Wear clean pajamas.            11.  Place clean sheets on your bed the night of your first shower and do not  sleep with pets. Day of Surgery : Do not apply any lotions/deodorants the morning of surgery.  Please wear clean clothes to the hospital/surgery center.  FAILURE TO FOLLOW THESE INSTRUCTIONS MAY RESULT IN THE CANCELLATION OF YOUR SURGERY PATIENT SIGNATURE_________________________________  NURSE SIGNATURE__________________________________  ________________________________________________________________________

## 2021-12-21 NOTE — Patient Instructions (Signed)
Preparing for Knee Replacement Getting prepared before knee replacement surgery can make recovery easier and more comfortable. This document provides some tips and guidelines that will help you prepare for your surgery. Talk with your health care provider so you can learn what to expect before, during, and after surgery. Ask questions if you do not understand something. Tell a health care provider about: Any allergies you have. All medicines you are taking, including vitamins, herbs, eye drops, creams, and over-the-counter medicines. Any problems you or family members have had with anesthetic medicines. Any blood disorders you have. Any surgeries you have had. Any medical conditions you have. Whether you are pregnant or may be pregnant. What happens before the procedure? Visit your health care providers Keep all appointments before surgery. You will need to have a physical exam before surgery (preoperative exam) to make sure it is safe for you to have knee replacement surgery. You may also need to have more tests. When you go to the exam, bring a list of all the medicines and supplements, including herbs and vitamins, that you take. Have dental care and routine cleanings done before surgery. Germs from anywhere in your body, including your mouth, can travel to your new joint and infect it. Tell your dentist that you plan to have knee replacement surgery. Know the costs of surgery To find out how much the surgery will cost, call your insurance company as soon as you decide to have surgery. Ask questions like: How much of the surgery and hospital stay will be covered? What will be covered for: Medical equipment? Rehabilitation facilities? Home care? Prepare your home Pick a recovery spot that is not your bed. It is better that you sit more upright during recovery. You may want to use a recliner. Place items that you often use on a small table near your recovery spot. These may include the TV  remote, a cordless phone or your mobile phone, a book, a laptop computer, and a water glass. Move other items you will need to shelves and drawers that are at countertop height. Do this in your kitchen, bathroom, and bedroom. You may be given a walker to use at home. Check if you will have enough room to use a walker. Move around your home with your hands out about 6 inches (15 cm) from your sides. You will have enough room if you do not hit anything with your hands as you do this. Walk from: Your recovery spot to your kitchen and bathroom. Your bed to the bathroom. Prepare some meals to freeze and reheat later. Make your home safe for recovery   Remove all clutter and throw rugs from your floors. This will help you avoid tripping. Consider getting safety equipment that will be helpful during your recovery, such as: Grab bars in the shower and near the toilet. A raised toilet seat. This will help you get on and off the toilet more easily. A tub or shower bench. Prepare your body If you smoke, quit as soon as you can before surgery. If there is time, it is best to quit several months before surgery. Tell your surgeon if you use any products that contain nicotine or tobacco. These products include cigarettes, chewing tobacco, and vaping devices, such as e-cigarettes. These can delay healing. If you need help quitting, ask your health care provider. Talk to your health care provider about doing exercises before your surgery. Doing these exercises in the weeks before your surgery may help reduce pain and  improve function after surgery. Be sure to follow the exercise program given by your health care provider. Maintain a healthy diet. Do not change your diet before surgery unless your health care provider tells you to do that. Do not drink any alcohol for at least 48 hours before surgery. Plan your recovery In the first couple of weeks after surgery, it will be harder for you to do some of your  regular activities. You may get tired easily, and you will have limited movement in your leg. To make sure you have all the help you need after your surgery: Plan to have a responsible adult take you home from the hospital. Your health care provider will tell you how many days you can expect to be in the hospital. Cancel all work, caregiving, and volunteer responsibilities for at least 4-6 weeks after surgery. Plan to have a responsible adult stay with you day and night for the first week. This person should be someone you are comfortable with. You may need this person to help you with your exercises and personal care, such as bathing and using the toilet. If you live alone, arrange for someone to take care of your home and pets for the first 4-6 weeks after surgery. Arrange for drivers to take you to follow-up visits, the grocery store, and other places you may need to go for at least 4-6 weeks. Consider applying for a disability parking permit. To get an application, call your local department of motor vehicles [facility]) or your health care provider's office. Summary Getting prepared before knee replacement surgery can make your recovery easier and more comfortable. Keep all visits to your health care provider before surgery. You will have an exam and may have tests to make sure that you are ready for your surgery. Prepare your home and arrange for help at home. Plan to have a responsible adult take you home from the hospital. Also, plan to have a responsible adult stay with you day and night for the first week after you leave the hospital. This information is not intended to replace advice given to you by your health care provider. Make sure you discuss any questions you have with your health care provider. Document Revised: 04/01/2020 Document Reviewed: 04/01/2020 Elsevier Patient Education  [place].

## 2021-12-22 ENCOUNTER — Encounter (HOSPITAL_COMMUNITY)
Admission: RE | Admit: 2021-12-22 | Discharge: 2021-12-22 | Disposition: A | Discharge status: Home / Self Care | Payer: Medicare Other | Source: Ambulatory Visit | Attending: Orthopedic Surgery | Admitting: Orthopedic Surgery

## 2021-12-22 ENCOUNTER — Ambulatory Visit (HOSPITAL_COMMUNITY)
Admission: RE | Admit: 2021-12-22 | Discharge: 2021-12-22 | Disposition: A | Discharge status: Home / Self Care | Payer: Medicare Other | Source: Ambulatory Visit | Attending: Orthopedic Surgery | Admitting: Orthopedic Surgery

## 2021-12-22 ENCOUNTER — Other Ambulatory Visit: Payer: Self-pay

## 2021-12-22 ENCOUNTER — Encounter (HOSPITAL_COMMUNITY): Payer: Self-pay

## 2021-12-22 ENCOUNTER — Ambulatory Visit: Payer: Medicare Other | Admitting: Family

## 2021-12-22 VITALS — BP 141/69 | HR 77 | Temp 98.7°F | Resp 16 | Ht 65.0 in | Wt 227.0 lb

## 2021-12-22 DIAGNOSIS — I4510 Unspecified right bundle-branch block: Secondary | ICD-10-CM | POA: Insufficient documentation

## 2021-12-22 DIAGNOSIS — Z01818 Encounter for other preprocedural examination: Secondary | ICD-10-CM | POA: Diagnosis present

## 2021-12-22 HISTORY — DX: Other specified postprocedural states: Z98.890

## 2021-12-22 HISTORY — DX: Other complications of anesthesia, initial encounter: T88.59XA

## 2021-12-22 HISTORY — DX: Other specified postprocedural states: R11.2

## 2021-12-22 HISTORY — DX: Unspecified macular degeneration: H35.30

## 2021-12-22 HISTORY — DX: Unspecified osteoarthritis, unspecified site: M19.90

## 2021-12-22 HISTORY — DX: Hypothyroidism, unspecified: E03.9

## 2021-12-22 LAB — SURGICAL PCR SCREEN
MRSA, PCR: NEGATIVE
Staphylococcus aureus: POSITIVE — AB

## 2021-12-22 LAB — CBC
HCT: 42.7 % (ref 36.0–46.0)
Hemoglobin: 14.1 g/dL (ref 12.0–15.0)
MCH: 30.1 pg (ref 26.0–34.0)
MCHC: 33 g/dL (ref 30.0–36.0)
MCV: 91 fL (ref 80.0–100.0)
Platelets: 246 10*3/uL (ref 150–400)
RBC: 4.69 MIL/uL (ref 3.87–5.11)
RDW: 12.8 % (ref 11.5–15.5)
WBC: 8.4 10*3/uL (ref 4.0–10.5)
nRBC: 0 % (ref 0.0–0.2)

## 2021-12-22 LAB — BASIC METABOLIC PANEL
Anion gap: 7 (ref 5–15)
BUN: 20 mg/dL (ref 8–23)
CO2: 28 mmol/L (ref 22–32)
Calcium: 9.7 mg/dL (ref 8.9–10.3)
Chloride: 99 mmol/L (ref 98–111)
Creatinine, Ser: 0.76 mg/dL (ref 0.44–1.00)
GFR, Estimated: >60 mL/min (ref >60)
Glucose, Bld: 105 mg/dL — ABNORMAL HIGH (ref 70–99)
Potassium: 4.2 mmol/L (ref 3.5–5.1)
Sodium: 134 mmol/L — ABNORMAL LOW (ref 135–145)

## 2021-12-22 LAB — TYPE AND SCREEN
ABO/RH(D): O POS
Antibody Screen: NEGATIVE

## 2021-12-22 NOTE — Progress Notes (Addendum)
Anesthesia Review:  PCP: Riki Sheer LOv 12/21/21  Cardiologist : To see DR Phineas Inches for clearance on 12/23/21  at 0940 am  [place] in [place] note.  Chest x-ray :12/22/21  EKG : 12/22/21  Echo : Stress test: Ct cors- 2019  Cardiac Cath :  Activity level: cannot do a flght of stairs without difficulty  Sleep Study/ CPAP : none  Fasting Blood Sugar :      / Checks Blood Sugar -- times a day:   Blood Thinner/ Instructions /Last Dose: ASA / Instructions/ Last Dose :   Hx of stroke in 1997 affected right side - pt reports cannot do fine motor skills, pt unable to sign name .   Husband present at preop and signed consent form.   81 mg aspirin  No covid test- ambulatory surgery

## 2021-12-23 ENCOUNTER — Ambulatory Visit (INDEPENDENT_AMBULATORY_CARE_PROVIDER_SITE_OTHER): Payer: Medicare Other | Admitting: Internal Medicine

## 2021-12-23 ENCOUNTER — Encounter: Payer: Self-pay | Admitting: Internal Medicine

## 2021-12-23 VITALS — BP 137/69 | HR 76 | Ht 65.0 in | Wt 226.0 lb

## 2021-12-23 DIAGNOSIS — Z01810 Encounter for preprocedural cardiovascular examination: Secondary | ICD-10-CM | POA: Diagnosis not present

## 2021-12-23 NOTE — Progress Notes (Signed)
Anesthesia Chart Review   Case: [id number] Date/Time: 01/04/22 0700   Procedure: RIGHT TOTAL KNEE ARTHROPLASTY (Right: Knee)   Anesthesia type: Spinal   Pre-op diagnosis: RIGHT KNEE OSTEOARTHRITIS   Location: [place] [facility]   Surgeons: Frederik Pear, MD       DISCUSSION:81 y.o. never smoker with h/o PONV, HTN, Stroke, right knee OA scheduled for above procedure 01/04/2022 with Dr. Frederik Pear.   Pt seen by cardiology for preoperative evaluation. Per OV note, "Pre-operative Cardiac Risk Assessment: Allison Parker has chronic deconditioning with her knee pain. She denies angina. She has no hx of MI or CHF. Her coronary CT showed mild RCA disease. She's had no recurrence of chest pain and a stable EKG. She does not have cardiac disease. She is acceptable cardiac risk for surgery. Her blood pressures is well controlled. No medication changes recommended"  Anticipate pt can proceed with planned procedure barring acute status change.   VS: BP (!) 141/69    Pulse 77    Temp 37.1 C (Oral)    Resp 16    Ht 5\' 5"  (1.651 m)    Wt 103 kg    SpO2 96%    BMI 37.77 kg/m   PROVIDERS: Sharion Balloon, FNP is PCP   Phineas Inches, MD is Cardiologist  LABS: Labs reviewed: Acceptable for surgery. (all labs ordered are listed, but only abnormal results are displayed)  Labs Reviewed  SURGICAL PCR SCREEN - Abnormal; Notable for the following components:      Result Value   Staphylococcus aureus POSITIVE (*)    All other components within normal limits  BASIC METABOLIC PANEL - Abnormal; Notable for the following components:   Sodium 134 (*)    Glucose, Bld 105 (*)    All other components within normal limits  CBC  TYPE AND SCREEN     IMAGES:   EKG: 12/22/2021 Rate 74 bpm NSR RBBB   CV:  Past Medical History:  Diagnosis Date   Arthritis    Complication of anesthesia    Depression    History of YAG laser capsulotomy of lens of left eye    Hyperlipidemia    Hypertension     Hypothyroidism    Insomnia    Macular degeneration    PONV (postoperative nausea and vomiting)    Stroke (HCC)    Thyroid disease    hypothyroid    Past Surgical History:  Procedure Laterality Date   BREAST SURGERY     lumpectomy   CATARACT EXTRACTION     CHOLECYSTECTOMY     EYE SURGERY     yag - bilateral, cataracts- bilateral   Torn retina Left    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    MEDICATIONS:  amLODipine (NORVASC) 5 MG tablet   aspirin EC 81 MG tablet   atorvastatin (LIPITOR) 20 MG tablet   b complex vitamins capsule   Calcium Citrate-Vitamin D (CALCIUM + D PO)   citalopram (CELEXA) 40 MG tablet   doxylamine, Sleep, (UNISOM) 25 MG tablet   gabapentin (NEURONTIN) 100 MG capsule   levothyroxine (SYNTHROID) 25 MCG tablet   Melatonin 10 MG CAPS   meloxicam (MOBIC) 7.5 MG tablet   mirabegron ER (MYRBETRIQ) 50 MG TB24 tablet   Multiple Vitamins-Minerals (PRESERVISION AREDS 2) CAPS   nystatin (MYCOSTATIN/NYSTOP) powder   nystatin cream (MYCOSTATIN)   oxyCODONE (ROXICODONE) 5 MG immediate release tablet   [START ON 12/25/2021] oxyCODONE (ROXICODONE) 5 MG immediate release tablet   oxyCODONE (  ROXICODONE) 5 MG immediate release tablet   No current facility-administered medications for this encounter.   Allison Felix Ward, PA-C [facility] [phone]

## 2021-12-23 NOTE — Progress Notes (Signed)
Cardiology Office Note:    Date:  12/23/2021   ID:  Allison Parker, DOB 05-16-1941, MRN [id number]  PCP:  Sharion Balloon, FNP   [facility] Providers Cardiologist:  Janina Mayo, MD     Referring MD: Sharion Balloon, FNP   No chief complaint on file. Pre-op  History of Present Illness:    Allison Parker is a 81 y.o. female with a hx of htn, obesity, ischemic stroke,  right knee replacement 01/04/2022 referral for cardiac risk assessment   She denies chest pain. She chronically has dyspnea. She can't go up stairs with her knee. She thinks she would get out of breath. This is not new.  She has no angina. She denies orthopnea, PND, no LE edema. She has no hx of cardiac disease. She's had no hospitalizations.  Saw Dr. Harrell Gave 2019 for chest pain and RBBB. She described chest discomfort and was planned for coronary CT. CAC score 135. RCA showed mild stenosis.  Family Hx: Brother- MI. No other heart disease family hx.  Social Hx: no smoking hx   Past Medical History:  Diagnosis Date   Arthritis    Complication of anesthesia    Depression    History of YAG laser capsulotomy of lens of left eye    Hyperlipidemia    Hypertension    Hypothyroidism    Insomnia    Macular degeneration    PONV (postoperative nausea and vomiting)    Stroke (HCC)    Thyroid disease    hypothyroid    Past Surgical History:  Procedure Laterality Date   BREAST SURGERY     lumpectomy   CATARACT EXTRACTION     CHOLECYSTECTOMY     EYE SURGERY     yag - bilateral, cataracts- bilateral   Torn retina Left    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    Current Medications: Current Outpatient Medications on File Prior to Visit  Medication Sig Dispense Refill   amLODipine (NORVASC) 5 MG tablet TAKE 1 TABLET BY MOUTH EVERY DAY 90 tablet 1   aspirin EC 81 MG tablet Take 1 tablet (81 mg total) by mouth daily.     atorvastatin (LIPITOR) 20 MG tablet Take 1 tablet (20 mg total) by mouth daily.  (NEEDS TO BE SEEN BEFORE NEXT REFILL) 90 tablet 0   b complex vitamins capsule Take 1 capsule by mouth daily.     Calcium Citrate-Vitamin D (CALCIUM + D PO) Take 1 tablet by mouth daily.     citalopram (CELEXA) 40 MG tablet Take 1 tablet (40 mg total) by mouth daily. (NEEDS TO BE SEEN BEFORE NEXT REFILL) 90 tablet 0   doxylamine, Sleep, (UNISOM) 25 MG tablet Take 25 mg by mouth at bedtime.     gabapentin (NEURONTIN) 100 MG capsule Take 1 capsule (100 mg total) by mouth 3 (three) times daily. (NEEDS TO BE SEEN BEFORE NEXT REFILL) 90 capsule 0   levothyroxine (SYNTHROID) 25 MCG tablet TAKE 1 TABLET BY MOUTH EVERY DAY 90 tablet 1   Melatonin 10 MG CAPS Take 10 mg by mouth at bedtime.     meloxicam (MOBIC) 7.5 MG tablet TAKE 1 TABLET BY MOUTH EVERY DAY 90 tablet 0   mirabegron ER (MYRBETRIQ) 50 MG TB24 tablet Take 1 tablet (50 mg total) by mouth daily. 90 tablet 1   Multiple Vitamins-Minerals (PRESERVISION AREDS 2) CAPS Take 1 capsule by mouth 2 (two) times daily.     nystatin (MYCOSTATIN/NYSTOP) powder Apply 1 application  topically 3 (three) times daily. (Patient taking differently: Apply 1 application topically daily.) 15 g 0   nystatin cream (MYCOSTATIN) Apply 1 application topically 2 (two) times daily. (Patient taking differently: Apply 1 application topically every evening.) 60 g 1   oxyCODONE (ROXICODONE) 5 MG immediate release tablet Take 1 tablet (5 mg total) by mouth every 4 (four) hours as needed for severe pain. 90 tablet 0   [START ON 12/25/2021] oxyCODONE (ROXICODONE) 5 MG immediate release tablet Take 1 tablet (5 mg total) by mouth every 6 (six) hours as needed for severe pain. 90 tablet 0   oxyCODONE (ROXICODONE) 5 MG immediate release tablet Take 1 tablet (5 mg total) by mouth every 6 (six) hours as needed for severe pain. 90 tablet 0   No current facility-administered medications on file prior to visit.    Allergies:   Ace inhibitors   Social History   Socioeconomic History    Marital status: Married    Spouse name: Jimmye Norman   Number of children: 3   Years of education: Not on file   Highest education level: Not on file  Occupational History   Occupation: retired    Fish farm manager: [employer]    Comment: supervisor  Tobacco Use   Smoking status: Never   Smokeless tobacco: Never  Vaping Use   Vaping Use: Never used  Substance and Sexual Activity   Alcohol use: Yes    Alcohol/week: 2.0 standard drinks    Types: 2 Standard drinks or equivalent per week    Comment: occ glass of wine   Drug use: No   Sexual activity: Not on file  Other Topics Concern   Not on file  Social History Narrative   Lives with husband. One child passed away. One son lives next door.   Social Determinants of Health   Financial Resource Strain: Low Risk    Difficulty of Paying Living Expenses: Not very hard  Food Insecurity: No Food Insecurity   Worried About Charity fundraiser in the Last Year: Never true   Ran Out of Food in the Last Year: Never true  Transportation Needs: No Transportation Needs   Lack of Transportation (Medical): No   Lack of Transportation (Non-Medical): No  Physical Activity: Insufficiently Active   Days of Exercise per Week: 4 days   Minutes of Exercise per Session: 20 min  Stress: No Stress Concern Present   Feeling of Stress : Only a little  Social Connections: Moderately Integrated   Frequency of Communication with Friends and Family: More than three times a week   Frequency of Social Gatherings with Friends and Family: More than three times a week   Attends Religious Services: More than 4 times per year   Active Member of [employer] or Organizations: No   Attends Music therapist: Never   Marital Status: Married     Family History: The patient's family history includes Arthritis in her brother, father, and mother; Cancer in her father and paternal grandfather; Dementia in her mother; Heart attack in her brother; Hypertension in her brother;  Other in her paternal grandmother.  ROS:   Please see the history of present illness.     All other systems reviewed and are negative.  EKGs/Labs/Other Studies Reviewed:    The following studies were reviewed today:   EKG:  EKG is  ordered today.  The ekg ordered today demonstrates   NSR, RBBB  Recent Labs: 04/17/2021: TSH 2.300 07/21/2021: ALT 15 12/22/2021: BUN 20; Creatinine,  Ser 0.76; Hemoglobin 14.1; Platelets 246; Potassium 4.2; Sodium 134  Recent Lipid Panel    Component Value Date/Time   CHOL 164 04/17/2021 1222   CHOL 157 02/27/2013 1029   TRIG 143 04/17/2021 1222   TRIG 136 07/03/2013 1016   TRIG 97 02/27/2013 1029   HDL 70 04/17/2021 1222   HDL 74 07/03/2013 1016   HDL 72 02/27/2013 1029   CHOLHDL 2.3 04/17/2021 1222   LDLCALC 70 04/17/2021 1222   LDLCALC 84 07/03/2013 1016   LDLCALC 66 02/27/2013 1029     Risk Assessment/Calculations:           Physical Exam:    VS:    Vitals:   12/23/21 0948  BP: 137/69  Pulse: 76  SpO2: 96%     Wt Readings from Last 3 Encounters:  12/23/21 226 lb (102.5 kg)  12/22/21 227 lb (103 kg)  12/21/21 198 lb 3.2 oz (89.9 kg)     GEN:  Well nourished, well developed in no acute distress HEENT: Normal NECK: No JVD; No carotid bruits LYMPHATICS: No lymphadenopathy CARDIAC: RRR, no murmurs, rubs, gallops RESPIRATORY:  Clear to auscultation without rales, wheezing or rhonchi  ABDOMEN: Soft, non-tender, non-distended MUSCULOSKELETAL:  No edema; No deformity  SKIN: Warm and dry NEUROLOGIC:  Alert and oriented x 3, right arm tremor PSYCHIATRIC:  Normal affect   ASSESSMENT:    #Pre-operative Cardiac Risk Assessment: Mrs Celli has chronic deconditioning with her knee pain. She denies angina. She has no hx of MI or CHF. Her coronary CT showed mild RCA disease. She's had no recurrence of chest pain and a stable EKG. She does not have cardiac disease. She is acceptable cardiac risk for surgery. Her blood pressures is  well controlled. No medication changes recommended  PLAN:    In order of problems listed above:  Acceptable cardiac risk for surgery Follow up 3 months         Medication Adjustments/Labs and Tests Ordered: Current medicines are reviewed at length with the patient today.  Concerns regarding medicines are outlined above.  Orders Placed This Encounter  Procedures   EKG 12-Lead   No orders of the defined types were placed in this encounter.   Patient Instructions  Medication Instructions:  No Changes In Medications at this time.  *If you need a refill on your cardiac medications before your next appointment, please call your pharmacy*  Follow-Up: At [facility], you and your health needs are our priority.  As part of our continuing mission to provide you with exceptional heart care, we have created designated Provider Care Teams.  These Care Teams include your primary Cardiologist (physician) and Advanced Practice Providers (APPs -  Physician Assistants and Nurse Practitioners) who all work together to provide you with the care you need, when you need it.  Your next appointment:   3 month(s)  The format for your next appointment:   In Person  Provider:   Janina Mayo, MD      Signed, Janina Mayo, MD  12/23/2021 10:21 AM    [place]

## 2021-12-23 NOTE — Patient Instructions (Signed)
Medication Instructions:  ?No Changes In Medications at this time.  ?*If you need a refill on your cardiac medications before your next appointment, please call your pharmacy* ? ?Follow-Up: ?At [facility], you and your health needs are our priority.  As part of our continuing mission to provide you with exceptional heart care, we have created designated Provider Care Teams.  These Care Teams include your primary Cardiologist (physician) and Advanced Practice Providers (APPs -  Physician Assistants and Nurse Practitioners) who all work together to provide you with the care you need, when you need it. ? ?Your next appointment:   ?3 month(s) ? ?The format for your next appointment:   ?In Person ? ?Provider:   ?Janina Mayo, MD   ? ?

## 2021-12-28 NOTE — Care Plan (Signed)
Ortho Bundle Case Management Note ? ?Patient Details  ?Name: Allison Parker ?MRN: [id number] ?Date of Birth: 1941/08/07 ? ?SPoke with patient prior to surgery. She will discharge to home with family to assist. Has equipment. HHPT referral to [facility] and oPPT set up with [facility]. Patient and MD in agreement with plan. Choice offered.              ? ? ? ?DME Arranged:    ?DME Agency:    ? ?HH Arranged:  PT ?[place] Agency:  [place] ? ?Additional Comments: ?Please contact me with any questions of if this plan should need to change. ? ?Mardelle Matte  [facility] Orthopaedic Specialist  [phone] ?12/28/2021, 9:38 AM ?  ?

## 2021-12-29 DIAGNOSIS — M1711 Unilateral primary osteoarthritis, right knee: Secondary | ICD-10-CM | POA: Diagnosis present

## 2021-12-29 NOTE — H&P (Signed)
TOTAL KNEE ADMISSION H&P  Patient is being admitted for right total knee arthroplasty.  Subjective:  Chief Complaint:right knee pain.  HPI: Allison Parker, 81 y.o. female, has a history of pain and functional disability in the right knee due to arthritis and has failed non-surgical conservative treatments for greater than 12 weeks to includeNSAID's and/or analgesics, use of assistive devices, weight reduction as appropriate, and activity modification.  Onset of symptoms was gradual, starting 2 years ago with gradually worsening course since that time. The patient noted no past surgery on the right knee(s).  Patient currently rates pain in the right knee(s) at 10 out of 10 with activity. Patient has night pain, worsening of pain with activity and weight bearing, pain that interferes with activities of daily living, pain with passive range of motion, and crepitus.  Patient has evidence of joint space narrowing by imaging studies.  There is no active infection.  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   Pain medication agreement signed 01/05/2016   Opioid dependence ([place]) 01/05/2016   Vitamin D deficiency 11/11/2014   GAD (generalized anxiety disorder) 11/11/2014   Depression 11/11/2014   Hypothyroidism 11/11/2014   Chest pain 08/20/2014   Pedal edema 07/03/2013   Overactive bladder 07/03/2013   Insomnia    Hyperlipidemia 04/06/2010   Essential hypertension, benign 04/06/2010   Late effect of cerebrovascular accident 04/06/2010   NONSPECIFIC ABNORMAL RESULTS LIVR FUNCTION STUDY 05/01/2009   Past Medical History:  Diagnosis Date   Arthritis    Complication of anesthesia    Depression    History of YAG laser capsulotomy of lens of left eye    Hyperlipidemia    Hypertension    Hypothyroidism    Insomnia     Macular degeneration    PONV (postoperative nausea and vomiting)    Stroke ([place])    Thyroid disease    hypothyroid    Past Surgical History:  Procedure Laterality Date   BREAST SURGERY     lumpectomy   CATARACT EXTRACTION     CHOLECYSTECTOMY     EYE SURGERY     yag - bilateral, cataracts- bilateral   Torn retina Left    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    No current facility-administered medications for this encounter.   Current Outpatient Medications  Medication Sig Dispense Refill Last Dose   amLODipine (NORVASC) 5 MG tablet TAKE 1 TABLET BY MOUTH EVERY DAY 90 tablet 1    aspirin EC 81 MG tablet Take 1 tablet (81 mg total) by mouth daily.      atorvastatin (LIPITOR) 20 MG tablet Take 1 tablet (20 mg total) by mouth daily. (NEEDS TO BE SEEN BEFORE NEXT REFILL) 90 tablet 0    b complex vitamins capsule Take 1 capsule by mouth daily.      Calcium Citrate-Vitamin D (CALCIUM + D PO) Take 1 tablet by mouth daily.      citalopram (CELEXA) 40 MG tablet Take 1 tablet (40 mg total) by mouth daily. (NEEDS TO BE SEEN BEFORE NEXT REFILL) 90 tablet 0    doxylamine, Sleep, (UNISOM) 25 MG tablet Take 25 mg by mouth at bedtime.      gabapentin (NEURONTIN) 100 MG capsule Take 1 capsule (100 mg total) by mouth 3 (three) times daily. (NEEDS TO BE SEEN BEFORE NEXT  REFILL) 90 capsule 0    levothyroxine (SYNTHROID) 25 MCG tablet TAKE 1 TABLET BY MOUTH EVERY DAY 90 tablet 1    Melatonin 10 MG CAPS Take 10 mg by mouth at bedtime.      meloxicam (MOBIC) 7.5 MG tablet TAKE 1 TABLET BY MOUTH EVERY DAY 90 tablet 0    Multiple Vitamins-Minerals (PRESERVISION AREDS 2) CAPS Take 1 capsule by mouth 2 (two) times daily.      nystatin (MYCOSTATIN/NYSTOP) powder Apply 1 application topically 3 (three) times daily. (Patient taking differently: Apply 1 application topically daily.) 15 g 0    nystatin cream (MYCOSTATIN) Apply 1 application topically 2 (two) times daily. (Patient taking differently: Apply 1  application topically every evening.) 60 g 1    oxyCODONE (ROXICODONE) 5 MG immediate release tablet Take 1 tablet (5 mg total) by mouth every 4 (four) hours as needed for severe pain. 90 tablet 0    mirabegron ER (MYRBETRIQ) 50 MG TB24 tablet Take 1 tablet (50 mg total) by mouth daily. 90 tablet 1    oxyCODONE (ROXICODONE) 5 MG immediate release tablet Take 1 tablet (5 mg total) by mouth every 6 (six) hours as needed for severe pain. 90 tablet 0    oxyCODONE (ROXICODONE) 5 MG immediate release tablet Take 1 tablet (5 mg total) by mouth every 6 (six) hours as needed for severe pain. 90 tablet 0    Allergies  Allergen Reactions   Ace Inhibitors Palpitations    Social History   Tobacco Use   Smoking status: Never   Smokeless tobacco: Never  Substance Use Topics   Alcohol use: Yes    Alcohol/week: 2.0 standard drinks    Types: 2 Standard drinks or equivalent per week    Comment: occ glass of wine    Family History  Problem Relation Age of Onset   Cancer Father        Lung   Arthritis Father    Dementia Mother    Arthritis Mother    Hypertension Brother    Other Paternal Grandmother        eye tumor   Cancer Paternal Grandfather        prostate   Arthritis Brother    Heart attack Brother      Review of Systems  Constitutional: Negative.   HENT: Negative.    Eyes: Negative.   Respiratory: Negative.    Cardiovascular: Negative.   Gastrointestinal: Negative.   Endocrine: Negative.   Genitourinary: Negative.   Musculoskeletal:  Positive for arthralgias and myalgias.  Skin: Negative.   Neurological:  Positive for tremors.  Hematological: Negative.   Psychiatric/Behavioral: Negative.     Objective:  Physical Exam Constitutional:      Appearance: Normal appearance. She is obese.  HENT:     Head: Normocephalic and atraumatic.     Nose: Nose normal.     Mouth/Throat:     Mouth: Mucous membranes are moist.     Pharynx: Oropharynx is clear.  Eyes:     Pupils: Pupils  are equal, round, and reactive to light.  Cardiovascular:     Pulses: Normal pulses.  Pulmonary:     Effort: Pulmonary effort is normal.  Musculoskeletal:        General: Tenderness present.     Cervical back: Normal range of motion and neck supple.     Comments: the patient's right knee does have limited range of motion from 5 to 95.  Diffuse tenderness over the medial lateral joint  lines.  No instability with valgus or varus stress.  Mild effusion.  No erythema or warmth.  Calves are soft and nontender.  Patient's left knee has a range from near 0-115.  Skin:    General: Skin is warm and dry.  Neurological:     Mental Status: She is alert and oriented to person, place, and time. Mental status is at baseline.  Psychiatric:        Mood and Affect: Mood normal.        Behavior: Behavior normal.        Thought Content: Thought content normal.        Judgment: Judgment normal.    Vital signs in last 24 hours:    Labs:   Estimated body mass index is 37.61 kg/m as calculated from the following:   Height as of 12/23/21: '5\' 5"'$  (1.651 m).   Weight as of 12/23/21: 102.5 kg.   Imaging Review Plain radiographs demonstrate bilateral AP weightbearing, bilateral Rosenberg, lateral sunrise views of the right and left knee are taken and reviewed in office today.  This shows a well-placed well fixed left total knee arthroplasty.  The patient's right knee does have severe arthritis near bone-on-bone lateral compartment with extruded lateral meniscus and chondrocalcinosis.      Assessment/Plan:  End stage arthritis, right knee   The patient history, physical examination, clinical judgment of the provider and imaging studies are consistent with end stage degenerative joint disease of the right knee(s) and total knee arthroplasty is deemed medically necessary. The treatment options including medical management, injection therapy arthroscopy and arthroplasty were discussed at length. The risks  and benefits of total knee arthroplasty were presented and reviewed. The risks due to aseptic loosening, infection, stiffness, patella tracking problems, thromboembolic complications and other imponderables were discussed. The patient acknowledged the explanation, agreed to proceed with the plan and consent was signed. Patient is being admitted for inpatient treatment for surgery, pain control, PT, OT, prophylactic antibiotics, VTE prophylaxis, progressive ambulation and ADL's and discharge planning. The patient is planning to be discharged home with home health services     Patient's anticipated LOS is less than 2 midnights, meeting these requirements: - Younger than 65 - Lives within 1 hour of care - Has a competent adult at home to recover with post-op recover - NO history of  - Chronic pain requiring opiods  - Diabetes  - Coronary Artery Disease  - Heart failure  - Heart attack  - Stroke  - DVT/VTE  - Cardiac arrhythmia  - Respiratory Failure/COPD  - Renal failure  - Anemia  - Advanced Liver disease

## 2022-01-04 ENCOUNTER — Ambulatory Visit (HOSPITAL_BASED_OUTPATIENT_CLINIC_OR_DEPARTMENT_OTHER): Payer: Medicare Other | Admitting: Certified Registered Nurse Anesthetist

## 2022-01-04 ENCOUNTER — Ambulatory Visit (HOSPITAL_COMMUNITY): Payer: Medicare Other | Admitting: Physician Assistant

## 2022-01-04 ENCOUNTER — Other Ambulatory Visit: Payer: Self-pay

## 2022-01-04 ENCOUNTER — Inpatient Hospital Stay (HOSPITAL_COMMUNITY)
Admission: RE | Admit: 2022-01-04 | Discharge: 2022-01-06 | DRG: 470 | Disposition: A | Discharge status: Home Health | Payer: Medicare Other | Attending: Orthopedic Surgery | Admitting: Orthopedic Surgery

## 2022-01-04 ENCOUNTER — Encounter (HOSPITAL_COMMUNITY)
Admission: RE | Disposition: A | Discharge status: Home Health | Payer: Self-pay | Source: Home / Self Care | Attending: Orthopedic Surgery

## 2022-01-04 ENCOUNTER — Encounter (HOSPITAL_COMMUNITY): Payer: Self-pay | Admitting: Orthopedic Surgery

## 2022-01-04 DIAGNOSIS — Z79891 Long term (current) use of opiate analgesic: Secondary | ICD-10-CM

## 2022-01-04 DIAGNOSIS — E669 Obesity, unspecified: Secondary | ICD-10-CM | POA: Diagnosis present

## 2022-01-04 DIAGNOSIS — M1711 Unilateral primary osteoarthritis, right knee: Secondary | ICD-10-CM | POA: Diagnosis not present

## 2022-01-04 DIAGNOSIS — Z801 Family history of malignant neoplasm of trachea, bronchus and lung: Secondary | ICD-10-CM

## 2022-01-04 DIAGNOSIS — H3530 Unspecified macular degeneration: Secondary | ICD-10-CM | POA: Diagnosis present

## 2022-01-04 DIAGNOSIS — Z9049 Acquired absence of other specified parts of digestive tract: Secondary | ICD-10-CM

## 2022-01-04 DIAGNOSIS — Z79899 Other long term (current) drug therapy: Secondary | ICD-10-CM

## 2022-01-04 DIAGNOSIS — Z8042 Family history of malignant neoplasm of prostate: Secondary | ICD-10-CM

## 2022-01-04 DIAGNOSIS — Z6837 Body mass index (BMI) 37.0-37.9, adult: Secondary | ICD-10-CM

## 2022-01-04 DIAGNOSIS — Z8261 Family history of arthritis: Secondary | ICD-10-CM

## 2022-01-04 DIAGNOSIS — D62 Acute posthemorrhagic anemia: Secondary | ICD-10-CM | POA: Diagnosis not present

## 2022-01-04 DIAGNOSIS — Z96651 Presence of right artificial knee joint: Secondary | ICD-10-CM

## 2022-01-04 DIAGNOSIS — F32A Depression, unspecified: Secondary | ICD-10-CM | POA: Diagnosis present

## 2022-01-04 DIAGNOSIS — Z791 Long term (current) use of non-steroidal anti-inflammatories (NSAID): Secondary | ICD-10-CM

## 2022-01-04 DIAGNOSIS — Z8249 Family history of ischemic heart disease and other diseases of the circulatory system: Secondary | ICD-10-CM

## 2022-01-04 DIAGNOSIS — E785 Hyperlipidemia, unspecified: Secondary | ICD-10-CM | POA: Diagnosis present

## 2022-01-04 DIAGNOSIS — G4700 Insomnia, unspecified: Secondary | ICD-10-CM | POA: Diagnosis present

## 2022-01-04 DIAGNOSIS — Z7982 Long term (current) use of aspirin: Secondary | ICD-10-CM

## 2022-01-04 DIAGNOSIS — Z888 Allergy status to other drugs, medicaments and biological substances status: Secondary | ICD-10-CM

## 2022-01-04 DIAGNOSIS — Z79890 Hormone replacement therapy (postmenopausal): Secondary | ICD-10-CM

## 2022-01-04 DIAGNOSIS — Z96652 Presence of left artificial knee joint: Secondary | ICD-10-CM | POA: Diagnosis present

## 2022-01-04 DIAGNOSIS — I10 Essential (primary) hypertension: Secondary | ICD-10-CM | POA: Diagnosis present

## 2022-01-04 DIAGNOSIS — E039 Hypothyroidism, unspecified: Secondary | ICD-10-CM | POA: Diagnosis present

## 2022-01-04 DIAGNOSIS — I4510 Unspecified right bundle-branch block: Secondary | ICD-10-CM | POA: Diagnosis present

## 2022-01-04 DIAGNOSIS — G8929 Other chronic pain: Secondary | ICD-10-CM | POA: Diagnosis present

## 2022-01-04 HISTORY — PX: TOTAL KNEE ARTHROPLASTY: SHX125

## 2022-01-04 SURGERY — ARTHROPLASTY, KNEE, TOTAL
Anesthesia: Spinal | Site: Knee | Laterality: Right

## 2022-01-04 MED ORDER — ONDANSETRON HCL 4 MG/2ML IJ SOLN: 4.0000 mg | Freq: Four times a day (QID) | INTRAMUSCULAR | Status: DC | PRN

## 2022-01-04 MED ORDER — SODIUM CHLORIDE (PF) 0.9 % IJ SOLN
INTRAMUSCULAR | Status: DC | PRN
  Administered 2022-01-04: 50 mL

## 2022-01-04 MED ORDER — DIPHENHYDRAMINE HCL 12.5 MG/5ML PO ELIX: 12.5000 mg | ORAL_SOLUTION | ORAL | Status: DC | PRN

## 2022-01-04 MED ORDER — PHENYLEPHRINE HCL-NACL 20-0.9 MG/250ML-% IV SOLN
INTRAVENOUS | Status: DC | PRN
  Administered 2022-01-04: 35 ug/min via INTRAVENOUS

## 2022-01-04 MED ORDER — ZOLPIDEM TARTRATE 5 MG PO TABS: 5.0000 mg | ORAL_TABLET | Freq: Every evening | ORAL | Status: DC | PRN

## 2022-01-04 MED ORDER — LACTATED RINGERS IV SOLN
INTRAVENOUS | Status: DC
  Administered 2022-01-04: 1000 mL via INTRAVENOUS

## 2022-01-04 MED ORDER — FENTANYL CITRATE (PF) 100 MCG/2ML IJ SOLN
INTRAMUSCULAR | Status: DC | PRN
Start: 2022-01-04 — End: 2022-01-04
  Administered 2022-01-04: 50 ug via INTRAVENOUS

## 2022-01-04 MED ORDER — AMLODIPINE BESYLATE 5 MG PO TABS
5.0000 mg | ORAL_TABLET | Freq: Every day | ORAL | Status: DC
  Administered 2022-01-05 – 2022-01-06 (×2): 5 mg via ORAL
  Filled 2022-01-04 (×2): qty 1

## 2022-01-04 MED ORDER — GABAPENTIN 100 MG PO CAPS
100.0000 mg | ORAL_CAPSULE | Freq: Three times a day (TID) | ORAL | Status: DC
  Administered 2022-01-04 – 2022-01-06 (×7): 100 mg via ORAL
  Filled 2022-01-04 (×7): qty 1

## 2022-01-04 MED ORDER — FENTANYL CITRATE PF 50 MCG/ML IJ SOSY
PREFILLED_SYRINGE | INTRAMUSCULAR | Status: AC
  Filled 2022-01-04: qty 3

## 2022-01-04 MED ORDER — KCL IN DEXTROSE-NACL 20-5-0.45 MEQ/L-%-% IV SOLN
INTRAVENOUS | Status: DC
  Filled 2022-01-04 (×3): qty 1000

## 2022-01-04 MED ORDER — BUPIVACAINE-EPINEPHRINE (PF) 0.25% -1:200000 IJ SOLN
INTRAMUSCULAR | Status: DC | PRN
  Administered 2022-01-04: 30 mL

## 2022-01-04 MED ORDER — KETOROLAC TROMETHAMINE 30 MG/ML IJ SOLN
INTRAMUSCULAR | Status: AC
  Filled 2022-01-04: qty 1

## 2022-01-04 MED ORDER — ONDANSETRON HCL 4 MG/2ML IJ SOLN
INTRAMUSCULAR | Status: AC
  Filled 2022-01-04: qty 2

## 2022-01-04 MED ORDER — ACETAMINOPHEN 325 MG PO TABS
325.0000 mg | ORAL_TABLET | Freq: Four times a day (QID) | ORAL | Status: DC | PRN
  Administered 2022-01-05 – 2022-01-06 (×4): 650 mg via ORAL
  Filled 2022-01-04 (×4): qty 2

## 2022-01-04 MED ORDER — MENTHOL 3 MG MT LOZG: 1.0000 | LOZENGE | OROMUCOSAL | Status: DC | PRN

## 2022-01-04 MED ORDER — POLYETHYLENE GLYCOL 3350 17 G PO PACK
17.0000 g | PACK | Freq: Every day | ORAL | Status: DC | PRN
  Administered 2022-01-05: 17 g via ORAL
  Filled 2022-01-04: qty 1

## 2022-01-04 MED ORDER — WATER FOR IRRIGATION, STERILE IR SOLN
Status: DC | PRN
  Administered 2022-01-04: 2000 mL

## 2022-01-04 MED ORDER — ALUM & MAG HYDROXIDE-SIMETH 200-200-20 MG/5ML PO SUSP
30.0000 mL | ORAL | Status: DC | PRN
  Administered 2022-01-05: 30 mL via ORAL
  Filled 2022-01-04: qty 30

## 2022-01-04 MED ORDER — ONDANSETRON HCL 4 MG/2ML IJ SOLN
INTRAMUSCULAR | Status: DC | PRN
  Administered 2022-01-04: 4 mg via INTRAVENOUS

## 2022-01-04 MED ORDER — METOCLOPRAMIDE HCL 5 MG/ML IJ SOLN: 5.0000 mg | Freq: Three times a day (TID) | INTRAMUSCULAR | Status: DC | PRN

## 2022-01-04 MED ORDER — NYSTATIN 100000 UNIT/GM EX POWD
1.0000 "application " | Freq: Every day | CUTANEOUS | Status: DC
  Administered 2022-01-04 – 2022-01-05 (×2): 1 via TOPICAL
  Filled 2022-01-04: qty 15

## 2022-01-04 MED ORDER — TIZANIDINE HCL 2 MG PO CAPS: 4.0000 mg | ORAL_CAPSULE | Freq: Three times a day (TID) | ORAL | 0 refills | Status: DC | PRN

## 2022-01-04 MED ORDER — CITALOPRAM HYDROBROMIDE 20 MG PO TABS
40.0000 mg | ORAL_TABLET | Freq: Every day | ORAL | Status: DC
  Administered 2022-01-05 – 2022-01-06 (×2): 40 mg via ORAL
  Filled 2022-01-04 (×2): qty 2

## 2022-01-04 MED ORDER — ASPIRIN 81 MG PO CHEW
81.0000 mg | CHEWABLE_TABLET | Freq: Two times a day (BID) | ORAL | Status: DC
  Administered 2022-01-04 – 2022-01-06 (×4): 81 mg via ORAL
  Filled 2022-01-04 (×4): qty 1

## 2022-01-04 MED ORDER — PROPOFOL 500 MG/50ML IV EMUL
INTRAVENOUS | Status: DC | PRN
  Administered 2022-01-04: 75 ug/kg/min via INTRAVENOUS
  Administered 2022-01-04 (×2): 20 mg via INTRAVENOUS

## 2022-01-04 MED ORDER — CHLORHEXIDINE GLUCONATE 0.12 % MT SOLN
15.0000 mL | Freq: Once | OROMUCOSAL | Status: AC
  Administered 2022-01-04: 15 mL via OROMUCOSAL

## 2022-01-04 MED ORDER — DOCUSATE SODIUM 100 MG PO CAPS
100.0000 mg | ORAL_CAPSULE | Freq: Two times a day (BID) | ORAL | Status: DC
  Administered 2022-01-04 – 2022-01-06 (×4): 100 mg via ORAL
  Filled 2022-01-04 (×4): qty 1

## 2022-01-04 MED ORDER — ACETAMINOPHEN 500 MG PO TABS
1000.0000 mg | ORAL_TABLET | Freq: Once | ORAL | Status: AC
  Administered 2022-01-04: 1000 mg via ORAL
  Filled 2022-01-04: qty 2

## 2022-01-04 MED ORDER — NYSTATIN 100000 UNIT/GM EX CREA
1.0000 "application " | TOPICAL_CREAM | Freq: Every evening | CUTANEOUS | Status: DC
  Administered 2022-01-05: 1 via TOPICAL
  Filled 2022-01-04: qty 30

## 2022-01-04 MED ORDER — PROPOFOL 1000 MG/100ML IV EMUL
INTRAVENOUS | Status: AC
  Filled 2022-01-04: qty 100

## 2022-01-04 MED ORDER — LACTATED RINGERS IV BOLUS
250.0000 mL | Freq: Once | INTRAVENOUS | Status: AC
  Administered 2022-01-04: 250 mL via INTRAVENOUS

## 2022-01-04 MED ORDER — LACTATED RINGERS IV SOLN: INTRAVENOUS | Status: DC

## 2022-01-04 MED ORDER — OXYCODONE HCL 5 MG PO TABS
ORAL_TABLET | ORAL | Status: AC
  Filled 2022-01-04: qty 2

## 2022-01-04 MED ORDER — PROPOFOL 10 MG/ML IV BOLUS
INTRAVENOUS | Status: AC
  Filled 2022-01-04: qty 20

## 2022-01-04 MED ORDER — PHENOL 1.4 % MT LIQD: 1.0000 | OROMUCOSAL | Status: DC | PRN

## 2022-01-04 MED ORDER — ONDANSETRON HCL 4 MG PO TABS: 4.0000 mg | ORAL_TABLET | Freq: Four times a day (QID) | ORAL | Status: DC | PRN

## 2022-01-04 MED ORDER — BUPIVACAINE LIPOSOME 1.3 % IJ SUSP: 20.0000 mL | Freq: Once | INTRAMUSCULAR | Status: DC

## 2022-01-04 MED ORDER — LACTATED RINGERS IV BOLUS
500.0000 mL | Freq: Once | INTRAVENOUS | Status: AC
  Administered 2022-01-04: 500 mL via INTRAVENOUS

## 2022-01-04 MED ORDER — TRANEXAMIC ACID-NACL 1000-0.7 MG/100ML-% IV SOLN
1000.0000 mg | INTRAVENOUS | Status: AC
  Administered 2022-01-04: 1000 mg via INTRAVENOUS
  Filled 2022-01-04: qty 100

## 2022-01-04 MED ORDER — AMISULPRIDE (ANTIEMETIC) 5 MG/2ML IV SOLN: 10.0000 mg | Freq: Once | INTRAVENOUS | Status: DC | PRN

## 2022-01-04 MED ORDER — PANTOPRAZOLE SODIUM 40 MG PO TBEC
40.0000 mg | DELAYED_RELEASE_TABLET | Freq: Every day | ORAL | Status: DC
  Administered 2022-01-04 – 2022-01-06 (×3): 40 mg via ORAL
  Filled 2022-01-04 (×3): qty 1

## 2022-01-04 MED ORDER — OXYCODONE HCL 5 MG PO TABS: 5.0000 mg | ORAL_TABLET | ORAL | 0 refills | Status: DC | PRN

## 2022-01-04 MED ORDER — ROPIVACAINE HCL 5 MG/ML IJ SOLN
INTRAMUSCULAR | Status: DC | PRN
  Administered 2022-01-04: 20 mL via PERINEURAL

## 2022-01-04 MED ORDER — LEVOTHYROXINE SODIUM 25 MCG PO TABS
25.0000 ug | ORAL_TABLET | Freq: Every day | ORAL | Status: DC
  Administered 2022-01-05 – 2022-01-06 (×2): 25 ug via ORAL
  Filled 2022-01-04 (×2): qty 1

## 2022-01-04 MED ORDER — FENTANYL CITRATE PF 50 MCG/ML IJ SOSY
25.0000 ug | PREFILLED_SYRINGE | INTRAMUSCULAR | Status: DC | PRN
  Administered 2022-01-04 (×3): 50 ug via INTRAVENOUS

## 2022-01-04 MED ORDER — MELATONIN 5 MG PO TABS
10.0000 mg | ORAL_TABLET | Freq: Every day | ORAL | Status: DC
Start: 2022-01-04 — End: 2022-01-06
  Administered 2022-01-04 – 2022-01-05 (×2): 10 mg via ORAL
  Filled 2022-01-04 (×2): qty 2

## 2022-01-04 MED ORDER — BUPIVACAINE IN DEXTROSE 0.75-8.25 % IT SOLN
INTRATHECAL | Status: DC | PRN
  Administered 2022-01-04: 1.6 mL via INTRATHECAL

## 2022-01-04 MED ORDER — BUPIVACAINE-EPINEPHRINE (PF) 0.25% -1:200000 IJ SOLN
INTRAMUSCULAR | Status: AC
  Filled 2022-01-04: qty 30

## 2022-01-04 MED ORDER — HYDROMORPHONE HCL 1 MG/ML IJ SOLN
INTRAMUSCULAR | Status: AC
  Filled 2022-01-04: qty 1

## 2022-01-04 MED ORDER — TRANEXAMIC ACID-NACL 1000-0.7 MG/100ML-% IV SOLN
1000.0000 mg | Freq: Once | INTRAVENOUS | Status: AC
  Administered 2022-01-04: 1000 mg via INTRAVENOUS

## 2022-01-04 MED ORDER — MIRABEGRON ER 25 MG PO TB24
50.0000 mg | ORAL_TABLET | Freq: Every day | ORAL | Status: DC
  Administered 2022-01-05 – 2022-01-06 (×2): 50 mg via ORAL
  Filled 2022-01-04 (×2): qty 2

## 2022-01-04 MED ORDER — KETOROLAC TROMETHAMINE 15 MG/ML IJ SOLN
INTRAMUSCULAR | Status: DC | PRN
  Administered 2022-01-04: 15 mg via INTRAVENOUS

## 2022-01-04 MED ORDER — OXYCODONE HCL 5 MG PO TABS
5.0000 mg | ORAL_TABLET | ORAL | Status: DC | PRN
  Administered 2022-01-04 – 2022-01-06 (×8): 10 mg via ORAL
  Filled 2022-01-04 (×7): qty 2

## 2022-01-04 MED ORDER — SODIUM CHLORIDE (PF) 0.9 % IJ SOLN
INTRAMUSCULAR | Status: AC
  Filled 2022-01-04: qty 50

## 2022-01-04 MED ORDER — FLEET ENEMA 7-19 GM/118ML RE ENEM: 1.0000 | ENEMA | Freq: Once | RECTAL | Status: DC | PRN

## 2022-01-04 MED ORDER — ORAL CARE MOUTH RINSE: 15.0000 mL | Freq: Once | OROMUCOSAL | Status: AC

## 2022-01-04 MED ORDER — FENTANYL CITRATE (PF) 100 MCG/2ML IJ SOLN
INTRAMUSCULAR | Status: AC
  Filled 2022-01-04: qty 2

## 2022-01-04 MED ORDER — POVIDONE-IODINE 10 % EX SWAB
2.0000 "application " | Freq: Once | CUTANEOUS | Status: AC
  Administered 2022-01-04: 2 via TOPICAL

## 2022-01-04 MED ORDER — SODIUM CHLORIDE 0.9 % IR SOLN
Status: DC | PRN
Start: 2022-01-04 — End: 2022-01-04
  Administered 2022-01-04 (×2): 1000 mL

## 2022-01-04 MED ORDER — CEFAZOLIN SODIUM-DEXTROSE 2-4 GM/100ML-% IV SOLN
2.0000 g | INTRAVENOUS | Status: AC
  Administered 2022-01-04: 2 g via INTRAVENOUS
  Filled 2022-01-04: qty 100

## 2022-01-04 MED ORDER — HYDROMORPHONE HCL 1 MG/ML IJ SOLN
0.5000 mg | INTRAMUSCULAR | Status: DC | PRN
  Administered 2022-01-04 – 2022-01-05 (×5): 1 mg via INTRAVENOUS
  Filled 2022-01-04 (×4): qty 1

## 2022-01-04 MED ORDER — TRANEXAMIC ACID-NACL 1000-0.7 MG/100ML-% IV SOLN
INTRAVENOUS | Status: AC
  Filled 2022-01-04: qty 100

## 2022-01-04 MED ORDER — ATORVASTATIN CALCIUM 20 MG PO TABS
20.0000 mg | ORAL_TABLET | Freq: Every day | ORAL | Status: DC
Start: 2022-01-04 — End: 2022-01-06
  Administered 2022-01-04 – 2022-01-06 (×3): 20 mg via ORAL
  Filled 2022-01-04 (×3): qty 1

## 2022-01-04 MED ORDER — BISACODYL 5 MG PO TBEC: 5.0000 mg | DELAYED_RELEASE_TABLET | Freq: Every day | ORAL | Status: DC | PRN

## 2022-01-04 MED ORDER — TRANEXAMIC ACID 1000 MG/10ML IV SOLN
2000.0000 mg | INTRAVENOUS | Status: DC
  Filled 2022-01-04: qty 20

## 2022-01-04 MED ORDER — BUPIVACAINE LIPOSOME 1.3 % IJ SUSP
INTRAMUSCULAR | Status: AC
  Filled 2022-01-04: qty 20

## 2022-01-04 MED ORDER — METOCLOPRAMIDE HCL 5 MG PO TABS: 5.0000 mg | ORAL_TABLET | Freq: Three times a day (TID) | ORAL | Status: DC | PRN

## 2022-01-04 SURGICAL SUPPLY — 52 items
ATTUNE PS FEM RT SZ 5 CEM KNEE (Femur) ×1 IMPLANT
ATTUNE PSRP INSR SZ5 5 KNEE (Insert) ×1 IMPLANT
BAG COUNTER SPONGE SURGICOUNT (BAG) ×1 IMPLANT
BAG DECANTER FOR FLEXI CONT (MISCELLANEOUS) ×2 IMPLANT
BAG SPEC THK2 15X12 ZIP CLS (MISCELLANEOUS) ×1
BAG SPNG CNTER NS LX DISP (BAG) ×1
BAG ZIPLOCK 12X15 (MISCELLANEOUS) ×2 IMPLANT
BASE TIBIAL ROT PLAT SZ 5 KNEE (Knees) IMPLANT
BLADE SAG 18X100X1.27 (BLADE) ×2 IMPLANT
BLADE SAW SGTL 11.0X1.19X90.0M (BLADE) ×2 IMPLANT
BLADE SURG SZ10 CARB STEEL (BLADE) ×4 IMPLANT
BNDG CMPR MED 10X6 ELC LF (GAUZE/BANDAGES/DRESSINGS) ×1
BNDG ELASTIC 6X10 VLCR STRL LF (GAUZE/BANDAGES/DRESSINGS) ×2 IMPLANT
BOWL SMART MIX CTS (DISPOSABLE) ×2 IMPLANT
BSPLAT TIB 5 CMNT ROT PLAT STR (Knees) ×1 IMPLANT
CEMENT HV SMART SET (Cement) ×4 IMPLANT
COVER SURGICAL LIGHT HANDLE (MISCELLANEOUS) ×2 IMPLANT
CUFF TOURN SGL QUICK 34 (TOURNIQUET CUFF) ×2
CUFF TRNQT CYL 34X4.125X (TOURNIQUET CUFF) ×1 IMPLANT
DRAPE INCISE IOBAN 66X45 STRL (DRAPES) ×2 IMPLANT
DRAPE U-SHAPE 47X51 STRL (DRAPES) ×2 IMPLANT
DRSG AQUACEL AG ADV 3.5X10 (GAUZE/BANDAGES/DRESSINGS) ×2 IMPLANT
DURAPREP 26ML APPLICATOR (WOUND CARE) ×2 IMPLANT
ELECT REM PT RETURN 15FT ADLT (MISCELLANEOUS) ×2 IMPLANT
GLOVE SRG 8 PF TXTR STRL LF DI (GLOVE) ×1 IMPLANT
GLOVE SURG ENC MOIS LTX SZ7.5 (GLOVE) ×2 IMPLANT
GLOVE SURG ENC MOIS LTX SZ8.5 (GLOVE) ×2 IMPLANT
GLOVE SURG UNDER POLY LF SZ8 (GLOVE) ×2
GLOVE SURG UNDER POLY LF SZ9 (GLOVE) ×2 IMPLANT
GOWN STRL REUS W/ TWL XL LVL3 (GOWN DISPOSABLE) ×2 IMPLANT
GOWN STRL REUS W/TWL XL LVL3 (GOWN DISPOSABLE) ×4
HANDPIECE INTERPULSE COAX TIP (DISPOSABLE) ×2
HOOD PEEL AWAY FLYTE STAYCOOL (MISCELLANEOUS) ×6 IMPLANT
KIT TURNOVER KIT A (KITS) IMPLANT
NDL HYPO 21X1.5 SAFETY (NEEDLE) ×2 IMPLANT
NEEDLE HYPO 21X1.5 SAFETY (NEEDLE) ×4 IMPLANT
NS IRRIG 1000ML POUR BTL (IV SOLUTION) ×2 IMPLANT
PACK TOTAL KNEE CUSTOM (KITS) ×2 IMPLANT
PATELLA MEDIAL ATTUN 35MM KNEE (Knees) ×1 IMPLANT
PROTECTOR NERVE ULNAR (MISCELLANEOUS) ×2 IMPLANT
SET HNDPC FAN SPRY TIP SCT (DISPOSABLE) ×1 IMPLANT
SPIKE FLUID TRANSFER (MISCELLANEOUS) ×6 IMPLANT
SUT VIC AB 1 CTX 36 (SUTURE) ×2
SUT VIC AB 1 CTX36XBRD ANBCTR (SUTURE) ×1 IMPLANT
SUT VIC AB 3-0 CT1 27 (SUTURE) ×6
SUT VIC AB 3-0 CT1 TAPERPNT 27 (SUTURE) ×3 IMPLANT
SYR CONTROL 10ML LL (SYRINGE) ×4 IMPLANT
TIBIAL BASE ROT PLAT SZ 5 KNEE (Knees) ×2 IMPLANT
TRAY CATH INTERMITTENT SS 16FR (CATHETERS) ×1 IMPLANT
TRAY FOLEY MTR SLVR 16FR STAT (SET/KITS/TRAYS/PACK) ×1 IMPLANT
WATER STERILE IRR 1000ML POUR (IV SOLUTION) ×4 IMPLANT
WRAP KNEE MAXI GEL POST OP (GAUZE/BANDAGES/DRESSINGS) ×2 IMPLANT

## 2022-01-04 NOTE — Op Note (Signed)
PATIENT ID:      Allison Parker  ?MRN:     [id number] ?DOB/AGE:    79-07-1941 / 79 y.o. ? ? ?    OPERATIVE REPORT  ? ?DATE OF PROCEDURE:  01/04/2022   ?   ?PREOPERATIVE DIAGNOSIS:   RIGHT KNEE OSTEOARTHRITIS  ?    Estimated body mass index is 37.61 kg/m? as calculated from the following: ?  Height as of 12/23/21: '5\' 5"'$  (1.651 m). ?  Weight as of this encounter: 102.5 kg. ?                                                      ?POSTOPERATIVE DIAGNOSIS:   Same                                                                ?  ?PROCEDURE:  Procedure(s): ?RIGHT TOTAL KNEE ARTHROPLASTY Using DepuyAttune RP implants #5R Femur, #5Tibia, 5 mm Attune RP bearing, 35 Patella ?   ?SURGEON: Kerin Salen  ?ASSISTANT:   Kerry Hough. Barton Dubois   (Present and scrubbed throughout the case, critical for assistance with exposure, retraction, instrumentation, and closure.)      ?  ?ANESTHESIA: spinal, 20cc Exparel, 50cc 0.25% Marcaine ?EBL: 300 cc ?FLUID REPLACEMENT: 1600 cc crystaloid ?TOURNIQUET: ?DRAINS: None ?TRANEXAMIC ACID: 1gm IV, 2gm topical ?COMPLICATIONS:  None      ?   ?INDICATIONS FOR PROCEDURE: The patient has ? RIGHT KNEE OSTEOARTHRITIS, Val deformities, XR shows bone on bone arthritis, lateral subluxation of tibia. Patient has failed all conservative measures including anti-inflammatory medicines, narcotics, attempts at exercise and weight loss, cortisone injections and viscosupplementation.  Risks and benefits of surgery have been discussed, questions answered.  ? ?DESCRIPTION OF PROCEDURE: The patient identified by armband, received  IV antibiotics, in the holding area at [facility]. Patient taken to the operating room, appropriate anesthetic monitors were attached, and spinal anesthesia was  induced. IV Tranexamic acid was given.Tourniquet applied high to the operative thigh. Lateral post and foot positioner applied to the table, the lower extremity was then prepped and draped in usual sterile fashion from the toes  to the tourniquet. Time-out procedure was performed. Kerry Hough. [facility] PAC, was present and scrubbed throughout the case, critical for assistance with, positioning, exposure, retraction, instrumentation, and closure.The skin and subcutaneous tissue along the incision was injected with 20 cc of a mixture of Exparel and Marcaine solution, using a 20-gauge by 1-1/2 inch needle. We began the operation, with the knee flexed 130 degrees, by making the anterior midline incision starting at handbreadth above the patella going over the patella 1 cm medial to and 4 cm distal to the tibial tubercle. Small bleeders in the skin and the subcutaneous tissue identified and cauterized. Transverse retinaculum was incised and reflected medially and a medial parapatellar arthrotomy was accomplished. the patella was everted and theprepatellar fat pad resected. The superficial medial collateral ligament was then elevated from anterior to posterior along the proximal flare of the tibia and anterior half of the menisci resected. The knee was hyperflexed exposing bone on bone arthritis. Peripheral and  notch osteophytes as well as the cruciate ligaments were then resected. We continued to work our way around posteriorly along the proximal tibia, and externally rotated the tibia subluxing it out from underneath the femur. A McHale PCL retractor was placed through the notch and a lateral Hohmann retractor placed, and we then entered the proximal tibia in line with the Depuy starter drill in line with the axis of the tibia followed by an intramedullary guide rod and 0-degree posterior slope cutting guide. The tibial cutting guide, 4 degree posterior sloped, was pinned into place allowing resection of 6 mm of bone medially and 2 mm of bone laterally. Satisfied with the tibial resection, we then entered the distal femur 2 mm anterior to the PCL origin with the intramedullary guide rod and applied the distal femoral cutting guide set at 9 mm, with  5 degrees of valgus. This was pinned along the epicondylar axis. At this point, the distal femoral cut was accomplished without difficulty. We then sized for a #5R femoral component and pinned the guide in 0 degrees of external rotation. The chamfer cutting guide was pinned into place. The anterior, posterior, and chamfer cuts were accomplished without difficulty followed by the Attune RP box cutting guide and the box cut. We also removed posterior osteophytes from the posterior femoral condyles. The posterior capsule was injected with Exparel solution. The knee was brought into full extension. We checked our extension gap and fit a 5 mm bearing. Distracting in extension with a lamina spreader,  bleeders in the posterior capsule, Posterior medial and posterior lateral gutter were cauterized.  The transexamic acid-soaked sponge was then placed in the gap of the knee in extension. The knee was flexed 30?Marland Kitchen The posterior patella cut was accomplished with the 9.5 mm Attune cutting guide, sized for a 39m dome, and the fixation pegs drilled.The knee was then once again hyperflexed exposing the proximal tibia. We sized for a # 5 tibial base plate, applied the smokestack and the conical reamer followed by the the Delta fin keel punch. We then hammered into place the Attune RP trial femoral component, drilled the lugs, inserted a  5 mm trial bearing, trial patellar button, and took the knee through range of motion from 0-130 degrees. Medial and lateral ligamentous stability was checked. No thumb pressure was required for patellar Tracking. The tourniquet was not used. All trial components were removed, mating surfaces irrigated with pulse lavage, and dried with suction and sponges. 10 cc of the Exparel solution was applied to the cancellus bone of the patella distal femur and proximal tibia.  After waiting 30 seconds, the bony surfaces were again, dried with sponges. A double batch of DePuy HV cement was mixed and applied to  all bony metallic mating surfaces except for the posterior condyles of the femur itself. In order, we hammered into place the tibial tray and removed excess cement, the femoral component and removed excess cement. The final Attune RP bearing was inserted, and the knee brought to full extension with compression. The patellar button was clamped into place, and excess cement removed. The knee was held at 30? flexion with compression, while the cement cured. The wound was irrigated out with normal saline solution pulse lavage. The rest of the Exparel was injected into the parapatellar arthrotomy, subcutaneous tissues, and periosteal tissues. The parapatellar arthrotomy was closed with running #1 Vicryl suture. The subcutaneous tissue with 3-0 undyed Vicryl suture, and the skin with running 3-0 SQ vicryl. An Aquacil and Ace  wrap were applied. The patient was taken to recovery room without difficulty.  ? ?Kerin Salen ?01/04/2022, 7:01 AM ?  ?

## 2022-01-04 NOTE — Evaluation (Signed)
Physical Therapy Evaluation ?Patient Details ?Name: Allison Parker ?MRN: [id number] ?DOB: August 08, 1941 ?Today's Date: 01/04/2022 ? ?History of Present Illness ? 81 yo female s/p R TKA. PMH: HTN, L TKA, RBBB, opioid dependence, thalamic pain syndrome, CVA with residual R sided weakness  ?Clinical Impression ? Pt is s/p TKA resulting in the deficits listed below (see PT Problem List).   Pt amb  ~ 48' x2 with min assist and multiple standing rest breaks d/t R UE numbness and fatigue. Pt with slow, unsteady gait placing her at risk for falls. Pt reports her dtr will be with her tomorrow and feel this will be a safer d/c plan as her husband is not able to provide physical assist. Pt has had pain issues post op as well, and given baseline opioid use pt is concerned about pain control. ?Continue to follow.  ? ? ? Pt will benefit from skilled PT to increase their independence and safety with mobility to allow discharge to the venue listed below.  ?   ?   ? ?Recommendations for follow up therapy are one component of a multi-disciplinary discharge planning process, led by the attending physician.  Recommendations may be updated based on patient status, additional functional criteria and insurance authorization. ? ?Follow Up Recommendations Follow physician's recommendations for discharge plan and follow up therapies ? ?  ?Assistance Recommended at Discharge    ?Patient can return home with the following ? A little help with walking and/or transfers;Assistance with cooking/housework;Help with stairs or ramp for entrance;Assist for transportation;A little help with bathing/dressing/bathroom ? ?  ?Equipment Recommendations None recommended by PT  ?Recommendations for Other Services ?    ?  ?Functional Status Assessment Patient has had a recent decline in their functional status and demonstrates the ability to make significant improvements in function in a reasonable and predictable amount of time.  ? ?  ?Precautions / Restrictions  Precautions ?Precautions: Knee ?Restrictions ?Weight Bearing Restrictions: No ?Other Position/Activity Restrictions: WBAT  ? ?  ? ?Mobility ? Bed Mobility ?Overal bed mobility: Needs Assistance ?Bed Mobility: Supine to Sit, Sit to Supine ?  ?  ?Supine to sit: Min assist ?Sit to supine: Min assist ?  ?General bed mobility comments: assist with LEs ?  ? ?Transfers ?Overall transfer level: Needs assistance ?Equipment used: Rolling walker (2 wheels) ?Transfers: Sit to/from Stand ?Sit to Stand: Mod assist ?  ?  ?  ?  ?  ?General transfer comment: cues for hand placement, assist to rise and stabilize, to control excessive anterior wt shift and maintain midline ?  ? ?Ambulation/Gait ?Ambulation/Gait assistance: Min assist ?Gait Distance (Feet): 50 Feet (x2) ?Assistive device: Rolling walker (2 wheels) ?Gait Pattern/deviations: Step-to pattern ?  ?  ?  ?General Gait Details: cues for sequence, RW position. unsteady gait however without overt LOB ? ?Stairs ?  ?  ?  ?  ?  ? ?Wheelchair Mobility ?  ? ?Modified Rankin (Stroke Patients Only) ?  ? ?  ? ?Balance Overall balance assessment: History of Falls (pt reports frequent falls, at least every 2-3 mos) ?Sitting-balance support: Feet unsupported, Single extremity supported ?Sitting balance-Leahy Scale: Fair ?Sitting balance - Comments: on stretcher ?  ?Standing balance support: Reliant on assistive device for balance, During functional activity ?Standing balance-Leahy Scale: Fair ?Standing balance comment: reliant on unilateral UE support to wash hands, wide BOS compensation ?  ?  ?  ?  ?  ?  ?  ?  ?  ?  ?  ?   ? ? ? ?  Pertinent Vitals/Pain Pain Assessment ?Pain Assessment: 0-10 ?Pain Score: 4  ?Pain Location: right knee ?Pain Descriptors / Indicators: Aching, Discomfort, Sore ?Pain Intervention(s): Limited activity within patient's tolerance, Monitored during session, Premedicated before session  ? ? ?Home Living Family/patient expects to be discharged to:: Private  residence ?Living Arrangements: Spouse/significant other ?Available Help at Discharge: Family ?Type of Home: House ?Home Access: Stairs to enter ?Entrance Stairs-Rails: Right;Left;Can reach both ?Entrance Stairs-Number of Steps: 2 ?  ?Home Layout: One level ?Home Equipment: Conservation officer, nature (2 wheels) ?   ?  ?Prior Function Prior Level of Function : Independent/Modified Independent ?  ?  ?  ?  ?  ?  ?  ?  ?  ? ? ?Hand Dominance  ?   ? ?  ?Extremity/Trunk Assessment  ? Upper Extremity Assessment ?Upper Extremity Assessment: RUE deficits/detail ?RUE Deficits / Details: pt reported deficits d/t previous CVA. A/AROM grossly WFL. RUE numbness reported by pt--exacerbated by [facility] with use of RW ?  ? ?Lower Extremity Assessment ?Lower Extremity Assessment: RLE deficits/detail ?RLE Deficits / Details: ankle limited df, knee extension and hip flexion 2+/5, 5 degree quad lag ?RLE Sensation: decreased proprioception ?RLE Coordination: decreased gross motor ?  ? ?   ?Communication  ? Communication: No difficulties  ?Cognition Arousal/Alertness: Awake/alert ?Behavior During Therapy: [facility] for tasks assessed/performed ?Overall Cognitive Status: Within Functional Limits for tasks assessed ?  ?  ?  ?  ?  ?  ?  ?  ?  ?  ?  ?  ?  ?  ?  ?  ?  ?  ?  ? ?  ?General Comments   ? ?  ?Exercises    ? ?Assessment/Plan  ?  ?PT Assessment Patient needs continued PT services  ?PT Problem List Decreased strength;Decreased mobility;Decreased range of motion;Decreased activity tolerance;Decreased balance;Decreased coordination;Decreased knowledge of use of DME;Pain ? ?   ?  ?PT Treatment Interventions DME instruction;Therapeutic exercise;Gait training;Stair training;Functional mobility training;Therapeutic activities;Patient/family education;Balance training   ? ?PT Goals (Current goals can be found in the Care Plan section)  ?Acute Rehab PT Goals ?Patient Stated Goal: have less pain ?PT Goal Formulation: With patient ?Time For Goal Achievement:  01/11/22 ?Potential to Achieve Goals: Good ? ?  ?Frequency 7X/week ?  ? ? ?Co-evaluation   ?  ?  ?  ?  ? ? ?  ?AM-PAC PT "6 Clicks" Mobility  ?Outcome Measure Help needed turning from your back to your side while in a flat bed without using bedrails?: A Little ?Help needed moving from lying on your back to sitting on the side of a flat bed without using bedrails?: A Little ?Help needed moving to and from a bed to a chair (including a wheelchair)?: A Little ?Help needed standing up from a chair using your arms (e.g., wheelchair or bedside chair)?: A Little ?Help needed to walk in hospital room?: A Little ?Help needed climbing 3-5 steps with a railing? : A Lot ?6 Click Score: 17 ? ?  ?End of Session Equipment Utilized During Treatment: Gait belt ?Activity Tolerance: Patient tolerated treatment well ?Patient left: in chair;with call bell/phone within reach ?Nurse Communication: Mobility status ?PT Visit Diagnosis: Unsteadiness on feet (R26.81);Difficulty in walking, not elsewhere classified (R26.2);Muscle weakness (generalized) (M62.81);Other abnormalities of gait and mobility (R26.89) ?  ? ?Time: 9628-3662 ?PT Time Calculation (min) (ACUTE ONLY): 29 min ? ? ?Charges:   PT Evaluation ?$PT Eval Low Complexity: 1 Low ?PT Treatments ?$Gait Training: 8-22 mins ?  ?   ? ? ?  Baxter Flattery, PT ? ?Acute Rehab Dept [facility]) [phone] ?Pager [phone] ? ?01/04/2022 ? ? ?Kamdyn Covel ?01/04/2022, 1:42 PM ?

## 2022-01-04 NOTE — Interval H&P Note (Signed)
History and Physical Interval Note: ? ?01/04/2022 ?7:00 AM ? ?Allison Parker  has presented today for surgery, with the diagnosis of RIGHT KNEE OSTEOARTHRITIS.  The various methods of treatment have been discussed with the patient and family. After consideration of risks, benefits and other options for treatment, the patient has consented to  Procedure(s): ?RIGHT TOTAL KNEE ARTHROPLASTY (Right) as a surgical intervention.  The patient's history has been reviewed, patient examined, no change in status, stable for surgery.  I have reviewed the patient's chart and labs.  Questions were answered to the patient's satisfaction.   ? ? ?Kerin Salen ? ? ?

## 2022-01-04 NOTE — Plan of Care (Signed)

## 2022-01-04 NOTE — Anesthesia Procedure Notes (Signed)
Procedure Name: [place] ?Date/Time: 01/04/2022 7:27 AM ?Performed by: Maxwell Caul, CRNA ?Pre-anesthesia Checklist: Patient identified, Emergency Drugs available, Suction available and Patient being monitored ?Oxygen Delivery Method: Simple face mask ? ? ? ? ?

## 2022-01-04 NOTE — Progress Notes (Signed)
Orthopedic Tech Progress Note ?Patient Details:  ?Allison Parker ?[birth year] ?[id number] ? ?Ortho Devices ?Type of Ortho Device: Bone foam zero knee ?Ortho Device/Splint Interventions: Application ?  ?Post Interventions ?Patient Tolerated: Well ?Instructions Provided: Care of device ? ?[place] Pink ?01/04/2022, 9:50 AM ? ?

## 2022-01-04 NOTE — Anesthesia Procedure Notes (Signed)
Spinal ? ?Patient location during procedure: OR ?End time: 01/04/2022 7:36 AM ?Reason for block: surgical anesthesia ?Staffing ?Performed: resident/CRNA  ?Anesthesiologist: Suzette Battiest, MD ?Resident/CRNA: Maxwell Caul, CRNA ?Preanesthetic Checklist ?Completed: patient identified, IV checked, site marked, risks and benefits discussed, surgical consent, monitors and equipment checked, pre-op evaluation and timeout performed ?Spinal Block ?Patient position: sitting ?Prep: DuraPrep ?Patient monitoring: heart rate, cardiac monitor, continuous pulse ox and blood pressure ?Approach: midline ?Location: L3-4 ?Injection technique: single-shot ?Needle ?Needle type: Pencan  ?Needle gauge: 24 G ?Needle length: 10 cm ?Assessment ?Sensory level: T4 ?Events: CSF return ?Additional Notes ?IV functioning, monitors applied to pt. Expiration date of kit checked and confirmed to be in date. Sterile prep and drape, hand hygiene and sterile gloves used. Pt was positioned and spine was prepped in sterile fashion. Skin was anesthetized with lidocaine. Free flow of clear CSF obtained prior to injecting local anesthetic into CSF x 1 attempt. Spinal needle aspirated freely following injection. Needle was carefully withdrawn, and pt tolerated procedure well. Loss of motor and sensory on exam post injection. Dr Ola Spurr at bedside for entire placement. ? ? ? ?

## 2022-01-04 NOTE — Transfer of Care (Signed)
Immediate Anesthesia Transfer of Care Note ? ?Patient: Allison Parker ? ?Procedure(s) Performed: RIGHT TOTAL KNEE ARTHROPLASTY (Right: Knee) ? ?Patient Location: PACU ? ?Anesthesia Type:Spinal ? ?Level of Consciousness: awake, alert  and oriented ? ?Airway & Oxygen Therapy: Patient Spontanous Breathing and Patient connected to face mask oxygen ? ?Post-op Assessment: Report given to RN and Post -op Vital signs reviewed and stable ? ?Post vital signs: Reviewed and stable ? ?Last Vitals:  ?Vitals Value Taken Time  ?BP 130/65 01/04/22 1030  ?Temp 36.9 ?C 01/04/22 0945  ?Pulse 87 01/04/22 1031  ?Resp 15 01/04/22 1031  ?SpO2 100 % 01/04/22 1031  ?Vitals shown include unvalidated device data. ? ?Last Pain:  ?Vitals:  ? 01/04/22 1030  ?TempSrc:   ?PainSc: 4   ?   ? ?  ? ?Complications: No notable events documented. ?

## 2022-01-04 NOTE — Anesthesia Preprocedure Evaluation (Addendum)
Anesthesia Evaluation  ?Patient identified by MRN, date of birth, ID band ?Patient awake ? ? ? ?Reviewed: ?Allergy & Precautions, NPO status , Patient's Chart, lab work & pertinent test results ? ?History of Anesthesia Complications ?(+) PONV and history of anesthetic complications ? ?Airway ?Mallampati: II ? ?TM Distance: >3 FB ?Neck ROM: Full ? ? ? Dental ? ?(+) Dental Advisory Given ?  ?Pulmonary ?neg pulmonary ROS,  ?  ?breath sounds clear to auscultation ? ? ? ? ? ? Cardiovascular ?hypertension, Pt. on medications ? ?Rhythm:Regular Rate:Normal ? ? ?  ?Neuro/Psych ?CVA   ? GI/Hepatic ?negative GI ROS, Neg liver ROS,   ?Endo/Other  ?Hypothyroidism  ? Renal/GU ?negative Renal ROS  ? ?  ?Musculoskeletal ? ?(+) Arthritis ,  ? Abdominal ?  ?Peds ? Hematology ?negative hematology ROS ?(+)   ?Anesthesia Other Findings ? ? Reproductive/Obstetrics ? ?  ? ? ? ? ? ? ? ? ? ? ? ? ? ?  ?  ? ? ? ? ? ? ? ? ?Lab Results  ?Component Value Date  ? WBC 8.4 12/22/2021  ? HGB 14.1 12/22/2021  ? HCT 42.7 12/22/2021  ? MCV 91.0 12/22/2021  ? PLT 246 12/22/2021  ? ?Lab Results  ?Component Value Date  ? CREATININE 0.76 12/22/2021  ? BUN 20 12/22/2021  ? NA 134 (L) 12/22/2021  ? K 4.2 12/22/2021  ? CL 99 12/22/2021  ? CO2 28 12/22/2021  ? ? ?Anesthesia Physical ?Anesthesia Plan ? ?ASA: 2 ? ?Anesthesia Plan: Spinal  ? ?Post-op Pain Management: Regional block*, Tylenol PO (pre-op)* and Toradol IV (intra-op)*  ? ?Induction:  ? ?PONV Risk Score and Plan: 2 and Propofol infusion, Dexamethasone, Ondansetron and Treatment may vary due to age or medical condition ? ?Airway Management Planned: Natural Airway and Simple Face Mask ? ?Additional Equipment: None ? ?Intra-op Plan:  ? ?Post-operative Plan:  ? ?Informed Consent: I have reviewed the patients History and Physical, chart, labs and discussed the procedure including the risks, benefits and alternatives for the proposed anesthesia with the patient or  authorized representative who has indicated his/her understanding and acceptance.  ? ? ? ?Dental advisory given ? ?Plan Discussed with: CRNA ? ?Anesthesia Plan Comments:   ? ? ? ? ? ? ?Anesthesia Quick Evaluation ? ?

## 2022-01-04 NOTE — Anesthesia Postprocedure Evaluation (Signed)
Anesthesia Post Note ? ?Patient: Allison Parker ? ?Procedure(s) Performed: RIGHT TOTAL KNEE ARTHROPLASTY (Right: Knee) ? ?  ? ?Patient location during evaluation: PACU ?Anesthesia Type: Spinal ?Level of consciousness: awake and alert ?Pain management: pain level controlled ?Vital Signs Assessment: post-procedure vital signs reviewed and stable ?Respiratory status: spontaneous breathing and respiratory function stable ?Cardiovascular status: blood pressure returned to baseline and stable ?Postop Assessment: spinal receding ?Anesthetic complications: no ? ? ?No notable events documented. ? ?Last Vitals:  ?Vitals:  ? 01/04/22 1015 01/04/22 1030  ?BP: 131/82 130/65  ?Pulse: 91 89  ?Resp: 19 16  ?Temp:    ?SpO2: 97% 100%  ?  ?Last Pain:  ?Vitals:  ? 01/04/22 1030  ?TempSrc:   ?PainSc: 4   ? ? ?  ?  ?  ?  ?  ?  ? ?Suzette Battiest E ? ? ? ? ?

## 2022-01-04 NOTE — Discharge Instructions (Signed)
INSTRUCTIONS AFTER JOINT REPLACEMENT  ° °Remove items at home which could result in a fall. This includes throw rugs or furniture in walking pathways °ICE to the affected joint every three hours while awake for 30 minutes at a time, for at least the first 3-5 days, and then as needed for pain and swelling.  Continue to use ice for pain and swelling. You may notice swelling that will progress down to the foot and ankle.  This is normal after surgery.  Elevate your leg when you are not up walking on it.   °Continue to use the breathing machine you got in the hospital (incentive spirometer) which will help keep your temperature down.  It is common for your temperature to cycle up and down following surgery, especially at night when you are not up moving around and exerting yourself.  The breathing machine keeps your lungs expanded and your temperature down. ° ° °DIET:  As you were doing prior to hospitalization, we recommend a well-balanced diet. ° °DRESSING / WOUND CARE / SHOWERING ° °Keep the surgical dressing until follow up.  The dressing is water proof, so you can shower without any extra covering.  IF THE DRESSING FALLS OFF or the wound gets wet inside, change the dressing with sterile gauze.  Please use good hand washing techniques before changing the dressing.  Do not use any lotions or creams on the incision until instructed by your surgeon.   ° °ACTIVITY ° °Increase activity slowly as tolerated, but follow the weight bearing instructions below.   °No driving for 6 weeks or until further direction given by your physician.  You cannot drive while taking narcotics.  °No lifting or carrying greater than 10 lbs. until further directed by your surgeon. °Avoid periods of inactivity such as sitting longer than an hour when not asleep. This helps prevent blood clots.  °You may return to work once you are authorized by your doctor.  ° ° ° °WEIGHT BEARING  ° °Weight bearing as tolerated with assist device (walker, cane,  etc) as directed, use it as long as suggested by your surgeon or therapist, typically at least 4-6 weeks. ° ° °EXERCISES ° °Results after joint replacement surgery are often greatly improved when you follow the exercise, range of motion and muscle strengthening exercises prescribed by your doctor. Safety measures are also important to protect the joint from further injury. Any time any of these exercises cause you to have increased pain or swelling, decrease what you are doing until you are comfortable again and then slowly increase them. If you have problems or questions, call your caregiver or physical therapist for advice.  ° °Rehabilitation is important following a joint replacement. After just a few days of immobilization, the muscles of the leg can become weakened and shrink (atrophy).  These exercises are designed to build up the tone and strength of the thigh and leg muscles and to improve motion. Often times heat used for twenty to thirty minutes before working out will loosen up your tissues and help with improving the range of motion but do not use heat for the first two weeks following surgery (sometimes heat can increase post-operative swelling).  ° °These exercises can be done on a training (exercise) mat, on the floor, on a table or on a bed. Use whatever works the best and is most comfortable for you.    Use music or television while you are exercising so that the exercises are a pleasant break in your   day. This will make your life better with the exercises acting as a break in your routine that you can look forward to.   Perform all exercises about fifteen times, three times per day or as directed.  You should exercise both the operative leg and the other leg as well.  Exercises include:   Quad Sets - Tighten up the muscle on the front of the thigh (Quad) and hold for 5-10 seconds.   Straight Leg Raises - With your knee straight (if you were given a brace, keep it on), lift the leg to 60  degrees, hold for 3 seconds, and slowly lower the leg.  Perform this exercise against resistance later as your leg gets stronger.  Leg Slides: Lying on your back, slowly slide your foot toward your buttocks, bending your knee up off the floor (only go as far as is comfortable). Then slowly slide your foot back down until your leg is flat on the floor again.  Angel Wings: Lying on your back spread your legs to the side as far apart as you can without causing discomfort.  Hamstring Strength:  Lying on your back, push your heel against the floor with your leg straight by tightening up the muscles of your buttocks.  Repeat, but this time bend your knee to a comfortable angle, and push your heel against the floor.  You may put a pillow under the heel to make it more comfortable if necessary.   A rehabilitation program following joint replacement surgery can speed recovery and prevent re-injury in the future due to weakened muscles. Contact your doctor or a physical therapist for more information on knee rehabilitation.    CONSTIPATION  Constipation is defined medically as fewer than three stools per week and severe constipation as less than one stool per week.  Even if you have a regular bowel pattern at home, your normal regimen is likely to be disrupted due to multiple reasons following surgery.  Combination of anesthesia, postoperative narcotics, change in appetite and fluid intake all can affect your bowels.   YOU MUST use at least one of the following options; they are listed in order of increasing strength to get the job done.  They are all available over the counter, and you may need to use some, POSSIBLY even all of these options:    Drink plenty of fluids (prune juice may be helpful) and high fiber foods Colace 100 mg by mouth twice a day  Senokot for constipation as directed and as needed Dulcolax (bisacodyl), take with full glass of water  Miralax (polyethylene glycol) once or twice a day as  needed.  If you have tried all these things and are unable to have a bowel movement in the first 3-4 days after surgery call either your surgeon or your primary doctor.    If you experience loose stools or diarrhea, hold the medications until you stool forms back up.  If your symptoms do not get better within 1 week or if they get worse, check with your doctor.  If you experience "the worst abdominal pain ever" or develop nausea or vomiting, please contact the office immediately for further recommendations for treatment.   ITCHING:  If you experience itching with your medications, try taking only a single pain pill, or even half a pain pill at a time.  You can also use Benadryl over the counter for itching or also to help with sleep.   TED HOSE STOCKINGS:  Use stockings on both  legs until for at least 2 weeks or as directed by physician office. They may be removed at night for sleeping.  MEDICATIONS:  See your medication summary on the After Visit Summary that nursing will review with you.  You may have some home medications which will be placed on hold until you complete the course of blood thinner medication.  It is important for you to complete the blood thinner medication as prescribed.  PRECAUTIONS:  If you experience chest pain or shortness of breath - call 911 immediately for transfer to the hospital emergency department.   If you develop a fever greater that 101 F, purulent drainage from wound, increased redness or drainage from wound, foul odor from the wound/dressing, or calf pain - CONTACT YOUR SURGEON.                                                   FOLLOW-UP APPOINTMENTS:  If you do not already have a post-op appointment, please call the office for an appointment to be seen by your surgeon.  Guidelines for how soon to be seen are listed in your After Visit Summary, but are typically between 1-4 weeks after surgery.  OTHER INSTRUCTIONS:   Knee Replacement:  Do not place pillow  under knee, focus on keeping the knee straight while resting. CPM instructions: 0-90 degrees, 2 hours in the morning, 2 hours in the afternoon, and 2 hours in the evening. Place foam block, curve side up under heel at all times except when in CPM or when walking.  DO NOT modify, tear, cut, or change the foam block in any way.  POST-OPERATIVE OPIOID TAPER INSTRUCTIONS: It is important to wean off of your opioid medication as soon as possible. If you do not need pain medication after your surgery it is ok to stop day one. Opioids include: Codeine, Hydrocodone(Norco, Vicodin), Oxycodone(Percocet, oxycontin) and hydromorphone amongst others.  Long term and even short term use of opiods can cause: Increased pain response Dependence Constipation Depression Respiratory depression And more.  Withdrawal symptoms can include Flu like symptoms Nausea, vomiting And more Techniques to manage these symptoms Hydrate well Eat regular healthy meals Stay active Use relaxation techniques(deep breathing, meditating, yoga) Do Not substitute Alcohol to help with tapering If you have been on opioids for less than two weeks and do not have pain than it is ok to stop all together.  Plan to wean off of opioids This plan should start within one week post op of your joint replacement. Maintain the same interval or time between taking each dose and first decrease the dose.  Cut the total daily intake of opioids by one tablet each day Next start to increase the time between doses. The last dose that should be eliminated is the evening dose.   MAKE SURE YOU:  Understand these instructions.  Get help right away if you are not doing well or get worse.    Thank you for letting us be a part of your medical care team.  It is a privilege we respect greatly.  We hope these instructions will help you stay on track for a fast and full recovery!

## 2022-01-04 NOTE — Anesthesia Procedure Notes (Signed)
Anesthesia Regional Block: Adductor canal block  ? ?Pre-Anesthetic Checklist: , timeout performed,  Correct Patient, Correct Site, Correct Laterality,  Correct Procedure, Correct Position, site marked,  Risks and benefits discussed,  Surgical consent,  Pre-op evaluation,  At surgeon's request and post-op pain management ? ?Laterality: Right ? ?Prep: chloraprep     ?  ?Needles:  ?Injection technique: Single-shot ? ?Needle Type: Echogenic Needle   ? ? ?Needle Length: 9cm  ?Needle Gauge: 21  ? ? ? ?Additional Needles: ? ? ?Procedures:,,,, ultrasound used (permanent image in chart),,    ?Narrative:  ?Start time: 01/04/2022 5:53 AM ?End time: 01/04/2022 5:58 AM ?Injection made incrementally with aspirations every 5 mL. ? ?Performed by: Personally  ?Anesthesiologist: Suzette Battiest, MD ? ? ? ? ?

## 2022-01-05 ENCOUNTER — Encounter (HOSPITAL_COMMUNITY): Payer: Self-pay | Admitting: Orthopedic Surgery

## 2022-01-05 DIAGNOSIS — H3530 Unspecified macular degeneration: Secondary | ICD-10-CM | POA: Diagnosis present

## 2022-01-05 DIAGNOSIS — Z79890 Hormone replacement therapy (postmenopausal): Secondary | ICD-10-CM | POA: Diagnosis not present

## 2022-01-05 DIAGNOSIS — G4700 Insomnia, unspecified: Secondary | ICD-10-CM | POA: Diagnosis present

## 2022-01-05 DIAGNOSIS — G8929 Other chronic pain: Secondary | ICD-10-CM | POA: Diagnosis present

## 2022-01-05 DIAGNOSIS — Z96651 Presence of right artificial knee joint: Secondary | ICD-10-CM

## 2022-01-05 DIAGNOSIS — Z791 Long term (current) use of non-steroidal anti-inflammatories (NSAID): Secondary | ICD-10-CM | POA: Diagnosis not present

## 2022-01-05 DIAGNOSIS — Z6837 Body mass index (BMI) 37.0-37.9, adult: Secondary | ICD-10-CM | POA: Diagnosis not present

## 2022-01-05 DIAGNOSIS — F32A Depression, unspecified: Secondary | ICD-10-CM | POA: Diagnosis present

## 2022-01-05 DIAGNOSIS — Z79891 Long term (current) use of opiate analgesic: Secondary | ICD-10-CM | POA: Diagnosis not present

## 2022-01-05 DIAGNOSIS — Z888 Allergy status to other drugs, medicaments and biological substances status: Secondary | ICD-10-CM | POA: Diagnosis not present

## 2022-01-05 DIAGNOSIS — I4510 Unspecified right bundle-branch block: Secondary | ICD-10-CM | POA: Diagnosis present

## 2022-01-05 DIAGNOSIS — Z8042 Family history of malignant neoplasm of prostate: Secondary | ICD-10-CM | POA: Diagnosis not present

## 2022-01-05 DIAGNOSIS — Z8261 Family history of arthritis: Secondary | ICD-10-CM | POA: Diagnosis not present

## 2022-01-05 DIAGNOSIS — Z7982 Long term (current) use of aspirin: Secondary | ICD-10-CM | POA: Diagnosis not present

## 2022-01-05 DIAGNOSIS — Z79899 Other long term (current) drug therapy: Secondary | ICD-10-CM | POA: Diagnosis not present

## 2022-01-05 DIAGNOSIS — Z96652 Presence of left artificial knee joint: Secondary | ICD-10-CM | POA: Diagnosis present

## 2022-01-05 DIAGNOSIS — Z801 Family history of malignant neoplasm of trachea, bronchus and lung: Secondary | ICD-10-CM | POA: Diagnosis not present

## 2022-01-05 DIAGNOSIS — E669 Obesity, unspecified: Secondary | ICD-10-CM | POA: Diagnosis present

## 2022-01-05 DIAGNOSIS — D62 Acute posthemorrhagic anemia: Secondary | ICD-10-CM | POA: Diagnosis not present

## 2022-01-05 DIAGNOSIS — I10 Essential (primary) hypertension: Secondary | ICD-10-CM | POA: Diagnosis present

## 2022-01-05 DIAGNOSIS — M1711 Unilateral primary osteoarthritis, right knee: Secondary | ICD-10-CM | POA: Diagnosis present

## 2022-01-05 DIAGNOSIS — E785 Hyperlipidemia, unspecified: Secondary | ICD-10-CM | POA: Diagnosis present

## 2022-01-05 DIAGNOSIS — Z9049 Acquired absence of other specified parts of digestive tract: Secondary | ICD-10-CM | POA: Diagnosis not present

## 2022-01-05 DIAGNOSIS — Z8249 Family history of ischemic heart disease and other diseases of the circulatory system: Secondary | ICD-10-CM | POA: Diagnosis not present

## 2022-01-05 DIAGNOSIS — E039 Hypothyroidism, unspecified: Secondary | ICD-10-CM | POA: Diagnosis present

## 2022-01-05 LAB — CBC
HCT: 33.7 % — ABNORMAL LOW (ref 36.0–46.0)
Hemoglobin: 11.2 g/dL — ABNORMAL LOW (ref 12.0–15.0)
MCH: 30.4 pg (ref 26.0–34.0)
MCHC: 33.2 g/dL (ref 30.0–36.0)
MCV: 91.6 fL (ref 80.0–100.0)
Platelets: 185 10*3/uL (ref 150–400)
RBC: 3.68 MIL/uL — ABNORMAL LOW (ref 3.87–5.11)
RDW: 12.9 % (ref 11.5–15.5)
WBC: 6.6 10*3/uL (ref 4.0–10.5)
nRBC: 0 % (ref 0.0–0.2)

## 2022-01-05 LAB — BASIC METABOLIC PANEL
Anion gap: 5 (ref 5–15)
BUN: 19 mg/dL (ref 8–23)
CO2: 26 mmol/L (ref 22–32)
Calcium: 8.4 mg/dL — ABNORMAL LOW (ref 8.9–10.3)
Chloride: 98 mmol/L (ref 98–111)
Creatinine, Ser: 0.84 mg/dL (ref 0.44–1.00)
GFR, Estimated: >60 mL/min (ref >60)
Glucose, Bld: 146 mg/dL — ABNORMAL HIGH (ref 70–99)
Potassium: 4.4 mmol/L (ref 3.5–5.1)
Sodium: 129 mmol/L — ABNORMAL LOW (ref 135–145)

## 2022-01-05 MED ORDER — TIZANIDINE HCL 4 MG PO TABS
2.0000 mg | ORAL_TABLET | Freq: Three times a day (TID) | ORAL | Status: DC
  Administered 2022-01-05 – 2022-01-06 (×5): 2 mg via ORAL
  Filled 2022-01-05 (×5): qty 1

## 2022-01-05 NOTE — TOC Transition Note (Signed)
Transition of Care (TOC) - CM/SW Discharge Note ? ? ?Patient Details  ?Name: Allison Parker ?MRN: [id number] ?Date of Birth: 1941/05/03 ? ?Transition of Care (TOC) CM/SW Contact:  ?Belle Charlie, LCSW ?Phone Number: ?01/05/2022, 9:38 AM ? ? ?Clinical Narrative:    ?Met with pt and confirming she has all needed DME at home. HHPT prearranged with [facility].  No TOC needs. ? ? ?Final next level of care: [facility] ?Barriers to Discharge: No Barriers Identified ? ? ?Patient Goals and CMS Choice ?Patient states their goals for this hospitalization and ongoing recovery are:: return home ?  ?  ? ?Discharge Placement ?  ?           ?  ?  ?  ?  ? ?Discharge Plan and Services ?  ?  ?           ?DME Arranged: N/A ?DME Agency: NA ?  ?  ?  ?HH Arranged: PT ?[facility] Agency: [facility] ?  ?  ?  ? ?Social Determinants of Health (SDOH) Interventions ?  ? ? ?Readmission Risk Interventions ?No flowsheet data found. ? ? ? ? ?

## 2022-01-05 NOTE — Plan of Care (Signed)
  Problem: Activity: Goal: Ability to avoid complications of mobility impairment will improve Outcome: Progressing   Problem: Clinical Measurements: Goal: Postoperative complications will be avoided or minimized Outcome: Progressing   Problem: Pain Management: Goal: Pain level will decrease with appropriate interventions Outcome: Progressing   

## 2022-01-05 NOTE — Progress Notes (Signed)
Physical Therapy Treatment ?Patient Details ?Name: Allison Parker ?MRN: [id number] ?DOB: January 27, 1941 ?Today's Date: 01/05/2022 ? ? ?History of Present Illness 81 yo female s/p R TKA. PMH: HTN, L TKA, RBBB, opioid dependence, thalamic pain syndrome, CVA with residual R sided weakness ? ?  ?PT Comments  ? ? Pt seated in recliner upon entry. Pt required min guard assist for transfers and ambulation with +2 recliner follow for safety only, no physical assist required. Pt completed HEP with mulitmodal cuing for proper form. Pain level monitored throughout, pt reported minor increase in pain so ice applied at close of session. We will try to see patient for second visit today as schedule allows with primary goal of stair training. We will continue to follow the patient acutely to promote independence with functional mobility.  ?  ?Recommendations for follow up therapy are one component of a multi-disciplinary discharge planning process, led by the attending physician.  Recommendations may be updated based on patient status, additional functional criteria and insurance authorization. ? ?Follow Up Recommendations ? Follow physician's recommendations for discharge plan and follow up therapies ?  ?  ?Assistance Recommended at Discharge Set up Supervision/Assistance  ?Patient can return home with the following A little help with walking and/or transfers;Assistance with cooking/housework;Help with stairs or ramp for entrance;Assist for transportation;A little help with bathing/dressing/bathroom ?  ?Equipment Recommendations ? None recommended by PT (Pt has RW at home)  ?  ?Recommendations for Other Services   ? ? ?  ?Precautions / Restrictions Precautions ?Precautions: Knee ?Precaution Booklet Issued: No ?Precaution Comments: Reviewed precautions (no pillow under knee) ?Restrictions ?Weight Bearing Restrictions: No ?RLE Weight Bearing: Weight bearing as tolerated  ?  ? ?Mobility ? Bed Mobility ?  ?  ?  ?  ?  ?  ?  ?General bed  mobility comments: OOB in recliner upon entry and exit ?  ? ?Transfers ?Overall transfer level: Needs assistance ?Equipment used: Rolling walker (2 wheels) ?Transfers: Sit to/from Stand ?Sit to Stand: Min guard ?  ?  ?  ?  ?  ?General transfer comment: Pt min guard for safety during sit to stand and stand to sit, no physical assist required. Moderate verbal cues for hand placement and sequencing. ?  ? ?Ambulation/Gait ?Ambulation/Gait assistance: Min guard, +2 safety/equipment ?Gait Distance (Feet): 50 Feet ?Assistive device: Rolling walker (2 wheels) ?Gait Pattern/deviations: Step-to pattern, Step-through pattern, Decreased stance time - right, Decreased weight shift to right ?Gait velocity: decreased ?  ?  ?General Gait Details: Pt ambulated with step to pattern progressing to step through pattern with increased fluidity; min guard for safety with +2 recliner follow. Pt required several short standing rest breaks to allow for RUE to rest (reported tingling and numbness secondary to prior stroke). Pt's RLE in high degree of external rotation secondary to prior stroke but can correct with verbal cuing. ? ? ?Stairs ?  ?  ?  ?  ?  ? ? ?Wheelchair Mobility ?  ? ?Modified Rankin (Stroke Patients Only) ?  ? ? ?  ?Balance Overall balance assessment: History of Falls, Needs assistance ?Sitting-balance support: Feet unsupported, Single extremity supported ?Sitting balance-Leahy Scale: Fair ?  ?  ?Standing balance support: Reliant on assistive device for balance, During functional activity, Single extremity supported ?Standing balance-Leahy Scale: Poor ?Standing balance comment: Pt reliant on UE support during functional ambulation. ?  ?  ?  ?  ?  ?  ?  ?  ?  ?  ?  ?  ? ?  ?  Cognition Arousal/Alertness: Awake/alert ?Behavior During Therapy: [facility] for tasks assessed/performed ?Overall Cognitive Status: Within Functional Limits for tasks assessed ?  ?  ?  ?  ?  ?  ?  ?  ?  ?  ?  ?  ?  ?  ?  ?  ?  ?  ?  ? ?  ?Exercises Total Joint  Exercises ?Ankle Circles/Pumps: AROM, Both, 10 reps ?Quad Sets: AROM, Right, 10 reps ?Heel Slides: AAROM, Right, 10 reps ? ?  ?General Comments   ?  ?  ? ?Pertinent Vitals/Pain Pain Assessment ?Pain Assessment: 0-10 ?Pain Score: 4  ?Pain Location: right knee ?Pain Descriptors / Indicators: Aching, Discomfort, Sore ?Pain Intervention(s): Limited activity within patient's tolerance, Monitored during session, Repositioned, Ice applied  ? ? ?Home Living   ?  ?  ?  ?  ?  ?  ?  ?  ?  ?   ?  ?Prior Function    ?  ?  ?   ? ?PT Goals (current goals can now be found in the care plan section) Acute Rehab PT Goals ?Patient Stated Goal: To go home ?PT Goal Formulation: With patient ?Time For Goal Achievement: 01/11/22 ?Potential to Achieve Goals: Good ?Progress towards PT goals: Progressing toward goals ? ?  ?Frequency ? ? ? 7X/week ? ? ? ?  ?PT Plan Current plan remains appropriate  ? ? ?Co-evaluation   ?  ?  ?  ?  ? ?  ?AM-PAC PT "6 Clicks" Mobility   ?Outcome Measure ? Help needed turning from your back to your side while in a flat bed without using bedrails?: A Little ?Help needed moving from lying on your back to sitting on the side of a flat bed without using bedrails?: A Little ?Help needed moving to and from a bed to a chair (including a wheelchair)?: A Little ?Help needed standing up from a chair using your arms (e.g., wheelchair or bedside chair)?: A Little ?Help needed to walk in hospital room?: A Little ?Help needed climbing 3-5 steps with a railing? : A Lot ?6 Click Score: 17 ? ?  ?End of Session Equipment Utilized During Treatment: Gait belt ?Activity Tolerance: Patient tolerated treatment well ?Patient left: in chair;with call bell/phone within reach;with chair alarm set ?Nurse Communication: Mobility status ?PT Visit Diagnosis: Unsteadiness on feet (R26.81);Difficulty in walking, not elsewhere classified (R26.2);Muscle weakness (generalized) (M62.81);Other abnormalities of gait and mobility (R26.89) ?  ? ? ?Time:  3893-7342 ?PT Time Calculation (min) (ACUTE ONLY): 21 min ? ?Charges:  $Gait Training: 8-22 mins          ?          ? ?Coolidge Breeze, PT, DPT ?[facility] Rehabilitation Department ?Office: [phone] ?Pager: [phone] ? ? ?Coolidge Breeze ?01/05/2022, 10:57 AM ? ?

## 2022-01-05 NOTE — Progress Notes (Signed)
PATIENT ID: Allison Parker  MRN: [id number]  DOB/AGE:  03-04-1941 / 81 y.o.  ?1 Day Post-Op Procedure(s) (LRB): ?RIGHT TOTAL KNEE ARTHROPLASTY (Right) ? ?  PROGRESS NOTE ?Subjective: ?Patient is alert, oriented, no Nausea, no Vomiting, yes passing gas. Taking PO well. ?Denies SOB, Chest or Calf Pain. Using Incentive Spirometer, PAS in place. ?Ambulate 50', Patient reports pain as 5/10 .   ? ?Objective: ?Vital signs in last 24 hours: ?Vitals:  ? 01/04/22 1833 01/04/22 2007 01/05/22 0022 01/05/22 0455  ?BP:  136/65 118/73 132/80  ?Pulse:  84 92 77  ?Resp:  '16 16 17  '$ ?Temp:  99.6 ?F (37.6 ?C) 99.2 ?F (37.3 ?C) 98.2 ?F (36.8 ?C)  ?TempSrc:  Oral Oral Oral  ?SpO2: 93% 95% 98% 100%  ?Weight:      ?Height:      ?  ?  ?Intake/Output from previous day: ?I/O last 3 completed shifts: ?In: 5625 [P.O.:840; I.V.:2774; IV Piggyback:2050] ?Out: 250 [Urine:200; Blood:50] ?  ?Intake/Output this shift: ?No intake/output data recorded.  ? ?LABORATORY DATA: ?Recent Labs  ?  01/05/22 ?6389  ?WBC 6.6  ?HGB 11.2*  ?HCT 33.7*  ?PLT 185  ?NA 129*  ?K 4.4  ?CL 98  ?CO2 26  ?BUN 19  ?CREATININE 0.84  ?GLUCOSE 146*  ?CALCIUM 8.4*  ? ? ?Examination: ?Neurologically intact ?ABD soft ?Neurovascular intact ?Sensation intact distally ?Intact pulses distally ?Dorsiflexion/Plantar flexion intact ?Incision: dressing C/D/I ?No cellulitis present ?Compartment soft} ? ?Assessment:   ?1 Day Post-Op Procedure(s) (LRB): ?RIGHT TOTAL KNEE ARTHROPLASTY (Right) ?ADDITIONAL DIAGNOSIS: Expected Acute Blood Loss Anemia, RBBB, chronic pain mgt on opioids ? ?Patient's anticipated LOS is less than 2 midnights, meeting these requirements: ?- Younger than 79 ?- Lives within 1 hour of care ?- Has a competent adult at home to recover with post-op recover ?- NO history of ? - Chronic pain requiring opiods ? - Diabetes ? - Coronary Artery Disease ? - Heart failure ? - Heart attack ? - Stroke ? - DVT/VTE ? - Cardiac arrhythmia ? - Respiratory Failure/COPD ? - Renal  failure ? - Anemia ? - Advanced Liver disease ? ?  ? ?Plan: ?PT/OT WBAT, AROM and PROM  ?DVT Prophylaxis:  SCDx72hrs, ASA 81 mg BID x 2 weeks ?DISCHARGE PLAN: Home, when passes PT ?DISCHARGE NEEDS: HHPT, Walker, and 3-in-1 comode seat ? ?   ?Kerin Salen ?01/05/2022, 7:37 AM ? Patient ID: Quintella Baton Shukla, female   DOB: 07/30/1941, 81 y.o.   MRN: [id number] ? ?

## 2022-01-05 NOTE — Progress Notes (Signed)
Physical Therapy Treatment ?Patient Details ?Name: Allison Parker ?MRN: [id number] ?DOB: 1940/11/05 ?Today's Date: 01/05/2022 ? ? ?History of Present Illness 81 yo female s/p R TKA. PMH: HTN, L TKA, RBBB, opioid dependence, thalamic pain syndrome, CVA with residual R sided weakness ? ?  ?PT Comments  ? ? Pt progressing slowly, requiring +2 assist for sit<>stand and pivot transfers, reporting 10/10 pain despite meds.  RN notified. Feel pt will need additional day of PT for safe d/c plan   ?Recommendations for follow up therapy are one component of a multi-disciplinary discharge planning process, led by the attending physician.  Recommendations may be updated based on patient status, additional functional criteria and insurance authorization. ? ?Follow Up Recommendations ? Follow physician's recommendations for discharge plan and follow up therapies ?  ?  ?Assistance Recommended at Discharge Set up Supervision/Assistance  ?Patient can return home with the following A little help with walking and/or transfers;Assistance with cooking/housework;Help with stairs or ramp for entrance;Assist for transportation;A little help with bathing/dressing/bathroom ?  ?Equipment Recommendations ? None recommended by PT (Pt has RW at home)  ?  ?Recommendations for Other Services   ? ? ?  ?Precautions / Restrictions Precautions ?Precautions: Knee ?Precaution Booklet Issued: No ?Precaution Comments: Reviewed precautions (no pillow under knee) ?Restrictions ?Weight Bearing Restrictions: No ?RLE Weight Bearing: Weight bearing as tolerated  ?  ? ?Mobility ? Bed Mobility ?Overal bed mobility: Needs Assistance ?Bed Mobility: Sit to Supine ?  ?  ?  ?Sit to supine: Mod assist, +2 for physical assistance, +2 for safety/equipment ?  ?General bed mobility comments: assist with LEs and trunk to supine, incr time, cues to self assist ?  ? ?Transfers ?Overall transfer level: Needs assistance ?Equipment used: Rolling walker (2 wheels) ?Transfers: Sit  to/from Stand, Bed to chair/wheelchair/BSC ?Sit to Stand: Mod assist, +2 physical assistance, +2 safety/equipment ?  ?Step pivot transfers: Mod assist, +2 physical assistance, +2 safety/equipment ?  ?  ?  ?General transfer comment: +2 assist to rise and transtion to RW (after multiple attempts) multi-modal cues for hand placement, LE position, powering up with LEs, sequencing ?  ? ?Ambulation/Gait ?  ?  ?  ?  ?  ?  ?  ?  ? ? ?Stairs ?  ?  ?  ?  ?  ? ? ?Wheelchair Mobility ?  ? ?Modified Rankin (Stroke Patients Only) ?  ? ? ?  ?Balance Overall balance assessment: History of Falls, Needs assistance ?Sitting-balance support: Feet unsupported, Single extremity supported ?Sitting balance-Leahy Scale: Fair ?  ?  ?Standing balance support: Reliant on assistive device for balance, During functional activity, Single extremity supported ?Standing balance-Leahy Scale: Poor ?Standing balance comment: Pt reliant on UE support and external assist ?  ?  ?  ?  ?  ?  ?  ?  ?  ?  ?  ?  ? ?  ?Cognition Arousal/Alertness: Awake/alert ?Behavior During Therapy: [facility] for tasks assessed/performed ?Overall Cognitive Status: Within Functional Limits for tasks assessed ?  ?  ?  ?  ?  ?  ?  ?  ?  ?  ?  ?  ?  ?  ?  ?  ?  ?  ?  ? ?  ?Exercises   ? ?  ?General Comments   ?  ?  ? ?Pertinent Vitals/Pain Pain Assessment ?Pain Assessment: 0-10 ?Pain Score: 8  ?Pain Location: right knee ?Pain Descriptors / Indicators: Aching, Discomfort, Sore ?Pain Intervention(s): Limited activity within patient's tolerance, Monitored  during session, Premedicated before session, Repositioned  ? ? ?Home Living   ?  ?  ?  ?  ?  ?  ?  ?  ?  ?   ?  ?Prior Function    ?  ?  ?   ? ?PT Goals (current goals can now be found in the care plan section) Acute Rehab PT Goals ?Patient Stated Goal: To go home ?PT Goal Formulation: With patient ?Time For Goal Achievement: 01/11/22 ?Potential to Achieve Goals: Good ?Progress towards PT goals: Progressing toward goals (slowly) ? ?   ?Frequency ? ? ? 7X/week ? ? ? ?  ?PT Plan Current plan remains appropriate  ? ? ?Co-evaluation   ?  ?  ?  ?  ? ?  ?AM-PAC PT "6 Clicks" Mobility   ?Outcome Measure ? Help needed turning from your back to your side while in a flat bed without using bedrails?: A Little ?Help needed moving from lying on your back to sitting on the side of a flat bed without using bedrails?: A Little ?Help needed moving to and from a bed to a chair (including a wheelchair)?: A Little ?Help needed standing up from a chair using your arms (e.g., wheelchair or bedside chair)?: A Little ?Help needed to walk in hospital room?: A Little ?Help needed climbing 3-5 steps with a railing? : A Lot ?6 Click Score: 17 ? ?  ?End of Session Equipment Utilized During Treatment: Gait belt ?Activity Tolerance: Patient tolerated treatment well ?Patient left: in chair;with call bell/phone within reach;with chair alarm set ?Nurse Communication: Mobility status ?PT Visit Diagnosis: Unsteadiness on feet (R26.81);Difficulty in walking, not elsewhere classified (R26.2);Muscle weakness (generalized) (M62.81);Other abnormalities of gait and mobility (R26.89) ?  ? ? ?Time: 4098-1191 ?PT Time Calculation (min) (ACUTE ONLY): 27 min ? ?Charges:  $Gait Training: 8-22 mins ?$Therapeutic Activity: 8-22 mins          ?          ? Baxter Flattery, PT ? ?Acute Rehab Dept [facility]) [phone] ?Pager [phone] ? ?01/05/2022 ? ? ? ?Aidah Forquer ?01/05/2022, 3:44 PM ? ?

## 2022-01-06 DIAGNOSIS — M1711 Unilateral primary osteoarthritis, right knee: Secondary | ICD-10-CM | POA: Diagnosis present

## 2022-01-06 LAB — CBC
HCT: 31.8 % — ABNORMAL LOW (ref 36.0–46.0)
Hemoglobin: 10.3 g/dL — ABNORMAL LOW (ref 12.0–15.0)
MCH: 29.9 pg (ref 26.0–34.0)
MCHC: 32.4 g/dL (ref 30.0–36.0)
MCV: 92.2 fL (ref 80.0–100.0)
Platelets: 181 10*3/uL (ref 150–400)
RBC: 3.45 MIL/uL — ABNORMAL LOW (ref 3.87–5.11)
RDW: 13 % (ref 11.5–15.5)
WBC: 10.4 10*3/uL (ref 4.0–10.5)
nRBC: 0 % (ref 0.0–0.2)

## 2022-01-06 NOTE — Progress Notes (Signed)
Provided discharge education/instructions, all questions and concerns addressed. Pt not in acute distress, discharged home with belongings accompanied by husband. 

## 2022-01-06 NOTE — Progress Notes (Signed)
Physical Therapy Treatment ?Patient Details ?Name: Allison Parker ?MRN: [id number] ?DOB: 03/09/1941 ?Today's Date: 01/06/2022 ? ? ?History of Present Illness 81 yo female s/p R TKA. PMH: HTN, L TKA, RBBB, opioid dependence, thalamic pain syndrome, CVA with residual R sided weakness ? ?  ?PT Comments  ? ? Pt progressing well this am. Reviewed areas below in addition to stair training. Overall min/guard for safety.  Pt is ready to d/c with family assist as needed from PT standpoint. Pt in agreement, reports her son and or dtr will be assisting in addition to her husband . RN aware  ?Recommendations for follow up therapy are one component of a multi-disciplinary discharge planning process, led by the attending physician.  Recommendations may be updated based on patient status, additional functional criteria and insurance authorization. ? ?Follow Up Recommendations ? Follow physician's recommendations for discharge plan and follow up therapies ?  ?  ?Assistance Recommended at Discharge Set up Supervision/Assistance  ?Patient can return home with the following A little help with walking and/or transfers;Assistance with cooking/housework;Help with stairs or ramp for entrance;Assist for transportation;A little help with bathing/dressing/bathroom ?  ?Equipment Recommendations ? None recommended by PT (Pt has RW at home)  ?  ?Recommendations for Other Services   ? ? ?  ?Precautions / Restrictions Precautions ?Precautions: Knee ?Precaution Booklet Issued: No ?Precaution Comments: Reviewed precautions (no pillow under knee) ?Restrictions ?Weight Bearing Restrictions: No ?RLE Weight Bearing: Weight bearing as tolerated  ?  ? ?Mobility ? Bed Mobility ?  ?  ?  ?  ?  ?  ?  ?General bed mobility comments: in recliner ?  ? ?Transfers ?Overall transfer level: Needs assistance ?Equipment used: Rolling walker (2 wheels) ?Transfers: Sit to/from Stand ?Sit to Stand: Min guard ?  ?  ?  ?  ?  ?General transfer comment: min/guard for safety,  incr time. cues for hand placement ?  ? ?Ambulation/Gait ?Ambulation/Gait assistance: Min guard ?Gait Distance (Feet): 35 Feet ?Assistive device: Rolling walker (2 wheels) ?Gait Pattern/deviations: Step-to pattern, Step-through pattern, Decreased stance time - right, Decreased weight shift to right ?  ?  ?  ?General Gait Details: cues for sequence, no LOB. min/guard for safety ? ? ?Stairs ?Stairs: Yes ?Stairs assistance: Min guard, Min assist ?Stair Management: Two rails, Step to pattern, Forwards ?Number of Stairs: 3 ?General stair comments: cues for sequence and technique. pt able to return demo without LOB or knee buckling. min/guard for safety ? ? ?Wheelchair Mobility ?  ? ?Modified Rankin (Stroke Patients Only) ?  ? ? ?  ?Balance Overall balance assessment: History of Falls, Needs assistance ?Sitting-balance support: Feet unsupported, Single extremity supported ?Sitting balance-Leahy Scale: Fair ?  ?  ?Standing balance support: Reliant on assistive device for balance, During functional activity ?Standing balance-Leahy Scale: Poor ?Standing balance comment: Pt reliant on UE support ?  ?  ?  ?  ?  ?  ?  ?  ?  ?  ?  ?  ? ?  ?Cognition Arousal/Alertness: Awake/alert ?Behavior During Therapy: [facility] for tasks assessed/performed ?Overall Cognitive Status: Within Functional Limits for tasks assessed ?  ?  ?  ?  ?  ?  ?  ?  ?  ?  ?  ?  ?  ?  ?  ?  ?  ?  ?  ? ?  ?Exercises Total Joint Exercises ?Ankle Circles/Pumps: AROM, Both, 5 reps ? ?  ?General Comments   ?  ?  ? ?Pertinent Vitals/Pain Pain Assessment ?  Pain Assessment: 0-10 ?Pain Score: 5  ?Pain Location: right knee ?Pain Descriptors / Indicators: Aching, Discomfort, Sore ?Pain Intervention(s): Limited activity within patient's tolerance, Monitored during session, Premedicated before session, Repositioned  ? ? ?Home Living   ?  ?  ?  ?  ?  ?  ?  ?  ?  ?   ?  ?Prior Function    ?  ?  ?   ? ?PT Goals (current goals can now be found in the care plan section) Acute Rehab  PT Goals ?Patient Stated Goal: To go home ?PT Goal Formulation: With patient ?Time For Goal Achievement: 01/11/22 ?Potential to Achieve Goals: Good ?Progress towards PT goals: Progressing toward goals ? ?  ?Frequency ? ? ? 7X/week ? ? ? ?  ?PT Plan Current plan remains appropriate  ? ? ?Co-evaluation   ?  ?  ?  ?  ? ?  ?AM-PAC PT "6 Clicks" Mobility   ?Outcome Measure ? Help needed turning from your back to your side while in a flat bed without using bedrails?: A Little ?Help needed moving from lying on your back to sitting on the side of a flat bed without using bedrails?: A Little ?Help needed moving to and from a bed to a chair (including a wheelchair)?: A Little ?Help needed standing up from a chair using your arms (e.g., wheelchair or bedside chair)?: A Little ?Help needed to walk in hospital room?: A Little ?Help needed climbing 3-5 steps with a railing? : A Little ?6 Click Score: 18 ? ?  ?End of Session Equipment Utilized During Treatment: Gait belt ?Activity Tolerance: Patient tolerated treatment well ?Patient left: in chair;with call bell/phone within reach;with chair alarm set ?Nurse Communication: Mobility status ?PT Visit Diagnosis: Unsteadiness on feet (R26.81);Difficulty in walking, not elsewhere classified (R26.2);Muscle weakness (generalized) (M62.81);Other abnormalities of gait and mobility (R26.89) ?  ? ? ?Time: 1100-1118 ?PT Time Calculation (min) (ACUTE ONLY): 18 min ? ?Charges:  $Gait Training: 8-22 mins          ?          ? Baxter Flattery, PT ? ?Acute Rehab Dept [facility]) [phone] ?Pager [phone] ? ?01/06/2022 ? ? ? ?Allison Parker ?01/06/2022, 11:48 AM ? ?

## 2022-01-06 NOTE — Progress Notes (Signed)
DC orders/summary not updated to match intended ASA postop dosing (instructed to continue 81 mg daily instead of increasing to BID x 2 wks as outlined in Ortho progress note). Messaged Ortho PA who approved change but patient left floor before RPh able to correct paperwork & Rxs. Called and spoke with husband Gwyndolyn Saxon and conveyed correct ASA instructions. ? ?Reuel Boom, PharmD, BCPS ?[phone] ?01/06/2022, 5:39 PM ? ?

## 2022-01-06 NOTE — Discharge Summary (Signed)
?Patient ID: ?Quintella Baton Rowser ?MRN: [id number] ?DOB/AGE: 79-Aug-1942 79 y.o. ? ?Admit date: 01/04/2022 ?Discharge date: 01/06/2022 ? ?Admission Diagnoses:  ?Principal Problem: ?  Osteoarthritis of right knee ?Active Problems: ?  S/P TKR (total knee replacement), right ?  S/P TKR (total knee replacement) using cement, right ? ? ?Discharge Diagnoses:  ?Same ? ?Past Medical History:  ?Diagnosis Date  ? Arthritis   ? Complication of anesthesia   ? Depression   ? History of YAG laser capsulotomy of lens of left eye   ? Hyperlipidemia   ? Hypertension   ? Hypothyroidism   ? Insomnia   ? Macular degeneration   ? PONV (postoperative nausea and vomiting)   ? Stroke [facility])   ? Thyroid disease   ? hypothyroid  ? ? ?Surgeries: Procedure(s): ?RIGHT TOTAL KNEE ARTHROPLASTY on 01/04/2022 ?  ?Consultants:  ? ?Discharged Condition: Improved ? ?Hospital Course: Johnisha Louks Sugarman is an 81 y.o. female who was admitted 01/04/2022 for operative treatment ofOsteoarthritis of right knee. Patient has severe unremitting pain that affects sleep, daily activities, and work/hobbies. After pre-op clearance the patient was taken to the operating room on 01/04/2022 and underwent  Procedure(s): ?RIGHT TOTAL KNEE ARTHROPLASTY.   ? ?Patient was given perioperative antibiotics:  ?Anti-infectives (From admission, onward)  ? ? Start     Dose/Rate Route Frequency Ordered Stop  ? 01/04/22 0600  ceFAZolin (ANCEF) IVPB 2g/100 mL premix       ? 2 g ?200 mL/hr over 30 Minutes Intravenous On call to O.R. 01/04/22 0530 01/04/22 0736  ? ?  ?  ? ?Patient was given sequential compression devices, early ambulation, and chemoprophylaxis to prevent DVT. ? ?Patient benefited maximally from hospital stay and there were no complications.   ? ?Recent vital signs: Patient Vitals for the past 24 hrs: ? BP Temp Temp src Pulse Resp SpO2  ?01/06/22 1336 (!) 124/57 98.5 ?F (36.9 ?C) Oral 91 18 98 %  ?01/06/22 0842 (!) 132/59 98.2 ?F (36.8 ?C) Oral 97 18 99 %  ?01/06/22 0557 133/67 98.3  ?F (36.8 ?C) Oral 85 20 94 %  ?01/05/22 2202 (!) 162/85 98.3 ?F (36.8 ?C) Oral 100 -- 96 %  ?01/05/22 2201 (!) 162/85 98.3 ?F (36.8 ?C) Oral 99 18 --  ?  ? ?Recent laboratory studies:  ?Recent Labs  ?  01/05/22 ?6256 01/06/22 ?0320  ?WBC 6.6 10.4  ?HGB 11.2* 10.3*  ?HCT 33.7* 31.8*  ?PLT 185 181  ?NA 129*  --   ?K 4.4  --   ?CL 98  --   ?CO2 26  --   ?BUN 19  --   ?CREATININE 0.84  --   ?GLUCOSE 146*  --   ?CALCIUM 8.4*  --   ? ? ? ?Discharge Medications:   ?Allergies as of 01/06/2022   ? ?   Reactions  ? Ace Inhibitors Palpitations  ? ?  ? ?  ?Medication List  ?  ? ?TAKE these medications   ? ?amLODipine 5 MG tablet ?Commonly known as: NORVASC ?TAKE 1 TABLET BY MOUTH EVERY DAY ?  ?aspirin EC 81 MG tablet ?Take 1 tablet (81 mg total) by mouth daily. ?  ?atorvastatin 20 MG tablet ?Commonly known as: LIPITOR ?Take 1 tablet (20 mg total) by mouth daily. (NEEDS TO BE SEEN BEFORE NEXT REFILL) ?  ?b complex vitamins capsule ?Take 1 capsule by mouth daily. ?  ?CALCIUM + D PO ?Take 1 tablet by mouth daily. ?  ?citalopram 40 MG tablet ?Commonly  known as: CELEXA ?Take 1 tablet (40 mg total) by mouth daily. (NEEDS TO BE SEEN BEFORE NEXT REFILL) ?  ?doxylamine (Sleep) 25 MG tablet ?Commonly known as: UNISOM ?Take 25 mg by mouth at bedtime. ?  ?gabapentin 100 MG capsule ?Commonly known as: NEURONTIN ?Take 1 capsule (100 mg total) by mouth 3 (three) times daily. (NEEDS TO BE SEEN BEFORE NEXT REFILL) ?  ?levothyroxine 25 MCG tablet ?Commonly known as: SYNTHROID ?TAKE 1 TABLET BY MOUTH EVERY DAY ?  ?Melatonin 10 MG Caps ?Take 10 mg by mouth at bedtime. ?  ?meloxicam 7.5 MG tablet ?Commonly known as: MOBIC ?TAKE 1 TABLET BY MOUTH EVERY DAY ?  ?mirabegron ER 50 MG Tb24 tablet ?Commonly known as: Myrbetriq ?Take 1 tablet (50 mg total) by mouth daily. ?  ?nystatin powder ?Commonly known as: MYCOSTATIN/NYSTOP ?Apply 1 application topically 3 (three) times daily. ?What changed: when to take this ?  ?nystatin cream ?Commonly known as:  MYCOSTATIN ?Apply 1 application topically 2 (two) times daily. ?What changed: when to take this ?  ?oxyCODONE 5 MG immediate release tablet ?Commonly known as: Roxicodone ?Take 1-2 tablets (5-10 mg total) by mouth every 4 (four) hours as needed for up to 7 days for severe pain. ?What changed:  ?how much to take ?Another medication with the same name was removed. Continue taking this medication, and follow the directions you see here. ?  ?PreserVision AREDS 2 Caps ?Take 1 capsule by mouth 2 (two) times daily. ?  ?tizanidine 2 MG capsule ?Commonly known as: Zanaflex ?Take 2 capsules (4 mg total) by mouth 3 (three) times daily as needed for muscle spasms. ?  ? ?  ? ?  ?  ? ? ?  ?Durable Medical Equipment  ?(From admission, onward)  ?  ? ? ?  ? ?  Start     Ordered  ? 01/04/22 1402  DME Walker rolling  Once       ?Question:  Patient needs a walker to treat with the following condition  Answer:  Status post right knee replacement  ? 01/04/22 1401  ? 01/04/22 1402  DME 3 n 1  Once       ? 01/04/22 1401  ? ?  ?  ? ?  ? ? ?  ?Discharge Care Instructions  ?(From admission, onward)  ?  ? ? ?  ? ?  Start     Ordered  ? 01/04/22 0000  Change dressing       ?Comments: Change dressing Only if drainage exceeds 40% of window on dressing  ? 01/04/22 0857  ? ?  ?  ? ?  ? ? ?Diagnostic Studies: DG Chest 2 View ? ?Result Date: 12/23/2021 ?CLINICAL DATA:  Preoperative evaluation prior to knee surgery. EXAM: CHEST - 2 VIEW COMPARISON:  May 27, 2010 FINDINGS: Mild, diffuse, chronic appearing increased interstitial lung markings are seen. There is no evidence of focal consolidation, pleural effusion or pneumothorax. The heart size and mediastinal contours are within normal limits. There is moderate severity calcification of the aortic arch. Radiopaque surgical clips are seen within the right upper quadrant. Multilevel degenerative changes are noted throughout the thoracic spine. IMPRESSION: Chronic appearing increased interstitial lung  markings without evidence of acute or active cardiopulmonary disease. Electronically Signed   By: Virgina Norfolk M.D.   On: 12/23/2021 20:37   ? ?Disposition:  ? ?Discharge Instructions   ? ? Call MD / Call 911   Complete by: As directed ?  ? If  you experience chest pain or shortness of breath, CALL 911 and be transported to the hospital emergency room.  If you develope a fever above 101 F, pus (white drainage) or increased drainage or redness at the wound, or calf pain, call your surgeon's office.  ? Change dressing   Complete by: As directed ?  ? Change dressing Only if drainage exceeds 40% of window on dressing  ? Constipation Prevention   Complete by: As directed ?  ? Drink plenty of fluids.  Prune juice may be helpful.  You may use a stool softener, such as Colace (over the counter) 100 mg twice a day.  Use MiraLax (over the counter) for constipation as needed.  ? Diet - low sodium heart healthy   Complete by: As directed ?  ? Increase activity slowly as tolerated   Complete by: As directed ?  ? Post-operative opioid taper instructions:   Complete by: As directed ?  ? POST-OPERATIVE OPIOID TAPER INSTRUCTIONS: ?It is important to wean off of your opioid medication as soon as possible. If you do not need pain medication after your surgery it is ok to stop day one. ?Opioids include: ?Codeine, Hydrocodone(Norco, Vicodin), Oxycodone(Percocet, oxycontin) and hydromorphone amongst others.  ?Long term and even short term use of opiods can cause: ?Increased pain response ?Dependence ?Constipation ?Depression ?Respiratory depression ?And more.  ?Withdrawal symptoms can include ?Flu like symptoms ?Nausea, vomiting ?And more ?Techniques to manage these symptoms ?Hydrate well ?Eat regular healthy meals ?Stay active ?Use relaxation techniques(deep breathing, meditating, yoga) ?Do Not substitute Alcohol to help with tapering ?If you have been on opioids for less than two weeks and do not have pain than it is ok to stop all  together.  ?Plan to wean off of opioids ?This plan should start within one week post op of your joint replacement. ?Maintain the same interval or time between taking each dose and first decrease the dose.  ?

## 2022-01-06 NOTE — Progress Notes (Signed)
PATIENT ID: Perel Hauschild Fenoglio  MRN: [id number]  DOB/AGE:  1941-05-29 / 81 y.o.  ?2 Days Post-Op Procedure(s) (LRB): ?RIGHT TOTAL KNEE ARTHROPLASTY (Right) ? ?  PROGRESS NOTE ?Subjective: ?Patient is alert, oriented, no Nausea, no Vomiting, yes passing gas. Taking PO well. ?Denies SOB, Chest or Calf Pain. Using Incentive Spirometer, PAS in place. ?Ambulate WBAT with pt walking 50 ft with thrapy, Patient reports pain as 7/10 .   ? ?Objective: ?Vital signs in last 24 hours: ?Vitals:  ? 01/05/22 0920 01/05/22 2201 01/05/22 2202 01/06/22 0557  ?BP: (!) 105/59 (!) 162/85 (!) 162/85 133/67  ?Pulse: 77 99 100 85  ?Resp: '18 18  20  '$ ?Temp: (!) 97.5 ?F (36.4 ?C) 98.3 ?F (36.8 ?C) 98.3 ?F (36.8 ?C) 98.3 ?F (36.8 ?C)  ?TempSrc: Oral Oral Oral Oral  ?SpO2: 91%  96% 94%  ?Weight:      ?Height:      ?  ?  ?Intake/Output from previous day: ?I/O last 3 completed shifts: ?In: 6144 [P.O.:1320; I.V.:2774; IV Piggyback:2050] ?Out: 250 [Urine:200; Blood:50] ?  ?Intake/Output this shift: ?Total I/O ?In: 960 [P.O.:960] ?Out: 450 [Urine:450]  ? ?LABORATORY DATA: ?Recent Labs  ?  01/05/22 ?0867 01/06/22 ?0320  ?WBC 6.6 10.4  ?HGB 11.2* 10.3*  ?HCT 33.7* 31.8*  ?PLT 185 181  ?NA 129*  --   ?K 4.4  --   ?CL 98  --   ?CO2 26  --   ?BUN 19  --   ?CREATININE 0.84  --   ?GLUCOSE 146*  --   ?CALCIUM 8.4*  --   ? ? ?Examination: ?Neurologically intact ?Neurovascular intact ?Sensation intact distally ?Intact pulses distally ?Dorsiflexion/Plantar flexion intact ?Incision: dressing C/D/I and scant drainage ?No cellulitis present ?Compartment soft} ? ?Assessment:   ?2 Days Post-Op Procedure(s) (LRB): ?RIGHT TOTAL KNEE ARTHROPLASTY (Right) ?ADDITIONAL DIAGNOSIS: Expected Acute Blood Loss Anemia,RBBB, chronic pain mgt on opioids ? ?Anticipated LOS equal to or greater than 2 midnights due to ?- Age 6 and older with one or more of the following: ? - Obesity ? - Expected need for hospital services (PT, OT, Nursing) required for safe  discharge ? - Anticipated  need for postoperative skilled nursing care or inpatient rehab ? - Active co-morbidities: Chronic pain requiring opiods ?OR  ? ?- Unanticipated findings during/Post Surgery: Slow post-op progression: GI, pain control, mobility  ? ? ?Plan: ?PT/OT WBAT, AROM and PROM  ?DVT Prophylaxis:  SCDx72hrs, ASA 81 mg BID x 2 weeks ?DISCHARGE PLAN: Home ?DISCHARGE NEEDS: HHPT, Walker, and 3-in-1 comode seat ? ?   ?Joanell Rising ?01/06/2022, 6:48 AM ?  ?

## 2022-01-06 NOTE — Plan of Care (Signed)

## 2022-01-11 ENCOUNTER — Other Ambulatory Visit: Payer: Self-pay | Admitting: Family

## 2022-01-18 ENCOUNTER — Encounter: Payer: Self-pay | Admitting: Family

## 2022-01-18 ENCOUNTER — Ambulatory Visit (INDEPENDENT_AMBULATORY_CARE_PROVIDER_SITE_OTHER): Payer: Medicare Other | Admitting: Family

## 2022-01-18 VITALS — BP 141/77 | HR 86 | Temp 97.0°F | Ht 65.0 in | Wt 199.0 lb

## 2022-01-18 DIAGNOSIS — N3281 Overactive bladder: Secondary | ICD-10-CM

## 2022-01-18 DIAGNOSIS — M1711 Unilateral primary osteoarthritis, right knee: Secondary | ICD-10-CM | POA: Diagnosis not present

## 2022-01-18 DIAGNOSIS — I10 Essential (primary) hypertension: Secondary | ICD-10-CM | POA: Diagnosis not present

## 2022-01-18 DIAGNOSIS — G890 Central pain syndrome: Secondary | ICD-10-CM

## 2022-01-18 DIAGNOSIS — E039 Hypothyroidism, unspecified: Secondary | ICD-10-CM

## 2022-01-18 DIAGNOSIS — G8929 Other chronic pain: Secondary | ICD-10-CM

## 2022-01-18 DIAGNOSIS — F331 Major depressive disorder, recurrent, moderate: Secondary | ICD-10-CM

## 2022-01-18 DIAGNOSIS — Z0289 Encounter for other administrative examinations: Secondary | ICD-10-CM

## 2022-01-18 DIAGNOSIS — I6930 Unspecified sequelae of cerebral infarction: Secondary | ICD-10-CM

## 2022-01-18 DIAGNOSIS — G4700 Insomnia, unspecified: Secondary | ICD-10-CM

## 2022-01-18 DIAGNOSIS — F411 Generalized anxiety disorder: Secondary | ICD-10-CM

## 2022-01-18 DIAGNOSIS — F1120 Opioid dependence, uncomplicated: Secondary | ICD-10-CM

## 2022-01-18 DIAGNOSIS — M5442 Lumbago with sciatica, left side: Secondary | ICD-10-CM

## 2022-01-18 DIAGNOSIS — E669 Obesity, unspecified: Secondary | ICD-10-CM

## 2022-01-18 DIAGNOSIS — E785 Hyperlipidemia, unspecified: Secondary | ICD-10-CM

## 2022-01-18 MED ORDER — OXYCODONE HCL 5 MG PO CAPS
5.0000 mg | ORAL_CAPSULE | Freq: Three times a day (TID) | ORAL | 0 refills | Status: DC | PRN
Start: 2022-01-18 — End: 2022-02-09

## 2022-01-18 MED ORDER — OXYCODONE HCL 5 MG PO TABS: 5.0000 mg | ORAL_TABLET | Freq: Three times a day (TID) | ORAL | 0 refills | Status: DC | PRN

## 2022-01-18 MED ORDER — OXYBUTYNIN CHLORIDE ER 10 MG PO TB24: 10.0000 mg | ORAL_TABLET | Freq: Every day | ORAL | 1 refills | Status: DC

## 2022-01-18 MED ORDER — OXYCODONE HCL 5 MG PO TABS: 5.0000 mg | ORAL_TABLET | Freq: Three times a day (TID) | ORAL | 0 refills | Status: AC | PRN

## 2022-01-18 NOTE — Progress Notes (Signed)
? ?Subjective:  ? ? Patient ID: Allison Parker, female    DOB: 04/21/1941, 81 y.o.   MRN: [id number] ? ?Chief Complaint  ?Patient presents with  ? Medical Management of Chronic Issues  ?  Just had knee replacement 2 weeks ago    ? 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 PT states this is stable at this time.  Pt is followed by Ortho annually for arthritis. ? ?She had a total right knee replacement two weeks ago. She reports she is doing PT and reports her pain is 6 out 10 that comes and goes.  ?Hypertension ?This is a chronic problem. The current episode started more than 1 year ago. The problem has been waxing and waning since onset. The problem is uncontrolled. Associated symptoms include anxiety, malaise/fatigue and peripheral edema. Pertinent negatives include no shortness of breath. Risk factors for coronary artery disease include dyslipidemia, obesity and sedentary lifestyle. The current treatment provides moderate improvement. Identifiable causes of hypertension include a thyroid problem.  ?Thyroid Problem ?Presents for follow-up visit. Symptoms include anxiety, constipation and depressed mood. Patient reports no dry skin, fatigue or hoarse voice. The symptoms have been stable. Her past medical history is significant for hyperlipidemia.  ?Arthritis ?Presents for follow-up visit. She complains of pain and stiffness. The symptoms have been stable. Affected locations include the right knee and left knee (back). Her pain is at a severity of 7/10. Pertinent negatives include no fatigue.  ?Urinary Frequency  ?This is a chronic problem. The current episode started more than 1 year ago. The problem occurs intermittently. The problem has been waxing and waning. Associated symptoms include frequency.  ?Insomnia ?Primary symptoms: difficulty falling  asleep, frequent awakening, malaise/fatigue.   ?The current episode started more than one year. The onset quality is gradual. The problem occurs intermittently. PMH includes: depression.   ?Hyperlipidemia ?This is a chronic problem. The current episode started more than 1 year ago. The problem is controlled. Recent lipid tests were reviewed and are normal. Exacerbating diseases include obesity. Pertinent negatives include no shortness of breath. Current antihyperlipidemic treatment includes statins. The current treatment provides moderate improvement of lipids. Risk factors for coronary artery disease include dyslipidemia, hypertension, a sedentary lifestyle and post-menopausal.  ?Anxiety ?Presents for follow-up visit. Symptoms include depressed mood, excessive worry, insomnia, irritability and nervous/anxious behavior. Patient reports no restlessness or shortness of breath. Symptoms occur occasionally. The severity of symptoms is moderate.  ? ? ?Depression ?       This is a chronic problem.  The current episode started more than 1 year ago.   The onset quality is gradual.   The problem occurs intermittently.  Associated symptoms include insomnia.  Associated symptoms include no fatigue, no helplessness, no hopelessness, no restlessness and not sad.  Past medical history includes thyroid problem and anxiety.   ?Back Pain ?This is a chronic problem. The current episode started more than 1 year ago. The problem occurs intermittently. The problem has been waxing and waning since onset. The pain is present in the lumbar spine. The quality of the pain is described as aching. The pain is at a severity of 8/10. The pain is moderate. Risk factors include obesity. The treatment provided mild relief.  ? ?Current opioids rx- Oxycodone 5 mg  ?# meds rx- 54 ?Effectiveness of current  meds-stable ?Adverse reactions from pain meds-constipation ?Morphine equivalent- 13.88 ? ?Pill count performed-No ?Last drug screen - 04/23/21 ?(  high risk q24m moderate risk q632mlow risk yearly ) ?Urine drug screen today- No ?[place] yes ? ? If yes were their any concerning findings? - none ? ?Pain contract signed on:04/23/21 ? ? ?Review of Systems  ?Constitutional:  Positive for irritability and malaise/fatigue. Negative for fatigue.  ?HENT:  Negative for hoarse voice.   ?Respiratory:  Negative for shortness of breath.   ?Gastrointestinal:  Positive for constipation.  ?Genitourinary:  Positive for frequency.  ?Musculoskeletal:  Positive for arthritis, back pain and stiffness.  ?Psychiatric/Behavioral:  Positive for depression. The patient is nervous/anxious and has insomnia.   ?All other systems reviewed and are negative. ? ?   ?Objective:  ? Physical Exam ?Vitals reviewed.  ?Constitutional:   ?   General: She is not in acute distress. ?   Appearance: She is well-developed. She is obese.  ?HENT:  ?   Head: Normocephalic and atraumatic.  ?   Right Ear: Tympanic membrane normal.  ?   Left Ear: Tympanic membrane normal.  ?Eyes:  ?   Pupils: Pupils are equal, round, and reactive to light.  ?Neck:  ?   Thyroid: No thyromegaly.  ?Cardiovascular:  ?   Rate and Rhythm: Normal rate and regular rhythm.  ?   Heart sounds: Normal heart sounds. No murmur heard. ?Pulmonary:  ?   Effort: Pulmonary effort is normal. No respiratory distress.  ?   Breath sounds: Normal breath sounds. No wheezing.  ?Abdominal:  ?   General: Bowel sounds are normal. There is no distension.  ?   Palpations: Abdomen is soft.  ?   Tenderness: There is no abdominal tenderness.  ?Musculoskeletal:     ?   General: Tenderness present.  ?   Cervical back: Normal range of motion and neck supple.  ?   Comments: Pain in right knee with flexion and extension  ?Skin: ?   General: Skin is warm and dry.  ?Neurological:  ?   Mental Status: She is alert and oriented to person, place, and time.  ?   Cranial Nerves: No cranial nerve deficit.  ?   Motor: Weakness (using rolling walker) present.   ?   Deep Tendon Reflexes: Reflexes are normal and symmetric.  ?Psychiatric:     ?   Behavior: Behavior normal.     ?   Thought Content: Thought content normal.     ?   Judgment: Judgment normal.  ? ? ? ? ?BP (!) 141/77   Pulse 86   Temp (!) 97 ?F (36.1 ?C) (Temporal)   Ht '5\' 5"'$  (1.651 m)   Wt 199 lb (90.3 kg)   BMI 33.12 kg/m?  ? ?   ?Assessment & Plan:  ?JuQuintella Batoneremer comes in today with chief complaint of Medical Management of Chronic Issues (Just had knee replacement 2 weeks ago  ) ? ? ?Diagnosis and orders addressed: ? ?1. Essential hypertension, benign ? ?2. Hypothyroidism, unspecified type ? ?3. Thalamic pain syndrome ? ?4. Arthritis of right knee ? ?5. Osteoarthritis of right knee, unspecified osteoarthritis type ? ?6. Overactive bladder ?-Start oxybutynin 10 mg  ?Limit caffeine  ?- oxybutynin (DITROPAN-XL) 10 MG 24 hr tablet; Take 1 tablet (10 mg total) by mouth at bedtime.  Dispense: 90 tablet; Refill: 1 ? 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 ?- oxyCODONE (ROXICODONE) 5 MG immediate release  tablet; Take 1-2 tablets (5-10 mg total) by mouth every 8 (eight) hours as needed for up to 7 days for severe pain.  Dispense: 90 tablet; Refill: 0 ?- oxyCODONE (ROXICODONE) 5 MG immediate release tablet; Take 1 tablet (5 mg total) by mouth every 8 (eight) hours as needed for severe pain.  Dispense: 90 tablet; Refill: 0 ?- oxycodone (OXY-IR) 5 MG capsule; Take 1 capsule (5 mg total) by mouth every 8 (eight) hours as needed.  Dispense: 90 capsule; Refill: 0 ? ?8. Moderate episode of recurrent major depressive disorder (HCC) ?- oxyCODONE (ROXICODONE) 5 MG immediate release tablet; Take 1 tablet (5 mg total) by mouth every 8 (eight) hours as needed for severe pain.  Dispense: 90 tablet; Refill: 0 ? ?9. GAD (generalized anxiety disorder) ? ?10. Hyperlipidemia, unspecified hyperlipidemia type ? ?11. Insomnia, unspecified type ? ?12. Late effect of cerebrovascular accident ? ?13. Obesity (BMI  30-39.9) ? ?14. Uncomplicated opioid dependence (HCC) ?- oxyCODONE (ROXICODONE) 5 MG immediate release tablet; Take 1-2 tablets (5-10 mg total) by mouth every 8 (eight) hours as needed for up to 7 days for se

## 2022-01-18 NOTE — Patient Instructions (Signed)
Total Knee Replacement, Care After ?This sheet gives you information about how to care for yourself after your procedure. Your health care provider may also give you more specific instructions. If you have problems or questions, contact your health care provider. ?What can I expect after the procedure? ?After the procedure, it is common to have: ?Redness, pain, and swelling at the incision area. ?Stiffness. ?Discomfort. ?A small amount of blood or clear fluid coming from your incision. ?Follow these instructions at home: ?Medicines ?Take over-the-counter and prescription medicines only as told by your health care provider. ?If you were prescribed a blood thinner (anticoagulant), take it as told by your health care provider. ?Ask your health care provider if the medicine prescribed to you: ?Requires you to avoid driving or using machinery. ?Can cause constipation. You may need to take these actions to prevent or treat constipation: ?Drink enough fluid to keep your urine pale yellow. ?Take over-the-counter or prescription medicines. ?Eat foods that are high in fiber, such as beans, whole grains, and fresh fruits and vegetables. ?Limit foods that are high in fat and processed sugars, such as fried or sweet foods. ?Incision care ? ?Follow instructions from your health care provider about how to take care of your incision. Make sure you: ?Wash your hands with soap and water for at least 20 seconds before and after you change your bandage (dressing). If soap and water are not available, use hand sanitizer. ?Change your dressing as told by your health care provider. ?Leave stitches (sutures), staples, skin glue, or adhesive strips in place. These skin closures may need to stay in place for 2 weeks or longer. If adhesive strip edges start to loosen and curl up, you may trim the loose edges. Do not remove adhesive strips completely unless your health care provider tells you to do that. ?Do not take baths, swim, or use a hot  tub until your health care provider approves. ?Check your incision area every day for signs of infection. Check for: ?More redness, swelling, or pain. ?More fluid or blood. ?Warmth. ?Pus or a bad smell. ?Activity ?Rest as told by your health care provider. ?Avoid sitting for a long time without moving. Get up to take short walks every 1-2 hours. This is important to improve blood flow and breathing. Ask for help if you feel weak or unsteady. ?Follow instructions from your health care provider about using a walker, crutches, or a cane. ?You may use your legs to support (bear) your body weight as told by your health care provider. Follow instructions about how much weight you may safely support on your affected leg (weight-bearing restrictions). ?A physical therapist may show you how to get out of a bed and chair and how to go up and down stairs. You will first do this with a walker, crutches, or a cane and then without any of these devices. ?Once you are able to walk without a limp, you may stop using a walker, crutches, or a cane. ?Do exercises as told by your health care provider or physical therapist. ?Avoid high-impact activities, including running, jumping rope, and doing jumping jacks. ?Do not play contact sports until your health care provider approves. ?Return to your normal activities as told by your health care provider. Ask your health care provider what activities are safe for you. ?Managing pain, stiffness, and swelling ? ?If directed, put ice on your knee. To do this: ?Put ice in a plastic bag or use the icing device (cold flow pad) that you  were given. Follow instructions from your health care provider about how to use the icing device. ?Place a towel between your skin and the bag or between your skin and the icing device. ?Leave the ice on for 20 minutes, 2-3 times a day. ?Remove the ice if your skin turns bright red. This is very important. If you cannot feel pain, heat, or cold, you have a greater  risk of damage to the area. ?Move your toes often to reduce stiffness and swelling. ?Raise (elevate) your leg above the level of your heart while you are sitting or lying down. ?Use several pillows to keep your leg straight. ?Do not put a pillow just under the knee. If the knee is bent for a long time, this may lead to stiffness. ?Wear elastic knee support as told by your health care provider. ?Safety ? ?To help prevent falls, keep floors clear of objects you may trip over. Place items that you may need within easy reach. ?Wear an apron or tool belt with pockets for carrying objects. This leaves your hands free to help with your balance. ?Ask your health care provider when it is safe to drive. ?General instructions ?Wear compression stockings as told by your health care provider. These stockings help to prevent blood clots and reduce swelling in your legs. ?Continue with breathing exercises. This helps prevent lung infection. ?Do not use any products that contain nicotine or tobacco. These products include cigarettes, chewing tobacco, and vaping devices, such as e-cigarettes. These can delay healing after surgery. If you need help quitting, ask your health care provider. ?Tell your health care provider if you plan to have dental work. Also: ?Tell your dentist about your joint replacement. ?Ask your health care provider if there are any special instructions you need to follow before having dental care and routine cleanings. ?Keep all follow-up visits. This is important. ?Contact a health care provider if: ?You have a fever or chills. ?You have a cough or feel short of breath. ?Your medicine is not controlling your pain. ?You have any of these signs of infection: ?More redness, swelling, or pain around your incision. ?More fluid or blood coming from your incision. ?Warmth coming from your incision. ?Pus or a bad smell coming from your incision. ?You fall. ?Get help right away if: ?You have severe pain. ?You have  trouble breathing. ?You have chest pain. ?You have redness, swelling, pain, or warmth in your calf or leg. ?Your incision breaks open after sutures or staples are removed. ?These symptoms may represent a serious problem that is an emergency. Do not wait to see if the symptoms will go away. Get medical help right away. Call your local emergency services (911 in the [place] Do not drive yourself to the hospital. ?Summary ?After the procedure, it is common to have pain and swelling at the incision area, a small amount of blood or fluid coming from your incision, and stiffness. ?Follow instructions from your health care provider about how to take care of your incision. ?Use crutches, a walker, or a cane as told by your health care provider. ?This information is not intended to replace advice given to you by your health care provider. Make sure you discuss any questions you have with your health care provider. ?Document Revised: 04/01/2020 Document Reviewed: 04/01/2020 ?Elsevier Patient Education ? [place]. ? ?

## 2022-02-05 ENCOUNTER — Telehealth: Payer: Self-pay | Admitting: *Deleted

## 2022-02-05 DIAGNOSIS — F1120 Opioid dependence, uncomplicated: Secondary | ICD-10-CM

## 2022-02-05 DIAGNOSIS — G8929 Other chronic pain: Secondary | ICD-10-CM

## 2022-02-05 DIAGNOSIS — Z0289 Encounter for other administrative examinations: Secondary | ICD-10-CM

## 2022-02-05 NOTE — Telephone Encounter (Signed)
Fax from [facility] ?RE: Oxycodone HCL (IR) 5 mg cap ?Pt has been getting tablets, cap not in stock ?Other 2 Rxs written ot 01/18/22 OV are for Oxycodone 5 mg IR tablets ?Please advise & if appropriate send new Rx, PCP is not back till T 4/18 ?

## 2022-02-07 NOTE — Telephone Encounter (Signed)
If she has been getting tablets and capsules are not in stock, why can she not continue getting tablets?  ?

## 2022-02-08 NOTE — Telephone Encounter (Signed)
She can they just need the 2 new rx written for tablets per [facility]  ?

## 2022-02-09 MED ORDER — OXYCODONE HCL 5 MG PO TABS: 5.0000 mg | ORAL_TABLET | Freq: Four times a day (QID) | ORAL | 0 refills | Status: DC | PRN

## 2022-02-09 NOTE — Telephone Encounter (Signed)
Oxycodone 5 mg tab Prescription sent to pharmacy  ? ?

## 2022-02-14 ENCOUNTER — Other Ambulatory Visit: Payer: Self-pay | Admitting: Family

## 2022-02-14 DIAGNOSIS — G890 Central pain syndrome: Secondary | ICD-10-CM

## 2022-02-14 DIAGNOSIS — G8929 Other chronic pain: Secondary | ICD-10-CM

## 2022-02-20 ENCOUNTER — Other Ambulatory Visit: Payer: Self-pay | Admitting: Family

## 2022-02-20 DIAGNOSIS — I10 Essential (primary) hypertension: Secondary | ICD-10-CM

## 2022-03-11 ENCOUNTER — Other Ambulatory Visit: Payer: Self-pay | Admitting: Family

## 2022-03-11 DIAGNOSIS — E785 Hyperlipidemia, unspecified: Secondary | ICD-10-CM

## 2022-03-15 ENCOUNTER — Other Ambulatory Visit: Payer: Self-pay | Admitting: Family

## 2022-03-15 DIAGNOSIS — B372 Candidiasis of skin and nail: Secondary | ICD-10-CM

## 2022-04-06 ENCOUNTER — Ambulatory Visit: Payer: Medicare Other | Admitting: Internal Medicine

## 2022-04-19 ENCOUNTER — Ambulatory Visit: Payer: Medicare Other

## 2022-04-22 ENCOUNTER — Encounter: Payer: Self-pay | Admitting: Family

## 2022-04-22 ENCOUNTER — Ambulatory Visit (INDEPENDENT_AMBULATORY_CARE_PROVIDER_SITE_OTHER): Payer: Medicare Other | Admitting: Family

## 2022-04-22 VITALS — BP 123/71 | HR 78 | Temp 97.7°F | Ht 65.0 in | Wt 194.4 lb

## 2022-04-22 DIAGNOSIS — Z0289 Encounter for other administrative examinations: Secondary | ICD-10-CM

## 2022-04-22 DIAGNOSIS — M1711 Unilateral primary osteoarthritis, right knee: Secondary | ICD-10-CM

## 2022-04-22 DIAGNOSIS — F411 Generalized anxiety disorder: Secondary | ICD-10-CM

## 2022-04-22 DIAGNOSIS — G4700 Insomnia, unspecified: Secondary | ICD-10-CM

## 2022-04-22 DIAGNOSIS — E785 Hyperlipidemia, unspecified: Secondary | ICD-10-CM

## 2022-04-22 DIAGNOSIS — E559 Vitamin D deficiency, unspecified: Secondary | ICD-10-CM

## 2022-04-22 DIAGNOSIS — E039 Hypothyroidism, unspecified: Secondary | ICD-10-CM

## 2022-04-22 DIAGNOSIS — G8929 Other chronic pain: Secondary | ICD-10-CM

## 2022-04-22 DIAGNOSIS — I6930 Unspecified sequelae of cerebral infarction: Secondary | ICD-10-CM

## 2022-04-22 DIAGNOSIS — I10 Essential (primary) hypertension: Secondary | ICD-10-CM

## 2022-04-22 DIAGNOSIS — F331 Major depressive disorder, recurrent, moderate: Secondary | ICD-10-CM

## 2022-04-22 DIAGNOSIS — N3281 Overactive bladder: Secondary | ICD-10-CM

## 2022-04-22 DIAGNOSIS — F1120 Opioid dependence, uncomplicated: Secondary | ICD-10-CM

## 2022-04-22 DIAGNOSIS — E669 Obesity, unspecified: Secondary | ICD-10-CM

## 2022-04-22 DIAGNOSIS — M5442 Lumbago with sciatica, left side: Secondary | ICD-10-CM

## 2022-04-22 DIAGNOSIS — G890 Central pain syndrome: Secondary | ICD-10-CM

## 2022-04-22 MED ORDER — HYDROCODONE-ACETAMINOPHEN 10-325 MG PO TABS: 1.0000 | ORAL_TABLET | Freq: Three times a day (TID) | ORAL | 0 refills | Status: DC | PRN

## 2022-04-22 MED ORDER — HYDROCODONE-ACETAMINOPHEN 10-325 MG PO TABS
1.0000 | ORAL_TABLET | Freq: Three times a day (TID) | ORAL | 0 refills | Status: DC | PRN
Start: 2022-05-19 — End: 2022-07-27

## 2022-04-22 NOTE — Progress Notes (Signed)
Subjective:    Patient ID: Allison Parker, female    DOB: April 03, 1941, 81 y.o.   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 PT states this is stable at this time.  Pt is followed by Ortho annually for arthritis.   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 She  reports her pain is 3 out 10 that comes and goes.  Hypertension This is a chronic problem. The current episode started more than 1 year ago. The problem has been resolved since onset. The problem is controlled. Associated symptoms include anxiety, malaise/fatigue and peripheral edema. Pertinent negatives include no shortness of breath. Risk factors for coronary artery disease include dyslipidemia, obesity and sedentary lifestyle. The current treatment provides moderate improvement. Identifiable causes of hypertension include a thyroid problem.  Thyroid Problem Presents for follow-up visit. Symptoms include anxiety, dry skin and fatigue. Patient reports no constipation, diaphoresis or hoarse voice.  Arthritis Presents for follow-up visit. She complains of pain and stiffness. The symptoms have been stable. Affected locations include the right knee, left knee, right MCP and left MCP. Her pain is at a severity of 3/10. Associated symptoms include fatigue.  Urinary Frequency  This is a chronic problem. The current episode started more than 1 year ago. The problem occurs intermittently. The problem has been waxing and waning. Associated symptoms include frequency. Treatments tried: oxybutynin. The treatment provided moderate relief.  Insomnia Primary symptoms: difficulty falling asleep, frequent awakening, malaise/fatigue.   The current episode started more than one year. The  onset quality is gradual. The problem occurs intermittently. PMH includes: depression.   Anxiety Presents for follow-up visit. Symptoms include excessive worry, insomnia, irritability and nervous/anxious behavior. Patient reports no shortness of breath. Symptoms occur occasionally. The severity of symptoms is moderate.    Depression        This is a chronic problem.  The current episode started more than 1 year ago.   The onset quality is gradual.   The problem occurs intermittently.  Associated symptoms include fatigue and insomnia.  Associated symptoms include no helplessness, no hopelessness, not irritable and not sad.  Past medical history includes thyroid problem and anxiety.   Back Pain This is a chronic problem. The current episode started more than 1 year ago. The problem occurs intermittently. The problem has been waxing and waning since onset. The pain is present in the lumbar spine. The quality of the pain is described as aching. The pain is at a severity of 6/10. The pain is moderate. The symptoms are aggravated by standing and twisting. Risk factors include obesity. She has tried analgesics for the symptoms. The treatment provided mild relief.    Pain assessment: Cause of pain- Sciatic  Pain location- back Pain on scale of 1-10- 6 Frequency- constant What increases pain-Standing and walking What makes pain Better-resting and pain medications Effects on ADL - Hard to stand Any change in general medical condition-stable   Current opioids rx- oxycodone 5 mg # meds rx- 90 Effectiveness of current meds-stable Adverse reactions from pain meds-causing insomnia Morphine equivalent- 22.5   Pill count performed-No Last drug screen -  ( high risk q59m moderate risk q623mlow risk yearly ) Urine drug screen  today- Yes Was the [facility] reviewed- Yes  If yes were their any concerning findings? - none  Pain contract signed on: 04/23/21   Review of Systems  Constitutional:  Positive for  fatigue, irritability and malaise/fatigue. Negative for diaphoresis.  HENT:  Negative for hoarse voice.   Respiratory:  Negative for shortness of breath.   Gastrointestinal:  Negative for constipation.  Genitourinary:  Positive for frequency.  Musculoskeletal:  Positive for arthritis, back pain and stiffness.  Psychiatric/Behavioral:  Positive for depression. The patient is nervous/anxious and has insomnia.   All other systems reviewed and are negative.      Objective:   Physical Exam Vitals reviewed.  Constitutional:      General: She is not irritable.She is not in acute distress.    Appearance: She is well-developed. She is obese.  HENT:     Head: Normocephalic and atraumatic.     Right Ear: Tympanic membrane normal.     Left Ear: Tympanic membrane normal.  Eyes:     Pupils: Pupils are equal, round, and reactive to light.  Neck:     Thyroid: No thyromegaly.  Cardiovascular:     Rate and Rhythm: Normal rate and regular rhythm.     Heart sounds: Normal heart sounds. No murmur heard. Pulmonary:     Effort: Pulmonary effort is normal. No respiratory distress.     Breath sounds: Normal breath sounds. No wheezing.  Abdominal:     General: Bowel sounds are normal. There is no distension.     Palpations: Abdomen is soft.     Tenderness: There is no abdominal tenderness.  Musculoskeletal:        General: No tenderness. Normal range of motion.     Cervical back: Normal range of motion and neck supple.  Skin:    General: Skin is warm and dry.  Neurological:     Mental Status: She is alert and oriented to person, place, and time.     Cranial Nerves: No cranial nerve deficit.     Motor: Weakness (righ sided weakness) present.     Gait: Gait abnormal.     Deep Tendon Reflexes: Reflexes are normal and symmetric.  Psychiatric:        Behavior: Behavior normal.        Thought Content: Thought content normal.        Judgment: Judgment normal.     BP 123/71   Pulse 78   Temp  97.7 F (36.5 C)   Ht '5\' 5"'$  (1.651 m)   Wt 194 lb 6.4 oz (88.2 kg)   SpO2 100%   BMI 32.35 kg/m      Assessment & Plan:  Allison Parker comes in today with chief complaint of Medical Management of Chronic Issues   Diagnosis and orders addressed:  1. Essential hypertension, benign  2. Hypothyroidism, unspecified type  3. Thalamic pain syndrome  4. Osteoarthritis of right knee, unspecified osteoarthritis type   5. Arthritis of right knee   6. Overactive bladder  7. Vitamin D deficiency  8. GAD (generalized anxiety disorder)  9. Moderate episode of recurrent major depressive disorder ([place])  10. Hyperlipidemia, unspecified hyperlipidemia type  11. Insomnia, unspecified type  12. Late effect of cerebrovascular accident  56. Obesity (BMI 30-39.9)  14. Pain medication agreement signed - HYDROcodone-acetaminophen (NORCO) 10-325 MG tablet; Take 1 tablet by mouth every 8 (eight) hours as needed.  Dispense: 90 tablet; Refill: 0 - HYDROcodone-acetaminophen (NORCO) 10-325 MG tablet; Take 1 tablet by mouth  every 8 (eight) hours as needed.  Dispense: 90 tablet; Refill: 0 - HYDROcodone-acetaminophen (NORCO) 10-325 MG tablet; Take 1 tablet by mouth every 8 (eight) hours as needed.  Dispense: 90 tablet; Refill: 0  15. Uncomplicated opioid dependence (HCC) - HYDROcodone-acetaminophen (NORCO) 10-325 MG tablet; Take 1 tablet by mouth every 8 (eight) hours as needed.  Dispense: 90 tablet; Refill: 0 - HYDROcodone-acetaminophen (NORCO) 10-325 MG tablet; Take 1 tablet by mouth every 8 (eight) hours as needed.  Dispense: 90 tablet; Refill: 0 - HYDROcodone-acetaminophen (NORCO) 10-325 MG tablet; Take 1 tablet by mouth every 8 (eight) hours as needed.  Dispense: 90 tablet; Refill: 0  16. Chronic bilateral low back pain with left-sided sciatica - HYDROcodone-acetaminophen (NORCO) 10-325 MG tablet; Take 1 tablet by mouth every 8 (eight) hours as needed.  Dispense: 90 tablet; Refill: 0 -  HYDROcodone-acetaminophen (NORCO) 10-325 MG tablet; Take 1 tablet by mouth every 8 (eight) hours as needed.  Dispense: 90 tablet; Refill: 0 - HYDROcodone-acetaminophen (NORCO) 10-325 MG tablet; Take 1 tablet by mouth every 8 (eight) hours as needed.  Dispense: 90 tablet; Refill: 0   Will change oxycodone to Norco, will monitor liver enzymes  Labs pending Health Maintenance reviewed Diet and exercise encouraged  Follow up plan: 3 months    Evelina Dun, FNP

## 2022-04-22 NOTE — Patient Instructions (Signed)
Health Maintenance After Age 79 After age 79, you are at a higher risk for certain long-term diseases and infections as well as injuries from falls. Falls are a major cause of broken bones and head injuries in people who are older than age 79. Getting regular preventive care can help to keep you healthy and well. Preventive care includes getting regular testing and making lifestyle changes as recommended by your health care provider. Talk with your health care provider about: Which screenings and tests you should have. A screening is a test that checks for a disease when you have no symptoms. A diet and exercise plan that is right for you. What should I know about screenings and tests to prevent falls? Screening and testing are the best ways to find a health problem early. Early diagnosis and treatment give you the best chance of managing medical conditions that are common after age 79. Certain conditions and lifestyle choices may make you more likely to have a fall. Your health care provider may recommend: Regular vision checks. Poor vision and conditions such as cataracts can make you more likely to have a fall. If you wear glasses, make sure to get your prescription updated if your vision changes. Medicine review. Work with your health care provider to regularly review all of the medicines you are taking, including over-the-counter medicines. Ask your health care provider about any side effects that may make you more likely to have a fall. Tell your health care provider if any medicines that you take make you feel dizzy or sleepy. Strength and balance checks. Your health care provider may recommend certain tests to check your strength and balance while standing, walking, or changing positions. Foot health exam. Foot pain and numbness, as well as not wearing proper footwear, can make you more likely to have a fall. Screenings, including: Osteoporosis screening. Osteoporosis is a condition that causes  the bones to get weaker and break more easily. Blood pressure screening. Blood pressure changes and medicines to control blood pressure can make you feel dizzy. Depression screening. You may be more likely to have a fall if you have a fear of falling, feel depressed, or feel unable to do activities that you used to do. Alcohol use screening. Using too much alcohol can affect your balance and may make you more likely to have a fall. Follow these instructions at home: Lifestyle Do not drink alcohol if: Your health care provider tells you not to drink. If you drink alcohol: Limit how much you have to: 0-1 drink a day for women. 0-2 drinks a day for men. Know how much alcohol is in your drink. In the [place] one drink equals one 12 oz bottle of beer (355 mL), one 5 oz glass of wine (148 mL), or one 1 oz glass of hard liquor (44 mL). Do not use any products that contain nicotine or tobacco. These products include cigarettes, chewing tobacco, and vaping devices, such as e-cigarettes. If you need help quitting, ask your health care provider. Activity  Follow a regular exercise program to stay fit. This will help you maintain your balance. Ask your health care provider what types of exercise are appropriate for you. If you need a cane or walker, use it as recommended by your health care provider. Wear supportive shoes that have nonskid soles. Safety  Remove any tripping hazards, such as rugs, cords, and clutter. Install safety equipment such as grab bars in bathrooms and safety rails on stairs. Keep rooms and walkways   well-lit. General instructions Talk with your health care provider about your risks for falling. Tell your health care provider if: You fall. Be sure to tell your health care provider about all falls, even ones that seem minor. You feel dizzy, tiredness (fatigue), or off-balance. Take over-the-counter and prescription medicines only as told by your health care provider. These include  supplements. Eat a healthy diet and maintain a healthy weight. A healthy diet includes low-fat dairy products, low-fat (lean) meats, and fiber from whole grains, beans, and lots of fruits and vegetables. Stay current with your vaccines. Schedule regular health, dental, and eye exams. Summary Having a healthy lifestyle and getting preventive care can help to protect your health and wellness after age 79. Screening and testing are the best way to find a health problem early and help you avoid having a fall. Early diagnosis and treatment give you the best chance for managing medical conditions that are more common for people who are older than age 79. Falls are a major cause of broken bones and head injuries in people who are older than age 79. Take precautions to prevent a fall at home. Work with your health care provider to learn what changes you can make to improve your health and wellness and to prevent falls. This information is not intended to replace advice given to you by your health care provider. Make sure you discuss any questions you have with your health care provider. Document Revised: 03/02/2021 Document Reviewed: 03/02/2021 Elsevier Patient Education  2023 [employer]  

## 2022-04-22 NOTE — Addendum Note (Signed)
Addended by: Evelina Dun A on: 04/22/2022 10:50 AM   Modules accepted: Orders

## 2022-04-23 LAB — CMP14+EGFR
ALT: 11 IU/L (ref 0–32)
AST: 20 IU/L (ref 0–40)
Albumin/Globulin Ratio: 1.7 (ref 1.2–2.2)
Albumin: 4.5 g/dL (ref 3.7–4.7)
Alkaline Phosphatase: 122 IU/L — ABNORMAL HIGH (ref 44–121)
BUN/Creatinine Ratio: 22 (ref 12–28)
BUN: 20 mg/dL (ref 8–27)
Bilirubin Total: 0.4 mg/dL (ref 0.0–1.2)
CO2: 23 mmol/L (ref 20–29)
Calcium: 9.8 mg/dL (ref 8.7–10.3)
Chloride: 98 mmol/L (ref 96–106)
Creatinine, Ser: 0.91 mg/dL (ref 0.57–1.00)
Globulin, Total: 2.6 g/dL (ref 1.5–4.5)
Glucose: 102 mg/dL — ABNORMAL HIGH (ref 70–99)
Potassium: 4.7 mmol/L (ref 3.5–5.2)
Sodium: 138 mmol/L (ref 134–144)
Total Protein: 7.1 g/dL (ref 6.0–8.5)
eGFR: 65 mL/min/{1.73_m2} (ref >59)

## 2022-04-23 LAB — CBC WITH DIFFERENTIAL/PLATELET
Basophils Absolute: 0 10*3/uL (ref 0.0–0.2)
Basos: 1 %
EOS (ABSOLUTE): 0.1 10*3/uL (ref 0.0–0.4)
Eos: 2 %
Hematocrit: 42 % (ref 34.0–46.6)
Hemoglobin: 13.8 g/dL (ref 11.1–15.9)
Immature Grans (Abs): 0 10*3/uL (ref 0.0–0.1)
Immature Granulocytes: 0 %
Lymphocytes Absolute: 1.2 10*3/uL (ref 0.7–3.1)
Lymphs: 22 %
MCH: 29.4 pg (ref 26.6–33.0)
MCHC: 32.9 g/dL (ref 31.5–35.7)
MCV: 90 fL (ref 79–97)
Monocytes Absolute: 0.7 10*3/uL (ref 0.1–0.9)
Monocytes: 12 %
Neutrophils Absolute: 3.4 10*3/uL (ref 1.4–7.0)
Neutrophils: 63 %
Platelets: 234 10*3/uL (ref 150–450)
RBC: 4.69 x10E6/uL (ref 3.77–5.28)
RDW: 13.4 % (ref 11.7–15.4)
WBC: 5.5 10*3/uL (ref 3.4–10.8)

## 2022-04-23 LAB — TSH: TSH: 1.18 u[IU]/mL (ref 0.450–4.500)

## 2022-04-28 ENCOUNTER — Other Ambulatory Visit: Payer: Self-pay | Admitting: Family

## 2022-04-30 LAB — TOXASSURE SELECT 13 (MW), URINE

## 2022-05-12 ENCOUNTER — Other Ambulatory Visit: Payer: Self-pay | Admitting: Family

## 2022-05-12 DIAGNOSIS — G8929 Other chronic pain: Secondary | ICD-10-CM

## 2022-05-12 DIAGNOSIS — G890 Central pain syndrome: Secondary | ICD-10-CM

## 2022-05-28 ENCOUNTER — Other Ambulatory Visit: Payer: Self-pay | Admitting: Family

## 2022-05-28 DIAGNOSIS — E039 Hypothyroidism, unspecified: Secondary | ICD-10-CM

## 2022-05-28 DIAGNOSIS — I10 Essential (primary) hypertension: Secondary | ICD-10-CM

## 2022-06-04 ENCOUNTER — Other Ambulatory Visit: Payer: Self-pay | Admitting: Family

## 2022-06-04 DIAGNOSIS — E785 Hyperlipidemia, unspecified: Secondary | ICD-10-CM

## 2022-06-10 ENCOUNTER — Ambulatory Visit (INDEPENDENT_AMBULATORY_CARE_PROVIDER_SITE_OTHER): Payer: Medicare Other

## 2022-06-10 VITALS — Wt 194.0 lb

## 2022-06-10 DIAGNOSIS — Z0000 Encounter for general adult medical examination without abnormal findings: Secondary | ICD-10-CM | POA: Diagnosis not present

## 2022-06-10 NOTE — Progress Notes (Signed)
Subjective:   Allison Parker is a 81 y.o. female who presents for Medicare Annual (Subsequent) preventive examination.  Virtual Visit via Telephone Note  I connected with  Allison Parker on 06/10/22 at 11:15 AM EDT by telephone and verified that I am speaking with the correct person using two identifiers.  Location: Patient: Home Provider: [facility] Persons participating in the virtual visit: patient/Nurse Health Advisor   I discussed the limitations, risks, security and privacy concerns of performing an evaluation and management service by telephone and the availability of in person appointments. The patient expressed understanding and agreed to proceed.  Interactive audio and video telecommunications were attempted between this nurse and patient, however failed, due to patient having technical difficulties OR patient did not have access to video capability.  We continued and completed visit with audio only.  Some vital signs may be absent or patient reported.   Alfhild Partch E Rondo Spittler, LPN   Review of Systems     Cardiac Risk Factors include: advanced age (>36mn, >>39women);dyslipidemia;hypertension;sedentary lifestyle;obesity (BMI >30kg/m2);Other (see comment), Risk factor comments: hx of CVA     Objective:    Today's Vitals   06/10/22 1101  Weight: 194 lb (88 kg)   Body mass index is 32.28 kg/m.     06/10/2022   11:06 AM 01/04/2022    2:13 PM 12/22/2021   11:14 AM 04/16/2021    3:26 PM 05/11/2019    9:51 AM  Advanced Directives  Does Patient Have a Medical Advance Directive? No No No No No  Would patient like information on creating a medical advance directive? No - Patient declined No - Patient declined  No - Patient declined No - Patient declined    Current Medications (verified) Outpatient Encounter Medications as of 06/10/2022  Medication Sig   amLODipine (NORVASC) 5 MG tablet TAKE 1 TABLET BY MOUTH EVERY DAY   aspirin EC 81 MG tablet Take 1 tablet (81 mg total) by mouth  daily.   atorvastatin (LIPITOR) 20 MG tablet TAKE 1 TABLET (20 MG TOTAL) BY MOUTH DAILY. (NEEDS TO BE SEEN BEFORE NEXT REFILL)   b complex vitamins capsule Take 1 capsule by mouth daily.   Calcium Citrate-Vitamin D (CALCIUM + D PO) Take 1 tablet by mouth daily.   citalopram (CELEXA) 40 MG tablet Take 1 tablet (40 mg total) by mouth daily. (NEEDS TO BE SEEN BEFORE NEXT REFILL)   doxylamine, Sleep, (UNISOM) 25 MG tablet Take 25 mg by mouth at bedtime.   gabapentin (NEURONTIN) 100 MG capsule TAKE 1 CAPSULE (100 MG TOTAL) BY MOUTH THREE TIMES DAILY.   HYDROcodone-acetaminophen (NORCO) 10-325 MG tablet Take 1 tablet by mouth every 8 (eight) hours as needed.   levothyroxine (SYNTHROID) 25 MCG tablet TAKE 1 TABLET BY MOUTH EVERY DAY   Melatonin 10 MG CAPS Take 10 mg by mouth at bedtime.   meloxicam (MOBIC) 7.5 MG tablet TAKE 1 TABLET BY MOUTH EVERY DAY   Multiple Vitamins-Minerals (PRESERVISION AREDS 2) CAPS Take 1 capsule by mouth 2 (two) times daily.   nystatin (MYCOSTATIN/NYSTOP) powder APPLY TOPICALLY 3 TIMES A DAY   nystatin cream (MYCOSTATIN) Apply 1 application topically 2 (two) times daily. (Patient taking differently: Apply 1 application  topically every evening.)   oxybutynin (DITROPAN-XL) 10 MG 24 hr tablet Take 1 tablet (10 mg total) by mouth at bedtime.   tizanidine (ZANAFLEX) 2 MG capsule Take 2 capsules (4 mg total) by mouth 3 (three) times daily as needed for muscle spasms.   [  START ON 06/17/2022] HYDROcodone-acetaminophen (NORCO) 10-325 MG tablet Take 1 tablet by mouth every 8 (eight) hours as needed.   HYDROcodone-acetaminophen (NORCO) 10-325 MG tablet Take 1 tablet by mouth every 8 (eight) hours as needed.   No facility-administered encounter medications on file as of 06/10/2022.    Allergies (verified) Ace inhibitors   History: Past Medical History:  Diagnosis Date   Arthritis    Complication of anesthesia    Depression    History of YAG laser capsulotomy of lens of left eye     Hyperlipidemia    Hypertension    Hypothyroidism    Insomnia    Macular degeneration    PONV (postoperative nausea and vomiting)    Stroke [facility])    Thyroid disease    hypothyroid   Past Surgical History:  Procedure Laterality Date   BREAST SURGERY     lumpectomy   CATARACT EXTRACTION     CHOLECYSTECTOMY     EYE SURGERY     yag - bilateral, cataracts- bilateral   Torn retina Left    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   TOTAL KNEE ARTHROPLASTY Right 01/04/2022   Procedure: RIGHT TOTAL KNEE ARTHROPLASTY;  Surgeon: Frederik Pear, MD;  Location: [facility];  Service: Orthopedics;  Laterality: Right;   Family History  Problem Relation Age of Onset   Cancer Father        Lung   Arthritis Father    Dementia Mother    Arthritis Mother    Hypertension Brother    Other Paternal Grandmother        eye tumor   Cancer Paternal Grandfather        prostate   Arthritis Brother    Heart attack Brother    Social History   Socioeconomic History   Marital status: Married    Spouse name: Jimmye Norman   Number of children: 3   Years of education: Not on file   Highest education level: Not on file  Occupational History   Occupation: retired    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 One child passed away. One son lives next door.   Social Determinants of Health   Financial Resource Strain: Low Risk  (06/10/2022)   Overall Financial Resource Strain (CARDIA)    Difficulty of Paying Living Expenses: Not hard at all  Food Insecurity: No Food Insecurity (06/10/2022)   Hunger  Vital Sign    Worried About Running Out of Food in the Last Year: Never true    Ran Out of Food in the Last Year: Never true  Transportation Needs: No Transportation Needs (06/10/2022)   PRAPARE - Hydrologist (Medical): No    Lack of Transportation (Non-Medical): No  Physical Activity: Insufficiently Active (06/10/2022)   Exercise Vital Sign    Days of Exercise per Week: 7 days    Minutes of Exercise per Session: 20 min  Stress: No Stress Concern Present (06/10/2022)   [employer]  of Occupational Health - Occupational Stress Questionnaire    Feeling of Stress : Only a little  Social Connections: Socially Integrated (06/10/2022)   Social Connection and Isolation Panel [NHANES]    Frequency of Communication with Friends and Family: More than three times a week    Frequency of Social Gatherings with Friends and Family: More than three times a week    Attends Religious Services: More than 4 times per year    Active Member of [employer] or Organizations: Yes    Attends Music therapist: More than 4 times per year    Marital Status: Married    Tobacco Counseling Counseling given: Not Answered   Clinical Intake:  Pre-visit preparation completed: Yes  Pain : No/denies pain     BMI - recorded: 32.28 Nutritional Status: BMI > 30  Obese Nutritional Risks: None Diabetes: No  How often do you need to have someone help you when you read instructions, pamphlets, or other written materials from your doctor or pharmacy?: 1 - Never  Diabetic? no  Interpreter Needed?: No  Information entered by :: Johngabriel Verde, LPN   Activities of Daily Living    06/10/2022   11:06 AM 01/04/2022    2:13 PM  In your present state of health, do you have any difficulty performing the following activities:  Hearing? 0 0  Vision? 1 0  Difficulty concentrating or making decisions? 0 0  Walking or climbing stairs? 0 0  Dressing or bathing? 0 0  Doing errands,  shopping? 1 1  Comment cannot see well enough   Preparing Food and eating ? N   Using the Toilet? N   In the past six months, have you accidently leaked urine? Y   Do you have problems with loss of bowel control? N   Managing your Medications? N   Managing your Finances? N   Housekeeping or managing your Housekeeping? N     Patient Care Team: Sharion Balloon, FNP as PCP - General (Nurse Practitioner) Janina Mayo, MD as PCP - Cardiology (Cardiology) Jalene Mullet, MD as Consulting Physician (Ophthalmology) Frederik Pear, MD as Consulting Physician (Orthopedic Surgery)  Indicate any recent Medical Services you may have received from other than [facility] providers in the past year (date may be approximate).     Assessment:   This is a routine wellness examination for Allison Parker.  Hearing/Vision screen Hearing Screening - Comments:: Denies hearing difficulties   Vision Screening - Comments:: Wears rx glasses - up to date with routine eye exams with [facility] - has macular degeneration  Dietary issues and exercise activities discussed: Current Exercise Habits: The patient does not participate in regular exercise at present, Exercise limited by: orthopedic condition(s)   Goals Addressed             This Visit's Progress    Patient Stated   On track    She wants to lose some more weight and stay independent, go on vacations       Depression Screen    06/10/2022   11:05 AM 04/22/2022   10:12 AM 10/20/2021   10:07 AM 07/21/2021   10:14 AM 04/17/2021   11:41 AM 04/16/2021    3:19 PM 10/20/2020   10:11 AM  PHQ 2/9 Scores  PHQ - 2 Score 2 0 '3 3 2 2 '$ 0  PHQ- 9 Score '6  10 9 5 5 3    '$ Fall Risk    06/10/2022   11:02 AM 04/22/2022  10:12 AM 07/21/2021   10:14 AM 04/17/2021   11:40 AM 04/16/2021    3:16 PM  Fall Risk   Falls in the past year? 1 0 '1 1 1  '$ Number falls in past yr: 0  '1 1 1  '$ Injury with Fall? 0  1 0 0  Risk for fall due to : Orthopedic patient;Impaired balance/gait   History of fall(s) History of fall(s) Impaired balance/gait;History of fall(s);Impaired vision;Medication side effect;Orthopedic patient  Follow up Falls prevention discussed  Education provided Education provided Falls prevention discussed;Education provided    FALL RISK PREVENTION PERTAINING TO THE HOME:  Any stairs in or around the home? Yes  If so, are there any without handrails? No  Home free of loose throw rugs in walkways, pet beds, electrical cords, etc? Yes  Adequate lighting in your home to reduce risk of falls? Yes   ASSISTIVE DEVICES UTILIZED TO PREVENT FALLS:  Life alert? No  Use of a cane, walker or w/c? Yes  Grab bars in the bathroom? Yes  Shower chair or bench in shower? Yes  Elevated toilet seat or a handicapped toilet? Yes   TIMED UP AND GO:  Was the test performed? No . Telephonic visit  Cognitive Function:        06/10/2022   11:09 AM 05/11/2019    9:55 AM  6CIT Screen  What Year? 0 points 0 points  What month? 0 points 0 points  What time? 0 points 0 points  Count back from 20 0 points 0 points  Months in reverse 0 points 0 points  Repeat phrase 0 points 0 points  Total Score 0 points 0 points    Immunizations Immunization History  Administered Date(s) Administered   Fluad Quad(high Dose 65+) 08/16/2019, 10/20/2020   Influenza, High Dose Seasonal PF 10/11/2017, 10/10/2018   Influenza,inj,Quad PF,6+ Mos 08/14/2013, 11/11/2014, 08/08/2015, 07/13/2016   Janssen (J&J) SARS-COV-2 Vaccination 02/21/2020   PFIZER(Purple Top)SARS-COV-2 Vaccination 11/26/2020   Pneumococcal Conjugate-13 07/13/2016   Pneumococcal Polysaccharide-23 08/07/2012   Tdap 09/25/2007, 06/11/2014    TDAP status: Up to date  Flu Vaccine status: Due, Education has been provided regarding the importance of this vaccine. Advised may receive this vaccine at local pharmacy or Health Dept. Aware to provide a copy of the vaccination record if obtained from local pharmacy or Health  Dept. Verbalized acceptance and understanding.  Pneumococcal vaccine status: Up to date  Covid-19 vaccine status: Completed vaccines  Qualifies for Shingles Vaccine? Yes   Zostavax completed No   Shingrix Completed?: No.    Education has been provided regarding the importance of this vaccine. Patient has been advised to call insurance company to determine out of pocket expense if they have not yet received this vaccine. Advised may also receive vaccine at local pharmacy or Health Dept. Verbalized acceptance and understanding.  Screening Tests Health Maintenance  Topic Date Due   COVID-19 Vaccine (3 - Booster for Janssen series) 01/21/2021   INFLUENZA VACCINE  05/25/2022   Zoster Vaccines- Shingrix (1 of 2) 07/23/2022 (Originally 08/24/1993)   DEXA SCAN  10/20/2022 (Originally 01/12/2017)   TETANUS/TDAP  06/11/2024   Pneumonia Vaccine 21+ Years old  Completed   Hepatitis C Screening  Completed   HPV VACCINES  Aged Out   COLONOSCOPY (Pts 45-36yr Insurance coverage will need to be confirmed)  Discontinued    Health Maintenance  Health Maintenance Due  Topic Date Due   COVID-19 Vaccine (3 - Booster for [employer]) 01/21/2021   INFLUENZA VACCINE  05/25/2022    Colorectal cancer screening: Type of screening: Colonoscopy. Completed 07/03/2013. Repeat every 10 years  Mammogram status: Completed 12/09/2020. Repeat every year   Bone Density status: Completed 01/13/2015. Results reflect: Bone density results: OSTEOPENIA. Repeat every 2 years. - She will do this at next visit  Lung Cancer Screening: (Low Dose CT Chest recommended if Age 79-80 years, 30 pack-year currently smoking OR have quit w/in 15years.) does not qualify.   Additional Screening:  Hepatitis C Screening: does qualify; Completed 11/01/2007  Vision Screening: Recommended annual ophthalmology exams for early detection of glaucoma and other disorders of the eye. Is the patient up to date with their annual eye exam?  Yes   Who is the provider or what is the name of the office in which the patient attends annual eye exams? [facility] If pt is not established with a provider, would they like to be referred to a provider to establish care? No .   Dental Screening: Recommended annual dental exams for proper oral hygiene  Community Resource Referral / Chronic Care Management: CRR required this visit?  No   CCM required this visit?  No      Plan:     I have personally reviewed and noted the following in the patient's chart:   Medical and social history Use of alcohol, tobacco or illicit drugs  Current medications and supplements including opioid prescriptions.  She doesn't take opioids Functional ability and status Nutritional status Physical activity Advanced directives List of other physicians Hospitalizations, surgeries, and ER visits in previous 12 months Vitals Screenings to include cognitive, depression, and falls Referrals and appointments  In addition, I have reviewed and discussed with patient certain preventive protocols, quality metrics, and best practice recommendations. A written personalized care plan for preventive services as well as general preventive health recommendations were provided to patient.     Sandrea Hammond, LPN   0/06/3817   Nurse Notes: None

## 2022-06-10 NOTE — Patient Instructions (Signed)
Allison Parker , Thank you for taking time to come for your Medicare Wellness Visit. I appreciate your ongoing commitment to your health goals. Please review the following plan we discussed and let me know if I can assist you in the future.   Screening recommendations/referrals: Colonoscopy: Done 07/03/2013 - no repeat due to age  81: Done 12/09/2020 - Repeat recommended annually - patient at this time Bone Density: Done 01/13/2015 - Repeat every 2 years *due - get at next visit Recommended yearly ophthalmology/optometry visit for glaucoma screening and checkup Recommended yearly dental visit for hygiene and checkup  Vaccinations: Influenza vaccine: Due every fall Pneumococcal vaccine: Done  08/07/2012 & 07/13/2016  Tdap vaccine: Done 06/11/2014 - Repeat in 10 years  Shingles vaccine: Due - Shingrix is 2 doses 2-6 months apart and over 90% effective    Covid-19: Done Janssen 02/21/2020 & [place] 11/26/2020  Advanced directives: Advance directive discussed with you today. Even though you declined this today, please call our office should you change your mind, and we can give you the proper paperwork for you to fill out.   Conditions/risks identified: Aim for 30 minutes of exercise or brisk walking, 6-8 glasses of water, and 5 servings of fruits and vegetables each day.   Next appointment: Follow up in one year for your annual wellness visit    Preventive Care 65 Years and Older, Female Preventive care refers to lifestyle choices and visits with your health care provider that can promote health and wellness. What does preventive care include? A yearly physical exam. This is also called an annual well check. Dental exams once or twice a year. Routine eye exams. Ask your health care provider how often you should have your eyes checked. Personal lifestyle choices, including: Daily care of your teeth and gums. Regular physical activity. Eating a healthy diet. Avoiding tobacco and drug  use. Limiting alcohol use. Practicing safe sex. Taking low-dose aspirin every day. Taking vitamin and mineral supplements as recommended by your health care provider. What happens during an annual well check? The services and screenings done by your health care provider during your annual well check will depend on your age, overall health, lifestyle risk factors, and family history of disease. Counseling  Your health care provider may ask you questions about your: Alcohol use. Tobacco use. Drug use. Emotional well-being. Home and relationship well-being. Sexual activity. Eating habits. History of falls. Memory and ability to understand (cognition). Work and work Statistician. Reproductive health. Screening  You may have the following tests or measurements: Height, weight, and BMI. Blood pressure. Lipid and cholesterol levels. These may be checked every 5 years, or more frequently if you are over 75 years old. Skin check. Lung cancer screening. You may have this screening every year starting at age 57 if you have a 30-pack-year history of smoking and currently smoke or have quit within the past 15 years. Fecal occult blood test (FOBT) of the stool. You may have this test every year starting at age 58. Flexible sigmoidoscopy or colonoscopy. You may have a sigmoidoscopy every 5 years or a colonoscopy every 10 years starting at age 89. Hepatitis C blood test. Hepatitis B blood test. Sexually transmitted disease (STD) testing. Diabetes screening. This is done by checking your blood sugar (glucose) after you have not eaten for a while (fasting). You may have this done every 1-3 years. Bone density scan. This is done to screen for osteoporosis. You may have this done starting at age 63. Mammogram. This may  be done every 1-2 years. Talk to your health care provider about how often you should have regular mammograms. Talk with your health care provider about your test results, treatment  options, and if necessary, the need for more tests. Vaccines  Your health care provider may recommend certain vaccines, such as: Influenza vaccine. This is recommended every year. Tetanus, diphtheria, and acellular pertussis (Tdap, Td) vaccine. You may need a Td booster every 10 years. Zoster vaccine. You may need this after age 1. Pneumococcal 13-valent conjugate (PCV13) vaccine. One dose is recommended after age 31. Pneumococcal polysaccharide (PPSV23) vaccine. One dose is recommended after age 39. Talk to your health care provider about which screenings and vaccines you need and how often you need them. This information is not intended to replace advice given to you by your health care provider. Make sure you discuss any questions you have with your health care provider. Document Released: 11/07/2015 Document Revised: 06/30/2016 Document Reviewed: 08/12/2015 Elsevier Interactive Patient Education  2017 [place] Prevention in the Home Falls can cause injuries. They can happen to people of all ages. There are many things you can do to make your home safe and to help prevent falls. What can I do on the outside of my home? Regularly fix the edges of walkways and driveways and fix any cracks. Remove anything that might make you trip as you walk through a door, such as a raised step or threshold. Trim any bushes or trees on the path to your home. Use bright outdoor lighting. Clear any walking paths of anything that might make someone trip, such as rocks or tools. Regularly check to see if handrails are loose or broken. Make sure that both sides of any steps have handrails. Any raised decks and porches should have guardrails on the edges. Have any leaves, snow, or ice cleared regularly. Use sand or salt on walking paths during winter. Clean up any spills in your garage right away. This includes oil or grease spills. What can I do in the bathroom? Use night lights. Install grab  bars by the toilet and in the tub and shower. Do not use towel bars as grab bars. Use non-skid mats or decals in the tub or shower. If you need to sit down in the shower, use a plastic, non-slip stool. Keep the floor dry. Clean up any water that spills on the floor as soon as it happens. Remove soap buildup in the tub or shower regularly. Attach bath mats securely with double-sided non-slip rug tape. Do not have throw rugs and other things on the floor that can make you trip. What can I do in the bedroom? Use night lights. Make sure that you have a light by your bed that is easy to reach. Do not use any sheets or blankets that are too big for your bed. They should not hang down onto the floor. Have a firm chair that has side arms. You can use this for support while you get dressed. Do not have throw rugs and other things on the floor that can make you trip. What can I do in the kitchen? Clean up any spills right away. Avoid walking on wet floors. Keep items that you use a lot in easy-to-reach places. If you need to reach something above you, use a strong step stool that has a grab bar. Keep electrical cords out of the way. Do not use floor polish or wax that makes floors slippery. If you must use  wax, use non-skid floor wax. Do not have throw rugs and other things on the floor that can make you trip. What can I do with my stairs? Do not leave any items on the stairs. Make sure that there are handrails on both sides of the stairs and use them. Fix handrails that are broken or loose. Make sure that handrails are as long as the stairways. Check any carpeting to make sure that it is firmly attached to the stairs. Fix any carpet that is loose or worn. Avoid having throw rugs at the top or bottom of the stairs. If you do have throw rugs, attach them to the floor with carpet tape. Make sure that you have a light switch at the top of the stairs and the bottom of the stairs. If you do not have them,  ask someone to add them for you. What else can I do to help prevent falls? Wear shoes that: Do not have high heels. Have rubber bottoms. Are comfortable and fit you well. Are closed at the toe. Do not wear sandals. If you use a stepladder: Make sure that it is fully opened. Do not climb a closed stepladder. Make sure that both sides of the stepladder are locked into place. Ask someone to hold it for you, if possible. Clearly mark and make sure that you can see: Any grab bars or handrails. First and last steps. Where the edge of each step is. Use tools that help you move around (mobility aids) if they are needed. These include: Canes. Walkers. Scooters. Crutches. Turn on the lights when you go into a dark area. Replace any light bulbs as soon as they burn out. Set up your furniture so you have a clear path. Avoid moving your furniture around. If any of your floors are uneven, fix them. If there are any pets around you, be aware of where they are. Review your medicines with your doctor. Some medicines can make you feel dizzy. This can increase your chance of falling. Ask your doctor what other things that you can do to help prevent falls. This information is not intended to replace advice given to you by your health care provider. Make sure you discuss any questions you have with your health care provider. Document Released: 08/07/2009 Document Revised: 03/18/2016 Document Reviewed: 11/15/2014 Elsevier Interactive Patient Education  2017 [employer].

## 2022-07-17 ENCOUNTER — Other Ambulatory Visit: Payer: Self-pay | Admitting: Family

## 2022-07-17 DIAGNOSIS — N3281 Overactive bladder: Secondary | ICD-10-CM

## 2022-07-27 ENCOUNTER — Encounter: Payer: Self-pay | Admitting: Family

## 2022-07-27 ENCOUNTER — Ambulatory Visit (INDEPENDENT_AMBULATORY_CARE_PROVIDER_SITE_OTHER): Payer: Medicare Other | Admitting: Family

## 2022-07-27 VITALS — BP 137/86 | HR 83 | Temp 97.8°F | Ht 65.0 in | Wt 195.6 lb

## 2022-07-27 DIAGNOSIS — M1711 Unilateral primary osteoarthritis, right knee: Secondary | ICD-10-CM

## 2022-07-27 DIAGNOSIS — F1120 Opioid dependence, uncomplicated: Secondary | ICD-10-CM

## 2022-07-27 DIAGNOSIS — F411 Generalized anxiety disorder: Secondary | ICD-10-CM

## 2022-07-27 DIAGNOSIS — M5442 Lumbago with sciatica, left side: Secondary | ICD-10-CM

## 2022-07-27 DIAGNOSIS — F331 Major depressive disorder, recurrent, moderate: Secondary | ICD-10-CM

## 2022-07-27 DIAGNOSIS — E039 Hypothyroidism, unspecified: Secondary | ICD-10-CM

## 2022-07-27 DIAGNOSIS — N3281 Overactive bladder: Secondary | ICD-10-CM

## 2022-07-27 DIAGNOSIS — I10 Essential (primary) hypertension: Secondary | ICD-10-CM

## 2022-07-27 DIAGNOSIS — G8929 Other chronic pain: Secondary | ICD-10-CM

## 2022-07-27 DIAGNOSIS — E782 Mixed hyperlipidemia: Secondary | ICD-10-CM

## 2022-07-27 DIAGNOSIS — E669 Obesity, unspecified: Secondary | ICD-10-CM

## 2022-07-27 DIAGNOSIS — I6930 Unspecified sequelae of cerebral infarction: Secondary | ICD-10-CM

## 2022-07-27 DIAGNOSIS — E559 Vitamin D deficiency, unspecified: Secondary | ICD-10-CM

## 2022-07-27 DIAGNOSIS — Z0289 Encounter for other administrative examinations: Secondary | ICD-10-CM

## 2022-07-27 DIAGNOSIS — G890 Central pain syndrome: Secondary | ICD-10-CM

## 2022-07-27 DIAGNOSIS — G4700 Insomnia, unspecified: Secondary | ICD-10-CM

## 2022-07-27 MED ORDER — CITALOPRAM HYDROBROMIDE 40 MG PO TABS: 40.0000 mg | ORAL_TABLET | Freq: Every day | ORAL | 1 refills | Status: DC

## 2022-07-27 MED ORDER — HYDROCODONE-ACETAMINOPHEN 10-325 MG PO TABS: 1.0000 | ORAL_TABLET | Freq: Three times a day (TID) | ORAL | 0 refills | Status: DC | PRN

## 2022-07-27 MED ORDER — MELOXICAM 7.5 MG PO TABS: 7.5000 mg | ORAL_TABLET | Freq: Every day | ORAL | 1 refills | Status: DC

## 2022-07-27 MED ORDER — ATORVASTATIN CALCIUM 20 MG PO TABS: 20.0000 mg | ORAL_TABLET | Freq: Every day | ORAL | 1 refills | Status: DC

## 2022-07-27 MED ORDER — OXYBUTYNIN CHLORIDE ER 15 MG PO TB24: 15.0000 mg | ORAL_TABLET | Freq: Every day | ORAL | 1 refills | Status: DC

## 2022-07-27 MED ORDER — GABAPENTIN 100 MG PO CAPS: ORAL_CAPSULE | ORAL | 2 refills | Status: DC

## 2022-07-27 MED ORDER — AMLODIPINE BESYLATE 5 MG PO TABS: 5.0000 mg | ORAL_TABLET | Freq: Every day | ORAL | 1 refills | Status: DC

## 2022-07-27 MED ORDER — OXYBUTYNIN CHLORIDE ER 10 MG PO TB24: ORAL_TABLET | ORAL | 1 refills | Status: DC

## 2022-07-27 NOTE — Patient Instructions (Signed)
Health Maintenance After Age 79 After age 79, you are at a higher risk for certain long-term diseases and infections as well as injuries from falls. Falls are a major cause of broken bones and head injuries in people who are older than age 79. Getting regular preventive care can help to keep you healthy and well. Preventive care includes getting regular testing and making lifestyle changes as recommended by your health care provider. Talk with your health care provider about: Which screenings and tests you should have. A screening is a test that checks for a disease when you have no symptoms. A diet and exercise plan that is right for you. What should I know about screenings and tests to prevent falls? Screening and testing are the best ways to find a health problem early. Early diagnosis and treatment give you the best chance of managing medical conditions that are common after age 79. Certain conditions and lifestyle choices may make you more likely to have a fall. Your health care provider may recommend: Regular vision checks. Poor vision and conditions such as cataracts can make you more likely to have a fall. If you wear glasses, make sure to get your prescription updated if your vision changes. Medicine review. Work with your health care provider to regularly review all of the medicines you are taking, including over-the-counter medicines. Ask your health care provider about any side effects that may make you more likely to have a fall. Tell your health care provider if any medicines that you take make you feel dizzy or sleepy. Strength and balance checks. Your health care provider may recommend certain tests to check your strength and balance while standing, walking, or changing positions. Foot health exam. Foot pain and numbness, as well as not wearing proper footwear, can make you more likely to have a fall. Screenings, including: Osteoporosis screening. Osteoporosis is a condition that causes  the bones to get weaker and break more easily. Blood pressure screening. Blood pressure changes and medicines to control blood pressure can make you feel dizzy. Depression screening. You may be more likely to have a fall if you have a fear of falling, feel depressed, or feel unable to do activities that you used to do. Alcohol use screening. Using too much alcohol can affect your balance and may make you more likely to have a fall. Follow these instructions at home: Lifestyle Do not drink alcohol if: Your health care provider tells you not to drink. If you drink alcohol: Limit how much you have to: 0-1 drink a day for women. 0-2 drinks a day for men. Know how much alcohol is in your drink. In the [place] one drink equals one 12 oz bottle of beer (355 mL), one 5 oz glass of wine (148 mL), or one 1 oz glass of hard liquor (44 mL). Do not use any products that contain nicotine or tobacco. These products include cigarettes, chewing tobacco, and vaping devices, such as e-cigarettes. If you need help quitting, ask your health care provider. Activity  Follow a regular exercise program to stay fit. This will help you maintain your balance. Ask your health care provider what types of exercise are appropriate for you. If you need a cane or walker, use it as recommended by your health care provider. Wear supportive shoes that have nonskid soles. Safety  Remove any tripping hazards, such as rugs, cords, and clutter. Install safety equipment such as grab bars in bathrooms and safety rails on stairs. Keep rooms and walkways   well-lit. General instructions Talk with your health care provider about your risks for falling. Tell your health care provider if: You fall. Be sure to tell your health care provider about all falls, even ones that seem minor. You feel dizzy, tiredness (fatigue), or off-balance. Take over-the-counter and prescription medicines only as told by your health care provider. These include  supplements. Eat a healthy diet and maintain a healthy weight. A healthy diet includes low-fat dairy products, low-fat (lean) meats, and fiber from whole grains, beans, and lots of fruits and vegetables. Stay current with your vaccines. Schedule regular health, dental, and eye exams. Summary Having a healthy lifestyle and getting preventive care can help to protect your health and wellness after age 79. Screening and testing are the best way to find a health problem early and help you avoid having a fall. Early diagnosis and treatment give you the best chance for managing medical conditions that are more common for people who are older than age 79. Falls are a major cause of broken bones and head injuries in people who are older than age 79. Take precautions to prevent a fall at home. Work with your health care provider to learn what changes you can make to improve your health and wellness and to prevent falls. This information is not intended to replace advice given to you by your health care provider. Make sure you discuss any questions you have with your health care provider. Document Revised: 03/02/2021 Document Reviewed: 03/02/2021 Elsevier Patient Education  2023 [employer]  

## 2022-07-27 NOTE — Progress Notes (Signed)
Subjective:    Patient ID: Allison Parker, female    DOB: 1941/04/25, 81 y.o.   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 PT states this is stable at this time.  Pt is followed by Ortho annually for arthritis.   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 She  reports her pain is 8 out 10 that comes and goes. She fell in her kitchen a couple weeks ago and twisted it.   Reports she is having urinary frequency and urgency. This was improving when taking vesicare, but insurance would not cover. She has been taking oxybutynin 10 mg without relief.  Hypertension This is a chronic problem. The current episode started more than 1 year ago. The problem has been resolved since onset. The problem is controlled. Associated symptoms include anxiety, malaise/fatigue and peripheral edema. Pertinent negatives include no shortness of breath. Risk factors for coronary artery disease include dyslipidemia, obesity and sedentary lifestyle. The current treatment provides moderate improvement. Hypertensive end-organ damage includes CVA. There is no history of heart failure. Identifiable causes of hypertension include a thyroid problem.  Thyroid Problem Presents for follow-up visit. Symptoms include dry skin and fatigue. Patient reports no anxiety, constipation or depressed mood. The symptoms have been stable. Her past medical history is significant for hyperlipidemia. There is no history of heart failure.  Arthritis Presents for follow-up visit. She complains of pain and stiffness. The symptoms have been stable. Affected locations include the right knee, left knee, left MCP and right MCP (back). Her pain is at a severity of 8/10. Associated symptoms  include fatigue.  Urinary Frequency  This is a chronic problem. The current episode started more than 1 year ago. The problem has been waxing and waning. The patient is experiencing no pain. Associated symptoms include frequency and urgency.  Insomnia Primary symptoms: difficulty falling asleep, frequent awakening, malaise/fatigue.   The current episode started more than one year. The onset quality is gradual. The problem occurs intermittently. PMH includes: depression.   Hyperlipidemia This is a chronic problem. The current episode started more than 1 year ago. The problem is controlled. Recent lipid tests were reviewed and are normal. Exacerbating diseases include obesity. Pertinent negatives include no shortness of breath. Current antihyperlipidemic treatment includes statins. The current treatment provides moderate improvement of lipids. Risk factors for coronary artery disease include dyslipidemia, hypertension, a sedentary lifestyle and post-menopausal.  Anxiety Presents for follow-up visit. Symptoms include excessive worry and insomnia. Patient reports no depressed mood, irritability, nervous/anxious behavior or shortness of breath. Symptoms occur occasionally. The severity of symptoms is moderate.    Depression        This is a chronic problem.  The current episode started more than 1 year ago.   The onset quality is gradual.   The problem occurs intermittently.  Associated symptoms include fatigue and insomnia.  Past medical history includes thyroid problem and anxiety.   Back Pain This is a chronic problem. The current episode started more than 1 year ago. The problem occurs intermittently. The pain is at a severity of 8/10. The pain is moderate.      Review of Systems  Constitutional:  Positive for fatigue and malaise/fatigue. Negative for irritability.  Respiratory:  Negative for shortness of breath.   Gastrointestinal:  Negative for constipation.  Genitourinary:  Positive for  frequency and urgency.  Musculoskeletal:  Positive for arthritis, back pain and stiffness.  Psychiatric/Behavioral:  Positive for depression. The patient has insomnia. The patient is not nervous/anxious.   All other systems reviewed and are negative.      Objective:   Physical Exam Vitals reviewed.  Constitutional:      General: She is not in acute distress.    Appearance: She is well-developed. She is obese.  HENT:     Head: Normocephalic and atraumatic.     Right Ear: Tympanic membrane normal.     Left Ear: Tympanic membrane normal.  Eyes:     Pupils: Pupils are equal, round, and reactive to light.  Neck:     Thyroid: No thyromegaly.  Cardiovascular:     Rate and Rhythm: Normal rate and regular rhythm.     Heart sounds: Normal heart sounds. No murmur heard. Pulmonary:     Effort: Pulmonary effort is normal. No respiratory distress.     Breath sounds: Normal breath sounds. No wheezing.  Abdominal:     General: Bowel sounds are normal. There is no distension.     Palpations: Abdomen is soft.     Tenderness: There is no abdominal tenderness.  Musculoskeletal:        General: No tenderness.     Cervical back: Normal range of motion and neck supple.     Left lower leg: Edema (trace) present.     Comments: Left sided weakness  Skin:    General: Skin is warm and dry.  Neurological:     Mental Status: She is alert and oriented to person, place, and time.     Cranial Nerves: No cranial nerve deficit.     Motor: Weakness (left sided weakness, using cane to walk) present.     Gait: Gait abnormal.     Deep Tendon Reflexes: Reflexes are normal and symmetric.  Psychiatric:        Behavior: Behavior normal.        Thought Content: Thought content normal.        Judgment: Judgment normal.      BP 137/86   Pulse 83   Temp 97.8 F (36.6 C) (Temporal)   Ht '5\' 5"'$  (1.651 m)   Wt 195 lb 9.6 oz (88.7 kg)   SpO2 92%   BMI 32.55 kg/m      Assessment & Plan:  Allison Parker  comes in today with chief complaint of Medical Management of Chronic Issues   Diagnosis and orders addressed:  1. Pain medication agreement signed - HYDROcodone-acetaminophen (NORCO) 10-325 MG tablet; Take 1 tablet by mouth every 8 (eight) hours as needed.  Dispense: 90 tablet; Refill: 0 - HYDROcodone-acetaminophen (NORCO) 10-325 MG tablet; Take 1 tablet by mouth every 8 (eight) hours as needed.  Dispense: 90 tablet; Refill: 0 - HYDROcodone-acetaminophen (NORCO) 10-325 MG tablet; Take 1 tablet by mouth every 8 (eight) hours as needed.  Dispense: 90 tablet; Refill: 0  2. Uncomplicated opioid dependence (HCC) - HYDROcodone-acetaminophen (NORCO) 10-325 MG tablet; Take 1 tablet by mouth every 8 (eight) hours as needed.  Dispense: 90 tablet; Refill: 0 - HYDROcodone-acetaminophen (NORCO) 10-325 MG tablet; Take 1 tablet by mouth every 8 (eight) hours as needed.  Dispense: 90 tablet; Refill: 0 - HYDROcodone-acetaminophen (NORCO) 10-325 MG tablet; Take 1 tablet by mouth every 8 (eight) hours as needed.  Dispense: 90 tablet;  Refill: 0  3. Chronic bilateral low back pain with left-sided sciatica - HYDROcodone-acetaminophen (NORCO) 10-325 MG tablet; Take 1 tablet by mouth every 8 (eight) hours as needed.  Dispense: 90 tablet; Refill: 0 - HYDROcodone-acetaminophen (NORCO) 10-325 MG tablet; Take 1 tablet by mouth every 8 (eight) hours as needed.  Dispense: 90 tablet; Refill: 0 - HYDROcodone-acetaminophen (NORCO) 10-325 MG tablet; Take 1 tablet by mouth every 8 (eight) hours as needed.  Dispense: 90 tablet; Refill: 0 - meloxicam (MOBIC) 7.5 MG tablet; Take 1 tablet (7.5 mg total) by mouth daily.  Dispense: 90 tablet; Refill: 1  4. Essential hypertension, benign - amLODipine (NORVASC) 5 MG tablet; Take 1 tablet (5 mg total) by mouth daily.  Dispense: 90 tablet; Refill: 1  5. Hyperlipidemia - atorvastatin (LIPITOR) 20 MG tablet; Take 1 tablet (20 mg total) by mouth daily.  Dispense: 90 tablet; Refill:  1  6. GAD (generalized anxiety disorder) - citalopram (CELEXA) 40 MG tablet; Take 1 tablet (40 mg total) by mouth daily.  Dispense: 90 tablet; Refill: 1  7. Moderate episode of recurrent major depressive disorder (HCC) - citalopram (CELEXA) 40 MG tablet; Take 1 tablet (40 mg total) by mouth daily.  Dispense: 90 tablet; Refill: 1  8. Thalamic pain syndrome - meloxicam (MOBIC) 7.5 MG tablet; Take 1 tablet (7.5 mg total) by mouth daily.  Dispense: 90 tablet; Refill: 1  9. Overactive bladder Will increase Oxybutynin to 15 mg from 10 mg If not improvement will try vesicare - oxybutynin (DITROPAN XL) 15 MG 24 hr tablet; Take 1 tablet (15 mg total) by mouth at bedtime.  Dispense: 90 tablet; Refill: 1  10. Hypothyroidism, unspecified type  11. Osteoarthritis of right knee, unspecified osteoarthritis type  12. Arthritis of right knee  13. Late effect of cerebrovascular accident  70. Insomnia, unspecified type  15. Vitamin D deficiency  16. Obesity (BMI 30-39.9)   Labs pending Patient reviewed in [place] controlled database, no flags noted. Contract and drug screen are up to date.  Health Maintenance reviewed Diet and exercise encouraged  Follow up plan: 3 months   Evelina Dun, FNP

## 2022-10-28 ENCOUNTER — Encounter: Payer: Self-pay | Admitting: Family

## 2022-10-28 ENCOUNTER — Ambulatory Visit (INDEPENDENT_AMBULATORY_CARE_PROVIDER_SITE_OTHER): Payer: Medicare Other | Admitting: Family

## 2022-10-28 VITALS — BP 131/66 | HR 81 | Temp 97.6°F | Ht 65.0 in | Wt 196.0 lb

## 2022-10-28 DIAGNOSIS — G8929 Other chronic pain: Secondary | ICD-10-CM

## 2022-10-28 DIAGNOSIS — M5442 Lumbago with sciatica, left side: Secondary | ICD-10-CM | POA: Diagnosis not present

## 2022-10-28 DIAGNOSIS — F331 Major depressive disorder, recurrent, moderate: Secondary | ICD-10-CM | POA: Diagnosis not present

## 2022-10-28 DIAGNOSIS — E039 Hypothyroidism, unspecified: Secondary | ICD-10-CM

## 2022-10-28 DIAGNOSIS — Z0289 Encounter for other administrative examinations: Secondary | ICD-10-CM

## 2022-10-28 DIAGNOSIS — F411 Generalized anxiety disorder: Secondary | ICD-10-CM

## 2022-10-28 DIAGNOSIS — E669 Obesity, unspecified: Secondary | ICD-10-CM

## 2022-10-28 DIAGNOSIS — I10 Essential (primary) hypertension: Secondary | ICD-10-CM

## 2022-10-28 DIAGNOSIS — I6930 Unspecified sequelae of cerebral infarction: Secondary | ICD-10-CM

## 2022-10-28 DIAGNOSIS — E782 Mixed hyperlipidemia: Secondary | ICD-10-CM

## 2022-10-28 DIAGNOSIS — G4700 Insomnia, unspecified: Secondary | ICD-10-CM

## 2022-10-28 DIAGNOSIS — N3281 Overactive bladder: Secondary | ICD-10-CM

## 2022-10-28 DIAGNOSIS — F1120 Opioid dependence, uncomplicated: Secondary | ICD-10-CM | POA: Diagnosis not present

## 2022-10-28 DIAGNOSIS — M1711 Unilateral primary osteoarthritis, right knee: Secondary | ICD-10-CM

## 2022-10-28 DIAGNOSIS — G890 Central pain syndrome: Secondary | ICD-10-CM

## 2022-10-28 LAB — CBC WITH DIFFERENTIAL/PLATELET
Basophils Absolute: 0.1 10*3/uL (ref 0.0–0.2)
Basos: 1 %
EOS (ABSOLUTE): 0.2 10*3/uL (ref 0.0–0.4)
Eos: 3 %
Hematocrit: 39 % (ref 34.0–46.6)
Hemoglobin: 13.1 g/dL (ref 11.1–15.9)
Immature Grans (Abs): 0 10*3/uL (ref 0.0–0.1)
Immature Granulocytes: 0 %
Lymphocytes Absolute: 1.4 10*3/uL (ref 0.7–3.1)
Lymphs: 20 %
MCH: 31 pg (ref 26.6–33.0)
MCHC: 33.6 g/dL (ref 31.5–35.7)
MCV: 92 fL (ref 79–97)
Monocytes Absolute: 0.6 10*3/uL (ref 0.1–0.9)
Monocytes: 9 %
Neutrophils Absolute: 4.7 10*3/uL (ref 1.4–7.0)
Neutrophils: 67 %
Platelets: 232 10*3/uL (ref 150–450)
RBC: 4.23 x10E6/uL (ref 3.77–5.28)
RDW: 12.5 % (ref 11.7–15.4)
WBC: 7 10*3/uL (ref 3.4–10.8)

## 2022-10-28 LAB — CMP14+EGFR
ALT: 15 IU/L (ref 0–32)
AST: 21 IU/L (ref 0–40)
Albumin/Globulin Ratio: 1.8 (ref 1.2–2.2)
Albumin: 4.3 g/dL (ref 3.8–4.8)
Alkaline Phosphatase: 109 IU/L (ref 44–121)
BUN/Creatinine Ratio: 26 (ref 12–28)
BUN: 24 mg/dL (ref 8–27)
Bilirubin Total: 0.4 mg/dL (ref 0.0–1.2)
CO2: 24 mmol/L (ref 20–29)
Calcium: 9.7 mg/dL (ref 8.7–10.3)
Chloride: 100 mmol/L (ref 96–106)
Creatinine, Ser: 0.91 mg/dL (ref 0.57–1.00)
Globulin, Total: 2.4 g/dL (ref 1.5–4.5)
Glucose: 92 mg/dL (ref 70–99)
Potassium: 5.1 mmol/L (ref 3.5–5.2)
Sodium: 137 mmol/L (ref 134–144)
Total Protein: 6.7 g/dL (ref 6.0–8.5)
eGFR: 64 mL/min/{1.73_m2} (ref >59)

## 2022-10-28 MED ORDER — HYDROCODONE-ACETAMINOPHEN 7.5-325 MG PO TABS: 1.0000 | ORAL_TABLET | Freq: Four times a day (QID) | ORAL | 0 refills | Status: DC | PRN

## 2022-10-28 NOTE — Progress Notes (Signed)
Subjective:    Patient ID: Allison Parker, female    DOB: 1941-08-23, 82 y.o.   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 PT states this is stable at this time.  Pt is followed by Ortho annually for arthritis.   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 She  reports her pain is 6 out 10 that comes and goes.   Reports she is having urinary frequency and urgency. This was improving when taking vesicare, but insurance would not cover. She has been taking oxybutynin 10 mg without relief.  Hypertension This is a chronic problem. The current episode started more than 1 year ago. The problem has been resolved since onset. The problem is controlled. Associated symptoms include anxiety. Pertinent negatives include no malaise/fatigue, peripheral edema or shortness of breath. Risk factors for coronary artery disease include dyslipidemia, obesity and sedentary lifestyle. The current treatment provides moderate improvement. Identifiable causes of hypertension include a thyroid problem.  Thyroid Problem Presents for follow-up visit. Symptoms include anxiety and dry skin. Patient reports no constipation, depressed mood or hoarse voice. The symptoms have been stable. Her past medical history is significant for hyperlipidemia.  Arthritis Presents for follow-up visit. She complains of pain and stiffness. The symptoms have been stable. Affected locations include the left knee, right knee, left MCP and right MCP. Her pain is at a severity of 6/10.  Urinary Frequency  This is a chronic problem. The current episode started more than 1 year ago. The problem occurs intermittently. The problem has been waxing  and waning. The patient is experiencing no pain. Associated symptoms include frequency and urgency. Pertinent negatives include no hematuria. Treatments tried: oxybutynin.  Insomnia Primary symptoms: difficulty falling asleep, frequent awakening, no malaise/fatigue.   The current episode started more than one year. The onset quality is gradual. The problem occurs intermittently. The treatment provided mild relief. PMH includes: depression.   Hyperlipidemia This is a chronic problem. The current episode started more than 1 year ago. Exacerbating diseases include obesity. Pertinent negatives include no shortness of breath. Current antihyperlipidemic treatment includes statins. The current treatment provides moderate improvement of lipids. Risk factors for coronary artery disease include dyslipidemia, hypertension, a sedentary lifestyle and post-menopausal.  Anxiety Presents for follow-up visit. Symptoms include excessive worry, insomnia, nervous/anxious behavior and restlessness. Patient reports no depressed mood or shortness of breath. Symptoms occur occasionally. The severity of symptoms is mild.    Depression        This is a chronic problem.  The current episode started more than 1 year ago.   Associated symptoms include insomnia, restlessness and sad.  Associated symptoms include no helplessness and no hopelessness.  Past treatments include SSRIs - Selective serotonin reuptake inhibitors.  Past medical history includes thyroid problem and anxiety.   Back Pain This is a chronic problem. The current episode started more than 1 year ago. The problem occurs intermittently. The pain is present in the lumbar spine. The quality of the pain is described as aching. The pain is at a severity of 6/10. The pain is moderate. She has tried analgesics for the symptoms. The treatment provided mild relief.  Current opioids rx- Norco 10-325 mg  # meds rx- 90 Effectiveness of current meds-stable, but having  hard time getting medication because of national back order Adverse reactions from pain meds-n/a Morphine equivalent- 30   Pill count performed-No Last drug screen - 04/22/22 ( high risk q61m moderate risk q692mlow risk yearly ) Urine drug screen today- Yes [place]- [place]             If yes were their any concerning findings? - none   Pain contract signed on: 04/28/22    Review of Systems  Constitutional:  Negative for malaise/fatigue.  HENT:  Negative for hoarse voice.   Respiratory:  Negative for shortness of breath.   Gastrointestinal:  Negative for constipation.  Genitourinary:  Positive for frequency and urgency. Negative for hematuria.  Musculoskeletal:  Positive for arthritis, back pain and stiffness.  Psychiatric/Behavioral:  Positive for depression. The patient is nervous/anxious and has insomnia.   All other systems reviewed and are negative.      Objective:   Physical Exam Vitals reviewed.  Constitutional:      General: She is not in acute distress.    Appearance: She is well-developed.  HENT:     Head: Normocephalic and atraumatic.     Right Ear: Tympanic membrane normal.     Left Ear: Tympanic membrane normal.  Eyes:     Pupils: Pupils are equal, round, and reactive to light.  Neck:     Thyroid: No thyromegaly.  Cardiovascular:     Rate and Rhythm: Normal rate and regular rhythm.     Heart sounds: Normal heart sounds. No murmur heard. Pulmonary:     Effort: Pulmonary effort is normal. No respiratory distress.     Breath sounds: Normal breath sounds. No wheezing.  Abdominal:     General: Bowel sounds are normal. There is no distension.     Palpations: Abdomen is soft.     Tenderness: There is no abdominal tenderness.  Musculoskeletal:        General: Tenderness present. Normal range of motion.     Cervical back: Normal range of motion and neck supple.     Right lower leg: Edema (2+) present.     Comments: Right sided weakness  Skin:     General: Skin is warm and dry.  Neurological:     Mental Status: She is alert and oriented to person, place, and time.     Cranial Nerves: No cranial nerve deficit.     Deep Tendon Reflexes: Reflexes are normal and symmetric.  Psychiatric:        Behavior: Behavior normal.        Thought Content: Thought content normal.        Judgment: Judgment normal.       BP 131/66   Pulse 81   Temp 97.6 F (36.4 C) (Temporal)   Ht _0  (1.651 m)   Wt 196 lb (88.9 kg)   SpO2 97%   BMI 32.62 kg/m      Assessment & Plan:  JuVicki Chaffineremer comes in today with chief complaint of Medical Management of Chronic Issues (having trouble getting pain meds )   Diagnosis and orders addressed:  1. Pain medication agreement signed - CMP14+EGFR - CBC with Differential/Platelet - HYDROcodone-acetaminophen (NORCO) 7.5-325 MG tablet; Take 1 tablet by mouth every 6 (six) hours as needed for moderate pain.  Dispense: 120 tablet; Refill: 0 - HYDROcodone-acetaminophen (NORCO) 7.5-325 MG tablet; Take 1 tablet by  mouth every 6 (six) hours as needed for moderate pain.  Dispense: 120 tablet; Refill: 0 - HYDROcodone-acetaminophen (NORCO) 7.5-325 MG tablet; Take 1 tablet by mouth every 6 (six) hours as needed for moderate pain.  Dispense: 120 tablet; Refill: 0  2. Uncomplicated opioid dependence (HCC) - CMP14+EGFR - CBC with Differential/Platelet - HYDROcodone-acetaminophen (NORCO) 7.5-325 MG tablet; Take 1 tablet by mouth every 6 (six) hours as needed for moderate pain.  Dispense: 120 tablet; Refill: 0 - HYDROcodone-acetaminophen (NORCO) 7.5-325 MG tablet; Take 1 tablet by mouth every 6 (six) hours as needed for moderate pain.  Dispense: 120 tablet; Refill: 0 - HYDROcodone-acetaminophen (NORCO) 7.5-325 MG tablet; Take 1 tablet by mouth every 6 (six) hours as needed for moderate pain.  Dispense: 120 tablet; Refill: 0  3. Chronic bilateral low back pain with left-sided sciatica - CMP14+EGFR - CBC with  Differential/Platelet - HYDROcodone-acetaminophen (NORCO) 7.5-325 MG tablet; Take 1 tablet by mouth every 6 (six) hours as needed for moderate pain.  Dispense: 120 tablet; Refill: 0 - HYDROcodone-acetaminophen (NORCO) 7.5-325 MG tablet; Take 1 tablet by mouth every 6 (six) hours as needed for moderate pain.  Dispense: 120 tablet; Refill: 0 - HYDROcodone-acetaminophen (NORCO) 7.5-325 MG tablet; Take 1 tablet by mouth every 6 (six) hours as needed for moderate pain.  Dispense: 120 tablet; Refill: 0  4. Arthritis of right knee - CMP14+EGFR - CBC with Differential/Platelet - HYDROcodone-acetaminophen (NORCO) 7.5-325 MG tablet; Take 1 tablet by mouth every 6 (six) hours as needed for moderate pain.  Dispense: 120 tablet; Refill: 0 - HYDROcodone-acetaminophen (NORCO) 7.5-325 MG tablet; Take 1 tablet by mouth every 6 (six) hours as needed for moderate pain.  Dispense: 120 tablet; Refill: 0 - HYDROcodone-acetaminophen (NORCO) 7.5-325 MG tablet; Take 1 tablet by mouth every 6 (six) hours as needed for moderate pain.  Dispense: 120 tablet; Refill: 0  5. Moderate episode of recurrent major depressive disorder (HCC) - CMP14+EGFR - CBC with Differential/Platelet  6. Essential hypertension, benign - CMP14+EGFR - CBC with Differential/Platelet  7. GAD (generalized anxiety disorder) - CMP14+EGFR - CBC with Differential/Platelet  8. Mixed hyperlipidemia - CMP14+EGFR - CBC with Differential/Platelet  9. Hypothyroidism, unspecified type - CMP14+EGFR - CBC with Differential/Platelet  10. Insomnia, unspecified type - CMP14+EGFR - CBC with Differential/Platelet  11. Late effect of cerebrovascular accident - CMP14+EGFR - CBC with Differential/Platelet  12. Obesity (BMI 30-39.9) - CMP14+EGFR - CBC with Differential/Platelet  13. Osteoarthritis of right knee, unspecified osteoarthritis type - CMP14+EGFR - CBC with Differential/Platelet - HYDROcodone-acetaminophen (NORCO) 7.5-325 MG tablet; Take  1 tablet by mouth every 6 (six) hours as needed for moderate pain.  Dispense: 120 tablet; Refill: 0 - HYDROcodone-acetaminophen (NORCO) 7.5-325 MG tablet; Take 1 tablet by mouth every 6 (six) hours as needed for moderate pain.  Dispense: 120 tablet; Refill: 0 - HYDROcodone-acetaminophen (NORCO) 7.5-325 MG tablet; Take 1 tablet by mouth every 6 (six) hours as needed for moderate pain.  Dispense: 120 tablet; Refill: 0  14. Overactive bladder - CMP14+EGFR - CBC with Differential/Platelet  15. Thalamic pain syndrome - CMP14+EGFR - CBC with Differential/Platelet - HYDROcodone-acetaminophen (NORCO) 7.5-325 MG tablet; Take 1 tablet by mouth every 6 (six) hours as needed for moderate pain.  Dispense: 120 tablet; Refill: 0 - HYDROcodone-acetaminophen (NORCO) 7.5-325 MG tablet; Take 1 tablet by mouth every 6 (six) hours as needed for moderate pain.  Dispense: 120 tablet; Refill: 0 - HYDROcodone-acetaminophen (NORCO) 7.5-325 MG tablet; Take 1 tablet by mouth every 6 (six) hours as needed for moderate pain.  Dispense: 120 tablet; Refill: 0   Labs pending Will changed Norco 10-325 mg #90 to Norco 7.5-325 mg #120 because of national back order Health Maintenance reviewed Diet and exercise encouraged  Follow up plan: 3 months    Evelina Dun, FNP

## 2022-10-28 NOTE — Patient Instructions (Signed)
Health Maintenance After Age 80 After age 80, you are at a higher risk for certain long-term diseases and infections as well as injuries from falls. Falls are a major cause of broken bones and head injuries in people who are older than age 80. Getting regular preventive care can help to keep you healthy and well. Preventive care includes getting regular testing and making lifestyle changes as recommended by your health care provider. Talk with your health care provider about: Which screenings and tests you should have. A screening is a test that checks for a disease when you have no symptoms. A diet and exercise plan that is right for you. What should I know about screenings and tests to prevent falls? Screening and testing are the best ways to find a health problem early. Early diagnosis and treatment give you the best chance of managing medical conditions that are common after age 80. Certain conditions and lifestyle choices may make you more likely to have a fall. Your health care provider may recommend: Regular vision checks. Poor vision and conditions such as cataracts can make you more likely to have a fall. If you wear glasses, make sure to get your prescription updated if your vision changes. Medicine review. Work with your health care provider to regularly review all of the medicines you are taking, including over-the-counter medicines. Ask your health care provider about any side effects that may make you more likely to have a fall. Tell your health care provider if any medicines that you take make you feel dizzy or sleepy. Strength and balance checks. Your health care provider may recommend certain tests to check your strength and balance while standing, walking, or changing positions. Foot health exam. Foot pain and numbness, as well as not wearing proper footwear, can make you more likely to have a fall. Screenings, including: Osteoporosis screening. Osteoporosis is a condition that causes  the bones to get weaker and break more easily. Blood pressure screening. Blood pressure changes and medicines to control blood pressure can make you feel dizzy. Depression screening. You may be more likely to have a fall if you have a fear of falling, feel depressed, or feel unable to do activities that you used to do. Alcohol use screening. Using too much alcohol can affect your balance and may make you more likely to have a fall. Follow these instructions at home: Lifestyle Do not drink alcohol if: Your health care provider tells you not to drink. If you drink alcohol: Limit how much you have to: 0-1 drink a day for women. 0-2 drinks a day for men. Know how much alcohol is in your drink. In the [place] one drink equals one 12 oz bottle of beer (355 mL), one 5 oz glass of wine (148 mL), or one 1 oz glass of hard liquor (44 mL). Do not use any products that contain nicotine or tobacco. These products include cigarettes, chewing tobacco, and vaping devices, such as e-cigarettes. If you need help quitting, ask your health care provider. Activity  Follow a regular exercise program to stay fit. This will help you maintain your balance. Ask your health care provider what types of exercise are appropriate for you. If you need a cane or walker, use it as recommended by your health care provider. Wear supportive shoes that have nonskid soles. Safety  Remove any tripping hazards, such as rugs, cords, and clutter. Install safety equipment such as grab bars in bathrooms and safety rails on stairs. Keep rooms and walkways   well-lit. General instructions Talk with your health care provider about your risks for falling. Tell your health care provider if: You fall. Be sure to tell your health care provider about all falls, even ones that seem minor. You feel dizzy, tiredness (fatigue), or off-balance. Take over-the-counter and prescription medicines only as told by your health care provider. These include  supplements. Eat a healthy diet and maintain a healthy weight. A healthy diet includes low-fat dairy products, low-fat (lean) meats, and fiber from whole grains, beans, and lots of fruits and vegetables. Stay current with your vaccines. Schedule regular health, dental, and eye exams. Summary Having a healthy lifestyle and getting preventive care can help to protect your health and wellness after age 80. Screening and testing are the best way to find a health problem early and help you avoid having a fall. Early diagnosis and treatment give you the best chance for managing medical conditions that are more common for people who are older than age 80. Falls are a major cause of broken bones and head injuries in people who are older than age 80. Take precautions to prevent a fall at home. Work with your health care provider to learn what changes you can make to improve your health and wellness and to prevent falls. This information is not intended to replace advice given to you by your health care provider. Make sure you discuss any questions you have with your health care provider. Document Revised: 03/02/2021 Document Reviewed: 03/02/2021 Elsevier Patient Education  2023 [employer]  

## 2022-11-04 ENCOUNTER — Encounter: Payer: Self-pay | Admitting: *Deleted

## 2022-11-25 ENCOUNTER — Other Ambulatory Visit: Payer: Self-pay | Admitting: Family

## 2022-11-25 DIAGNOSIS — G890 Central pain syndrome: Secondary | ICD-10-CM

## 2022-11-25 DIAGNOSIS — G8929 Other chronic pain: Secondary | ICD-10-CM

## 2022-11-25 DIAGNOSIS — N3281 Overactive bladder: Secondary | ICD-10-CM

## 2022-12-02 ENCOUNTER — Other Ambulatory Visit: Payer: Self-pay | Admitting: Family

## 2022-12-02 DIAGNOSIS — I10 Essential (primary) hypertension: Secondary | ICD-10-CM

## 2022-12-02 DIAGNOSIS — B372 Candidiasis of skin and nail: Secondary | ICD-10-CM

## 2023-01-25 ENCOUNTER — Encounter: Payer: Self-pay | Admitting: Family

## 2023-01-25 ENCOUNTER — Ambulatory Visit (INDEPENDENT_AMBULATORY_CARE_PROVIDER_SITE_OTHER): Payer: Medicare Other | Admitting: Family

## 2023-01-25 DIAGNOSIS — F331 Major depressive disorder, recurrent, moderate: Secondary | ICD-10-CM | POA: Diagnosis not present

## 2023-01-25 DIAGNOSIS — M1711 Unilateral primary osteoarthritis, right knee: Secondary | ICD-10-CM

## 2023-01-25 DIAGNOSIS — E782 Mixed hyperlipidemia: Secondary | ICD-10-CM

## 2023-01-25 DIAGNOSIS — I10 Essential (primary) hypertension: Secondary | ICD-10-CM

## 2023-01-25 DIAGNOSIS — Z0289 Encounter for other administrative examinations: Secondary | ICD-10-CM

## 2023-01-25 DIAGNOSIS — M5442 Lumbago with sciatica, left side: Secondary | ICD-10-CM

## 2023-01-25 DIAGNOSIS — F411 Generalized anxiety disorder: Secondary | ICD-10-CM

## 2023-01-25 DIAGNOSIS — N3281 Overactive bladder: Secondary | ICD-10-CM

## 2023-01-25 DIAGNOSIS — G8929 Other chronic pain: Secondary | ICD-10-CM

## 2023-01-25 DIAGNOSIS — E039 Hypothyroidism, unspecified: Secondary | ICD-10-CM

## 2023-01-25 DIAGNOSIS — G890 Central pain syndrome: Secondary | ICD-10-CM

## 2023-01-25 DIAGNOSIS — F1120 Opioid dependence, uncomplicated: Secondary | ICD-10-CM

## 2023-01-25 MED ORDER — GABAPENTIN 100 MG PO CAPS
ORAL_CAPSULE | ORAL | 2 refills | Status: DC
Start: 2023-01-25 — End: 2023-05-02

## 2023-01-25 MED ORDER — HYDROCODONE-ACETAMINOPHEN 7.5-325 MG PO TABS
1.0000 | ORAL_TABLET | Freq: Four times a day (QID) | ORAL | 0 refills | Status: DC | PRN
Start: 2023-03-25 — End: 2023-05-02

## 2023-01-25 MED ORDER — LEVOTHYROXINE SODIUM 25 MCG PO TABS
25.0000 ug | ORAL_TABLET | Freq: Every day | ORAL | 3 refills | Status: DC
Start: 2023-01-25 — End: 2024-02-01

## 2023-01-25 MED ORDER — AMLODIPINE BESYLATE 5 MG PO TABS: 5.0000 mg | ORAL_TABLET | Freq: Every day | ORAL | 0 refills | Status: DC

## 2023-01-25 MED ORDER — HYDROCODONE-ACETAMINOPHEN 7.5-325 MG PO TABS
1.0000 | ORAL_TABLET | Freq: Four times a day (QID) | ORAL | 0 refills | Status: DC | PRN
Start: 2023-01-25 — End: 2023-05-02

## 2023-01-25 MED ORDER — ATORVASTATIN CALCIUM 20 MG PO TABS
20.0000 mg | ORAL_TABLET | Freq: Every day | ORAL | 1 refills | Status: DC
Start: 2023-01-25 — End: 2023-10-12

## 2023-01-25 MED ORDER — SOLIFENACIN SUCCINATE 10 MG PO TABS
10.0000 mg | ORAL_TABLET | Freq: Every day | ORAL | 1 refills | Status: DC
Start: 2023-01-25 — End: 2023-11-04

## 2023-01-25 MED ORDER — CITALOPRAM HYDROBROMIDE 40 MG PO TABS: 40.0000 mg | ORAL_TABLET | Freq: Every day | ORAL | 1 refills | Status: DC

## 2023-01-25 MED ORDER — HYDROCODONE-ACETAMINOPHEN 7.5-325 MG PO TABS
1.0000 | ORAL_TABLET | Freq: Four times a day (QID) | ORAL | 0 refills | Status: DC | PRN
Start: 2023-02-23 — End: 2023-05-02

## 2023-01-25 NOTE — Progress Notes (Signed)
Subjective:    Patient ID: Allison Parker, female    DOB: 02-23-1941, 82 y.o.   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 PT states this is stable at this time.  Pt is followed by Ortho annually for arthritis.   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 She  reports her pain is 5 out 10 that comes and goes.    Reports she is having urinary frequency and urgency. This was improving when taking vesicare, but insurance would not cover. She has been taking oxybutynin 15 mg with relief, but having dry mouth.  Hypertension This is a chronic problem. The current episode started more than 1 year ago. The problem has been resolved since onset. Associated symptoms include anxiety, malaise/fatigue and peripheral edema. Pertinent negatives include no shortness of breath. Risk factors for coronary artery disease include dyslipidemia and sedentary lifestyle. The current treatment provides moderate improvement. Identifiable causes of hypertension include a thyroid problem.  Thyroid Problem Presents for follow-up visit. Symptoms include depressed mood and dry skin. Patient reports no anxiety or fatigue. The symptoms have been stable. Her past medical history is significant for hyperlipidemia.  Arthritis Presents for follow-up visit. She complains of pain and stiffness. Affected locations include the left hip, right hip, right knee, left knee, right MCP, left MCP, right shoulder and left shoulder. Her pain is at a severity of 7/10. Pertinent negatives include no fatigue.  Urinary Frequency  This is a chronic problem. The current episode started more than 1 year ago. The problem occurs intermittently. The problem has been waxing and waning.  The patient is experiencing no pain. Associated symptoms include frequency. The treatment provided mild relief.  Back Pain This is a chronic problem. The current episode started more than 1 year ago. The problem occurs intermittently. The problem has been waxing and waning since onset. The pain is present in the lumbar spine. The quality of the pain is described as aching. The pain is at a severity of 6/10. The pain is moderate. The treatment provided mild relief.  Depression        This is a chronic problem.  The current episode started more than 1 year ago.   The onset quality is gradual.   The problem occurs intermittently.  Associated symptoms include helplessness, insomnia, restlessness and sad.  Associated symptoms include no fatigue and no hopelessness.  Past medical history includes thyroid problem and anxiety.   Anxiety Presents for follow-up visit. Symptoms include depressed mood, excessive worry, insomnia and restlessness. Patient reports no nervous/anxious behavior or shortness of breath. Symptoms occur occasionally.    Hyperlipidemia This is a chronic problem. The current episode started more than 1 year ago. The problem is controlled. Recent lipid tests were reviewed and are normal. Pertinent negatives include no shortness of breath. Current antihyperlipidemic treatment includes statins. The current treatment provides moderate improvement of lipids. Risk factors for coronary artery disease include dyslipidemia, hypertension and a sedentary lifestyle.  Insomnia Primary symptoms: difficulty falling asleep, frequent awakening, malaise/fatigue.   The current episode started more than one year. The onset quality is gradual. The problem occurs intermittently. The treatment provided moderate relief. PMH includes: depression.  Review of Systems  Constitutional:  Positive for malaise/fatigue. Negative for fatigue.  Respiratory:  Negative for shortness of breath.   Genitourinary:   Positive for frequency.  Musculoskeletal:  Positive for arthritis, back pain and stiffness.  Psychiatric/Behavioral:  Positive for depression. The patient has insomnia. The patient is not nervous/anxious.   All other systems reviewed and are negative.      Objective:   Physical Exam Vitals reviewed.  Constitutional:      General: She is not in acute distress.    Appearance: She is well-developed. She is obese.  HENT:     Head: Normocephalic and atraumatic.     Right Ear: Tympanic membrane normal.     Left Ear: Tympanic membrane normal.  Eyes:     Pupils: Pupils are equal, round, and reactive to light.  Neck:     Thyroid: No thyromegaly.  Cardiovascular:     Rate and Rhythm: Normal rate and regular rhythm.     Heart sounds: Normal heart sounds. No murmur heard. Pulmonary:     Effort: Pulmonary effort is normal. No respiratory distress.     Breath sounds: Normal breath sounds. No wheezing.  Abdominal:     General: Bowel sounds are normal. There is no distension.     Palpations: Abdomen is soft.     Tenderness: There is no abdominal tenderness.  Musculoskeletal:        General: No tenderness. Normal range of motion.     Cervical back: Normal range of motion and neck supple.  Skin:    General: Skin is warm and dry.  Neurological:     Mental Status: She is alert and oriented to person, place, and time.     Cranial Nerves: No cranial nerve deficit.     Motor: Weakness present.     Gait: Gait abnormal.     Deep Tendon Reflexes: Reflexes are normal and symmetric.     Comments: Right sided weakness  Psychiatric:        Behavior: Behavior normal.        Thought Content: Thought content normal.        Judgment: Judgment normal.        BP 132/65   Pulse 86   Temp 97.8 F (36.6 C) (Temporal)   Ht 5\' 5"  (1.651 m)   Wt 193 lb (87.5 kg)   SpO2 91%   BMI 32.12 kg/m   Assessment & Plan:   Allison Parker comes in today with chief complaint of Medical Management of Chronic  Issues   Diagnosis and orders addressed:  1. Essential hypertension, benign - amLODipine (NORVASC) 5 MG tablet; Take 1 tablet (5 mg total) by mouth daily.  Dispense: 90 tablet; Refill: 0 - CMP14+EGFR  2. Mixed hyperlipidemia - atorvastatin (LIPITOR) 20 MG tablet; Take 1 tablet (20 mg total) by mouth daily.  Dispense: 90 tablet; Refill: 1 - CMP14+EGFR  3. GAD (generalized anxiety disorder) - citalopram (CELEXA) 40 MG tablet; Take 1 tablet (40 mg total) by mouth daily.  Dispense: 90 tablet; Refill: 1 - CMP14+EGFR  4. Moderate episode of recurrent major depressive disorder - citalopram (CELEXA) 40 MG tablet; Take 1 tablet (40 mg total) by mouth daily.  Dispense: 90 tablet; Refill: 1 - CMP14+EGFR  5. Thalamic pain syndrome - HYDROcodone-acetaminophen (NORCO) 7.5-325 MG tablet; Take 1 tablet by mouth every 6 (six) hours as needed for moderate pain.  Dispense: 120 tablet; Refill: 0 - HYDROcodone-acetaminophen (NORCO) 7.5-325 MG tablet; Take 1 tablet by mouth  every 6 (six) hours as needed for moderate pain.  Dispense: 120 tablet; Refill: 0 - HYDROcodone-acetaminophen (NORCO) 7.5-325 MG tablet; Take 1 tablet by mouth every 6 (six) hours as needed for moderate pain.  Dispense: 120 tablet; Refill: 0 - gabapentin (NEURONTIN) 100 MG capsule; Take 100 mg in AM, afternoon, and 600 mg in evening  Dispense: 150 capsule; Refill: 2 - CMP14+EGFR  6. Pain medication agreement signed - HYDROcodone-acetaminophen (NORCO) 7.5-325 MG tablet; Take 1 tablet by mouth every 6 (six) hours as needed for moderate pain.  Dispense: 120 tablet; Refill: 0 - HYDROcodone-acetaminophen (NORCO) 7.5-325 MG tablet; Take 1 tablet by mouth every 6 (six) hours as needed for moderate pain.  Dispense: 120 tablet; Refill: 0 - HYDROcodone-acetaminophen (NORCO) 7.5-325 MG tablet; Take 1 tablet by mouth every 6 (six) hours as needed for moderate pain.  Dispense: 120 tablet; Refill: 0 - CMP14+EGFR  7. Arthritis of right knee -  HYDROcodone-acetaminophen (NORCO) 7.5-325 MG tablet; Take 1 tablet by mouth every 6 (six) hours as needed for moderate pain.  Dispense: 120 tablet; Refill: 0 - HYDROcodone-acetaminophen (NORCO) 7.5-325 MG tablet; Take 1 tablet by mouth every 6 (six) hours as needed for moderate pain.  Dispense: 120 tablet; Refill: 0 - HYDROcodone-acetaminophen (NORCO) 7.5-325 MG tablet; Take 1 tablet by mouth every 6 (six) hours as needed for moderate pain.  Dispense: 120 tablet; Refill: 0 - CMP14+EGFR  8. Uncomplicated opioid dependence - HYDROcodone-acetaminophen (NORCO) 7.5-325 MG tablet; Take 1 tablet by mouth every 6 (six) hours as needed for moderate pain.  Dispense: 120 tablet; Refill: 0 - HYDROcodone-acetaminophen (NORCO) 7.5-325 MG tablet; Take 1 tablet by mouth every 6 (six) hours as needed for moderate pain.  Dispense: 120 tablet; Refill: 0 - HYDROcodone-acetaminophen (NORCO) 7.5-325 MG tablet; Take 1 tablet by mouth every 6 (six) hours as needed for moderate pain.  Dispense: 120 tablet; Refill: 0 - CMP14+EGFR  9. Osteoarthritis of right knee, unspecified osteoarthritis type - HYDROcodone-acetaminophen (NORCO) 7.5-325 MG tablet; Take 1 tablet by mouth every 6 (six) hours as needed for moderate pain.  Dispense: 120 tablet; Refill: 0 - HYDROcodone-acetaminophen (NORCO) 7.5-325 MG tablet; Take 1 tablet by mouth every 6 (six) hours as needed for moderate pain.  Dispense: 120 tablet; Refill: 0 - HYDROcodone-acetaminophen (NORCO) 7.5-325 MG tablet; Take 1 tablet by mouth every 6 (six) hours as needed for moderate pain.  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  Dispense: 120 tablet; Refill: 0 - HYDROcodone-acetaminophen (NORCO) 7.5-325 MG tablet; Take 1 tablet by mouth every 6 (six) hours as needed for moderate pain.   Dispense: 120 tablet; Refill: 0 - HYDROcodone-acetaminophen (NORCO) 7.5-325 MG tablet; Take 1 tablet by mouth every 6 (six) hours as needed for moderate pain.  Dispense: 120 tablet; Refill: 0 - gabapentin (NEURONTIN) 100 MG capsule; Take 100 mg in AM, afternoon, and 600 mg in evening  Dispense: 150 capsule; Refill: 2 - CMP14+EGFR  11. Hypothyroidism, unspecified type - levothyroxine (SYNTHROID) 25 MCG tablet; Take 1 tablet (25 mcg total) by mouth daily.  Dispense: 90 tablet; Refill: 3 - CMP14+EGFR - TSH  12. Overactive bladder Will stop oxybutynin and start Vesicare  - solifenacin (VESICARE) 10 MG tablet; Take 1 tablet (10 mg total) by mouth daily.  Dispense: 90 tablet; Refill: 1 - CMP14+EGFR   Labs pending Have also increased gabapentin to  300 mg in evening Patient reviewed in [place] controlled database, no flags noted. Contract and drug screen are up to date.  Health Maintenance reviewed Diet and exercise encouraged  Follow up plan:   3 months    Evelina Dun, FNP

## 2023-01-25 NOTE — Patient Instructions (Addendum)

## 2023-01-26 LAB — CMP14+EGFR
ALT: 15 IU/L (ref 0–32)
AST: 22 IU/L (ref 0–40)
Albumin/Globulin Ratio: 1.7 (ref 1.2–2.2)
Albumin: 4.3 g/dL (ref 3.8–4.8)
Alkaline Phosphatase: 108 IU/L (ref 44–121)
BUN/Creatinine Ratio: 20 (ref 12–28)
BUN: 19 mg/dL (ref 8–27)
Bilirubin Total: 0.4 mg/dL (ref 0.0–1.2)
CO2: 26 mmol/L (ref 20–29)
Calcium: 9.5 mg/dL (ref 8.7–10.3)
Chloride: 101 mmol/L (ref 96–106)
Creatinine, Ser: 0.93 mg/dL (ref 0.57–1.00)
Globulin, Total: 2.6 g/dL (ref 1.5–4.5)
Glucose: 85 mg/dL (ref 70–99)
Potassium: 4.3 mmol/L (ref 3.5–5.2)
Sodium: 139 mmol/L (ref 134–144)
Total Protein: 6.9 g/dL (ref 6.0–8.5)
eGFR: 63 mL/min/{1.73_m2} (ref >59)

## 2023-01-26 LAB — TSH: TSH: 1.39 u[IU]/mL (ref 0.450–4.500)

## 2023-01-27 ENCOUNTER — Ambulatory Visit: Payer: Medicare Other | Admitting: Family

## 2023-03-02 ENCOUNTER — Telehealth: Payer: Self-pay | Admitting: Family

## 2023-03-02 NOTE — Telephone Encounter (Signed)
Norwood Levo Mancuso scheduled for their annual wellness visit. Appointment made for 06/14/2023.  Thank you,  Judeth Cornfield,  AMB Clinical Support [facility] AW[facility] Program Direct Dial ??[phone]

## 2023-05-02 ENCOUNTER — Ambulatory Visit (INDEPENDENT_AMBULATORY_CARE_PROVIDER_SITE_OTHER): Payer: Medicare Other | Admitting: Family

## 2023-05-02 ENCOUNTER — Encounter: Payer: Self-pay | Admitting: Family

## 2023-05-02 VITALS — BP 146/70 | HR 69 | Temp 97.5°F | Ht 65.0 in | Wt 193.0 lb

## 2023-05-02 DIAGNOSIS — F1120 Opioid dependence, uncomplicated: Secondary | ICD-10-CM | POA: Diagnosis not present

## 2023-05-02 DIAGNOSIS — M5442 Lumbago with sciatica, left side: Secondary | ICD-10-CM

## 2023-05-02 DIAGNOSIS — G8929 Other chronic pain: Secondary | ICD-10-CM

## 2023-05-02 DIAGNOSIS — I6930 Unspecified sequelae of cerebral infarction: Secondary | ICD-10-CM

## 2023-05-02 DIAGNOSIS — M1711 Unilateral primary osteoarthritis, right knee: Secondary | ICD-10-CM | POA: Diagnosis not present

## 2023-05-02 DIAGNOSIS — E782 Mixed hyperlipidemia: Secondary | ICD-10-CM

## 2023-05-02 DIAGNOSIS — E669 Obesity, unspecified: Secondary | ICD-10-CM

## 2023-05-02 DIAGNOSIS — F411 Generalized anxiety disorder: Secondary | ICD-10-CM

## 2023-05-02 DIAGNOSIS — G890 Central pain syndrome: Secondary | ICD-10-CM

## 2023-05-02 DIAGNOSIS — E039 Hypothyroidism, unspecified: Secondary | ICD-10-CM

## 2023-05-02 DIAGNOSIS — G4700 Insomnia, unspecified: Secondary | ICD-10-CM

## 2023-05-02 DIAGNOSIS — N3281 Overactive bladder: Secondary | ICD-10-CM

## 2023-05-02 DIAGNOSIS — F331 Major depressive disorder, recurrent, moderate: Secondary | ICD-10-CM | POA: Diagnosis not present

## 2023-05-02 DIAGNOSIS — I10 Essential (primary) hypertension: Secondary | ICD-10-CM

## 2023-05-02 DIAGNOSIS — Z0289 Encounter for other administrative examinations: Secondary | ICD-10-CM

## 2023-05-02 MED ORDER — GABAPENTIN 100 MG PO CAPS
ORAL_CAPSULE | ORAL | 2 refills | Status: DC
Start: 2023-05-02 — End: 2023-07-18

## 2023-05-02 MED ORDER — HYDROCODONE-ACETAMINOPHEN 7.5-325 MG PO TABS
1.0000 | ORAL_TABLET | Freq: Four times a day (QID) | ORAL | 0 refills | Status: DC | PRN
Start: 2023-06-29 — End: 2023-08-02

## 2023-05-02 MED ORDER — MELOXICAM 7.5 MG PO TABS
7.5000 mg | ORAL_TABLET | Freq: Every day | ORAL | 1 refills | Status: DC
Start: 2023-05-02 — End: 2023-11-02

## 2023-05-02 MED ORDER — TIZANIDINE HCL 2 MG PO CAPS
4.0000 mg | ORAL_CAPSULE | Freq: Three times a day (TID) | ORAL | 0 refills | Status: DC | PRN
Start: 2023-05-02 — End: 2023-08-02

## 2023-05-02 MED ORDER — HYDROCODONE-ACETAMINOPHEN 7.5-325 MG PO TABS
1.0000 | ORAL_TABLET | Freq: Four times a day (QID) | ORAL | 0 refills | Status: DC | PRN
Start: 2023-05-02 — End: 2023-08-02

## 2023-05-02 MED ORDER — HYDROCODONE-ACETAMINOPHEN 7.5-325 MG PO TABS
1.0000 | ORAL_TABLET | Freq: Four times a day (QID) | ORAL | 0 refills | Status: DC | PRN
Start: 2023-05-31 — End: 2023-08-02

## 2023-05-02 NOTE — Progress Notes (Signed)
Subjective:    Patient ID: Allison Parker, female    DOB: 10-Sep-1941, 82 y.o.   MRN: [id number]  Chief Complaint  Patient presents with   Medical Management of Chronic Issues    Muscles hurt in both arms at random.   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 PT states this is stable at this time.  Pt is followed by Ortho annually for arthritis.   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 Her pain has greatly improved.    Reports she is having urinary frequency and urgency. This was improving when taking vesicare, but insurance would not cover. She has been taking oxybutynin 15 mg with relief, but having dry mouth.  Hypertension This is a chronic problem. The current episode started more than 1 year ago. The problem has been waxing and waning since onset. The problem is uncontrolled. Associated symptoms include anxiety, malaise/fatigue and peripheral edema. Pertinent negatives include no shortness of breath. Risk factors for coronary artery disease include dyslipidemia, obesity and sedentary lifestyle. The current treatment provides moderate improvement. Hypertensive end-organ damage includes kidney disease and CAD/MI. Identifiable causes of hypertension include a thyroid problem.  Thyroid Problem Presents for follow-up visit. Symptoms include anxiety, dry skin and fatigue. Patient reports no constipation or diarrhea. The symptoms have been stable. Her past medical history is significant for hyperlipidemia.  Arthritis Presents for follow-up visit. She complains of pain and stiffness. Affected locations include the left MCP, right MCP, left shoulder and right shoulder. Her pain is at a severity of 7/10. Associated symptoms include fatigue. Pertinent negatives include no diarrhea.  Urinary Frequency  This is a chronic  problem. The current episode started more than 1 year ago. The problem occurs intermittently. The pain is at a severity of 0/10. The patient is experiencing no pain. Associated symptoms include frequency. Treatments tried: oxybutin.  Insomnia Primary symptoms: difficulty falling asleep, frequent awakening, malaise/fatigue.   The current episode started more than one year. The onset quality is gradual. The problem occurs intermittently. Past treatments include medication. The treatment provided mild relief. PMH includes: depression.   Hyperlipidemia This is a chronic problem. The current episode started more than 1 year ago. The problem is controlled. Recent lipid tests were reviewed and are normal. Pertinent negatives include no shortness of breath. Current antihyperlipidemic treatment includes statins. The current treatment provides moderate improvement of lipids. Risk factors for coronary artery disease include dyslipidemia, hypertension, a sedentary lifestyle and post-menopausal.  Anxiety Presents for follow-up visit. Symptoms include excessive worry, insomnia, nervous/anxious behavior and restlessness. Patient reports no shortness of breath. Symptoms occur occasionally. The severity of symptoms is mild.    Depression        This is a chronic problem.  The current episode started more than 1 year ago.   Associated symptoms include fatigue, insomnia and restlessness.  Associated symptoms include no helplessness, no hopelessness and not sad.  Past treatments include SSRIs - Selective serotonin reuptake inhibitors.  Compliance with treatment is good.  Past medical history includes thyroid problem and anxiety.   Back Pain This is a chronic problem. The current episode started more than 1 year ago. The problem occurs intermittently. The problem has been waxing and waning since onset. The pain is present in the lumbar spine. The quality of the pain is  described as aching. The pain is at a severity of 8/10.  The pain is moderate. She has tried analgesics for the symptoms. The treatment provided moderate relief.   Current opioids rx- Norco 10-325 mg  # meds rx- 90 Effectiveness of current meds-stable Adverse reactions from pain meds-n/a Morphine equivalent- 30   Pill count performed-No Last drug screen - 05/02/23 ( high risk q59m, moderate risk q48m, low risk yearly ) Urine drug screen today- Yes Was the [facility] reviewed- Yes             If yes were their any concerning findings? - none   Pain contract signed on: 05/02/23   Review of Systems  Constitutional:  Positive for fatigue and malaise/fatigue.  Respiratory:  Negative for shortness of breath.   Gastrointestinal:  Negative for constipation and diarrhea.  Genitourinary:  Positive for frequency.  Musculoskeletal:  Positive for arthritis, back pain and stiffness.  Psychiatric/Behavioral:  Positive for depression. The patient is nervous/anxious and has insomnia.   All other systems reviewed and are negative.      Objective:   Physical Exam Vitals reviewed.  Constitutional:      General: She is not in acute distress.    Appearance: She is well-developed. She is obese.  HENT:     Head: Normocephalic and atraumatic.     Right Ear: Tympanic membrane normal.     Left Ear: Tympanic membrane normal.  Eyes:     Pupils: Pupils are equal, round, and reactive to light.  Neck:     Thyroid: No thyromegaly.  Cardiovascular:     Rate and Rhythm: Normal rate and regular rhythm.     Heart sounds: Normal heart sounds. No murmur heard. Pulmonary:     Effort: Pulmonary effort is normal. No respiratory distress.     Breath sounds: Normal breath sounds. No wheezing.  Abdominal:     General: Bowel sounds are normal. There is no distension.     Palpations: Abdomen is soft.     Tenderness: There is no abdominal tenderness.  Musculoskeletal:        General: No tenderness.     Cervical back: Normal range of motion and neck supple.     Comments:  Right sided weakness,  Skin:    General: Skin is warm and dry.  Neurological:     Mental Status: She is alert and oriented to person, place, and time.     Cranial Nerves: No cranial nerve deficit.     Motor: Weakness present.     Coordination: Coordination abnormal.     Gait: Gait abnormal.     Deep Tendon Reflexes: Reflexes are normal and symmetric.  Psychiatric:        Behavior: Behavior normal.        Thought Content: Thought content normal.        Judgment: Judgment normal.      BP (!) 146/70   Pulse 69   Temp (!) 97.5 F (36.4 C) (Temporal)   Ht 5\' 5"  (1.651 m)   Wt 193 lb (87.5 kg)   SpO2 97%   BMI 32.12 kg/m       Assessment & Plan:   Mishka Veerkamp Zenz comes in today with chief complaint of Medical Management of Chronic Issues (Muscles hurt in both arms at random.)   Diagnosis and orders addressed:  1. Thalamic pain syndrome - gabapentin (NEURONTIN) 100 MG capsule; Take 100 mg in AM, afternoon, and 600 mg in evening  Dispense: 150 capsule;  Refill: 2 - HYDROcodone-acetaminophen (NORCO) 7.5-325 MG tablet; Take 1 tablet by mouth every 6 (six) hours as needed for moderate pain.  Dispense: 120 tablet; Refill: 0 - HYDROcodone-acetaminophen (NORCO) 7.5-325 MG tablet; Take 1 tablet by mouth every 6 (six) hours as needed for moderate pain.  Dispense: 120 tablet; Refill: 0 - HYDROcodone-acetaminophen (NORCO) 7.5-325 MG tablet; Take 1 tablet by mouth every 6 (six) hours as needed for moderate pain.  Dispense: 120 tablet; Refill: 0 - meloxicam (MOBIC) 7.5 MG tablet; Take 1 tablet (7.5 mg total) by mouth daily.  Dispense: 90 tablet; Refill: 1 - tizanidine (ZANAFLEX) 2 MG capsule; Take 2 capsules (4 mg total) by mouth 3 (three) times daily as needed for muscle spasms.  Dispense: 60 capsule; Refill: 0  2. Chronic bilateral low back pain with left-sided sciatica - gabapentin (NEURONTIN) 100 MG capsule; Take 100 mg in AM, afternoon, and 600 mg in evening  Dispense: 150 capsule;  Refill: 2 - HYDROcodone-acetaminophen (NORCO) 7.5-325 MG tablet; Take 1 tablet by mouth every 6 (six) hours as needed for moderate pain.  Dispense: 120 tablet; Refill: 0 - HYDROcodone-acetaminophen (NORCO) 7.5-325 MG tablet; Take 1 tablet by mouth every 6 (six) hours as needed for moderate pain.  Dispense: 120 tablet; Refill: 0 - HYDROcodone-acetaminophen (NORCO) 7.5-325 MG tablet; Take 1 tablet by mouth every 6 (six) hours as needed for moderate pain.  Dispense: 120 tablet; Refill: 0 - meloxicam (MOBIC) 7.5 MG tablet; Take 1 tablet (7.5 mg total) by mouth daily.  Dispense: 90 tablet; Refill: 1 - tizanidine (ZANAFLEX) 2 MG capsule; Take 2 capsules (4 mg total) by mouth 3 (three) times daily as needed for muscle spasms.  Dispense: 60 capsule; Refill: 0 - ToxASSURE Select 13 (MW), Urine  3. Pain medication agreement signed - HYDROcodone-acetaminophen (NORCO) 7.5-325 MG tablet; Take 1 tablet by mouth every 6 (six) hours as needed for moderate pain.  Dispense: 120 tablet; Refill: 0 - HYDROcodone-acetaminophen (NORCO) 7.5-325 MG tablet; Take 1 tablet by mouth every 6 (six) hours as needed for moderate pain.  Dispense: 120 tablet; Refill: 0 - HYDROcodone-acetaminophen (NORCO) 7.5-325 MG tablet; Take 1 tablet by mouth every 6 (six) hours as needed for moderate pain.  Dispense: 120 tablet; Refill: 0 - ToxASSURE Select 13 (MW), Urine  4. Arthritis of right knee - HYDROcodone-acetaminophen (NORCO) 7.5-325 MG tablet; Take 1 tablet by mouth every 6 (six) hours as needed for moderate pain.  Dispense: 120 tablet; Refill: 0 - HYDROcodone-acetaminophen (NORCO) 7.5-325 MG tablet; Take 1 tablet by mouth every 6 (six) hours as needed for moderate pain.  Dispense: 120 tablet; Refill: 0 - HYDROcodone-acetaminophen (NORCO) 7.5-325 MG tablet; Take 1 tablet by mouth every 6 (six) hours as needed for moderate pain.  Dispense: 120 tablet; Refill: 0 - ToxASSURE Select 13 (MW), Urine  5. Uncomplicated opioid dependence  (HCC) - HYDROcodone-acetaminophen (NORCO) 7.5-325 MG tablet; Take 1 tablet by mouth every 6 (six) hours as needed for moderate pain.  Dispense: 120 tablet; Refill: 0 - HYDROcodone-acetaminophen (NORCO) 7.5-325 MG tablet; Take 1 tablet by mouth every 6 (six) hours as needed for moderate pain.  Dispense: 120 tablet; Refill: 0 - HYDROcodone-acetaminophen (NORCO) 7.5-325 MG tablet; Take 1 tablet by mouth every 6 (six) hours as needed for moderate pain.  Dispense: 120 tablet; Refill: 0 - ToxASSURE Select 13 (MW), Urine  6. Osteoarthritis of right knee, unspecified osteoarthritis type - HYDROcodone-acetaminophen (NORCO) 7.5-325 MG tablet; Take 1 tablet by mouth every 6 (six) hours as needed for moderate pain.  Dispense:  120 tablet; Refill: 0 - HYDROcodone-acetaminophen (NORCO) 7.5-325 MG tablet; Take 1 tablet by mouth every 6 (six) hours as needed for moderate pain.  Dispense: 120 tablet; Refill: 0 - HYDROcodone-acetaminophen (NORCO) 7.5-325 MG tablet; Take 1 tablet by mouth every 6 (six) hours as needed for moderate pain.  Dispense: 120 tablet; Refill: 0  7. Moderate episode of recurrent major depressive disorder (HCC)   8. Essential hypertension, benign  9. GAD (generalized anxiety disorder)  10. Mixed hyperlipidemia   11. Hypothyroidism, unspecified type  12. Insomnia, unspecified type  13. Late effect of cerebrovascular accident  13. Obesity (BMI 30-39.9)  15. Overactive bladder   Continue current medications  Patient reviewed in [place] controlled database, no flags noted. Contract and drug screen are up to date.  Health Maintenance reviewed Diet and exercise encouraged  Follow up plan: 3 months    Jannifer Rodney, FNP

## 2023-05-04 LAB — TOXASSURE SELECT 13 (MW), URINE

## 2023-06-14 ENCOUNTER — Ambulatory Visit (INDEPENDENT_AMBULATORY_CARE_PROVIDER_SITE_OTHER): Payer: Medicare Other

## 2023-06-14 VITALS — Ht 62.0 in | Wt 193.0 lb

## 2023-06-14 DIAGNOSIS — Z780 Asymptomatic menopausal state: Secondary | ICD-10-CM | POA: Diagnosis not present

## 2023-06-14 DIAGNOSIS — Z0000 Encounter for general adult medical examination without abnormal findings: Secondary | ICD-10-CM

## 2023-06-14 NOTE — Progress Notes (Signed)
Subjective:   Allison Parker is a 82 y.o. female who presents for Medicare Annual (Subsequent) preventive examination.  Visit Complete: Virtual  I connected with  [name] on 06/14/23 by a audio enabled telemedicine application and verified that I am speaking with the correct person using two identifiers.  Patient Location: Home  Provider Location: Home Office  I discussed the limitations of evaluation and management by telemedicine. The patient expressed understanding and agreed to proceed.  Patient Medicare AWV questionnaire was completed by the patient on 06/14/2023; I have confirmed that all information answered by patient is correct and no changes since this date.  Review of Systems    Vital Signs: Unable to obtain new vitals due to this being a telehealth visit.  Cardiac Risk Factors include: advanced age (>73men, >42 women);dyslipidemia;hypertension     Objective:    Today's Vitals   06/14/23 1029  Weight: 193 lb (87.5 kg)  Height: 5\' 2"  (1.575 m)   Body mass index is 35.3 kg/m.     06/14/2023   10:33 AM 06/10/2022   11:06 AM 01/04/2022    2:13 PM 12/22/2021   11:14 AM 04/16/2021    3:26 PM 05/11/2019    9:51 AM  Advanced Directives  Does Patient Have a Medical Advance Directive? No No No No No No  Would patient like information on creating a medical advance directive? Yes (MAU/Ambulatory/Procedural Areas - Information given) No - Patient declined No - Patient declined  No - Patient declined No - Patient declined    Current Medications (verified) Outpatient Encounter Medications as of 06/14/2023  Medication Sig   amLODipine (NORVASC) 5 MG tablet Take 1 tablet (5 mg total) by mouth daily.   aspirin EC 81 MG tablet Take 1 tablet (81 mg total) by mouth daily.   atorvastatin (LIPITOR) 20 MG tablet Take 1 tablet (20 mg total) by mouth daily.   b complex vitamins capsule Take 1 capsule by mouth daily.   Calcium Citrate-Vitamin D (CALCIUM + D PO) Take 1 tablet by  mouth daily.   citalopram (CELEXA) 40 MG tablet Take 1 tablet (40 mg total) by mouth daily.   doxylamine, Sleep, (UNISOM) 25 MG tablet Take 25 mg by mouth at bedtime.   gabapentin (NEURONTIN) 100 MG capsule Take 100 mg in AM, afternoon, and 600 mg in evening   HYDROcodone-acetaminophen (NORCO) 7.5-325 MG tablet Take 1 tablet by mouth every 6 (six) hours as needed for moderate pain.   HYDROcodone-acetaminophen (NORCO) 7.5-325 MG tablet Take 1 tablet by mouth every 6 (six) hours as needed for moderate pain.   [START ON 06/29/2023] HYDROcodone-acetaminophen (NORCO) 7.5-325 MG tablet Take 1 tablet by mouth every 6 (six) hours as needed for moderate pain.   levothyroxine (SYNTHROID) 25 MCG tablet Take 1 tablet (25 mcg total) by mouth daily.   Melatonin 10 MG CAPS Take 10 mg by mouth at bedtime.   meloxicam (MOBIC) 7.5 MG tablet Take 1 tablet (7.5 mg total) by mouth daily.   Multiple Vitamins-Minerals (PRESERVISION AREDS 2) CAPS Take 1 capsule by mouth 2 (two) times daily.   nystatin (MYCOSTATIN/NYSTOP) powder APPLY TOPICALLY 3 TIMES A DAY   nystatin cream (MYCOSTATIN) APPLY TO AFFECTED AREA TWICE A DAY   solifenacin (VESICARE) 10 MG tablet Take 1 tablet (10 mg total) by mouth daily.   tizanidine (ZANAFLEX) 2 MG capsule Take 2 capsules (4 mg total) by mouth 3 (three) times daily as needed for muscle spasms.   No facility-administered encounter medications  on file as of 06/14/2023.    Allergies (verified) Ace inhibitors   History: Past Medical History:  Diagnosis Date   Arthritis    Complication of anesthesia    Depression    History of YAG laser capsulotomy of lens of left eye    Hyperlipidemia    Hypertension    Hypothyroidism    Insomnia    Macular degeneration    PONV (postoperative nausea and vomiting)    Stroke [facility])    Thyroid disease    hypothyroid   Past Surgical History:  Procedure Laterality Date   BREAST SURGERY     lumpectomy   CATARACT EXTRACTION     CHOLECYSTECTOMY      EYE SURGERY     yag - bilateral, cataracts- bilateral   Torn retina Left    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   TOTAL KNEE ARTHROPLASTY Right 01/04/2022   Procedure: RIGHT TOTAL KNEE ARTHROPLASTY;  Surgeon: Gean Birchwood, MD;  Location: [facility];  Service: Orthopedics;  Laterality: Right;   Family History  Problem Relation Age of Onset   Cancer Father        Lung   Arthritis Father    Dementia Mother    Arthritis Mother    Hypertension Brother    Other Paternal Grandmother        eye tumor   Cancer Paternal Grandfather        prostate   Arthritis Brother    Heart attack Brother    Social History   Socioeconomic History   Marital status: Married    Spouse name: Mayford Knife   Number of children: 3   Years of education: Not on file   Highest education level: 12th grade  Occupational History   Occupation: retired    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 One child passed away. One son lives next door.   Social Determinants of Health   Financial Resource Strain: Low Risk  (06/14/2023)   Overall Financial Resource Strain (CARDIA)    Difficulty of Paying Living Expenses: Not hard at all  Food Insecurity: No Food Insecurity (06/14/2023)   Hunger Vital Sign    Worried About Running Out of Food in the Last Year: Never true    Ran Out of Food in the Last Year: Never true  Transportation Needs: No Transportation Needs (06/14/2023)   PRAPARE - Administrator, Civil Service (Medical): No    Lack of Transportation (Non-Medical): No  Physical Activity:  Insufficiently Active (06/14/2023)   Exercise Vital Sign    Days of Exercise per Week: 3 days    Minutes of Exercise per Session: 30 min  Stress: No Stress Concern Present (06/14/2023)   [employer] of Occupational Health - Occupational Stress Questionnaire    Feeling of Stress : Not at all  Recent Concern: Stress - Stress Concern Present (04/29/2023)   [employer] of Occupational Health - Occupational Stress Questionnaire    Feeling of Stress :  Rather much  Social Connections: Socially Integrated (06/14/2023)   Social Connection and Isolation Panel [NHANES]    Frequency of Communication with Friends and Family: More than three times a week    Frequency of Social Gatherings with Friends and Family: More than three times a week    Attends Religious Services: More than 4 times per year    Active Member of [employer] or Organizations: Yes    Attends Engineer, structural: More than 4 times per year    Marital Status: Married    Tobacco Counseling Counseling given: Not Answered   Clinical Intake:  Pre-visit preparation completed: Yes  Pain : No/denies pain     Nutritional Risks: None Diabetes: No  How often do you need to have someone help you when you read instructions, pamphlets, or other written materials from your doctor or pharmacy?: 1 - Never  Interpreter Needed?: No  Information entered by :: Renie Ora, LPN   Activities of Daily Living    06/14/2023   10:33 AM  In your present state of health, do you have any difficulty performing the following activities:  Hearing? 0  Vision? 0  Difficulty concentrating or making decisions? 0  Walking or climbing stairs? 0  Dressing or bathing? 0  Doing errands, shopping? 0  Preparing Food and eating ? N  Using the Toilet? N  In the past six months, have you accidently leaked urine? N  Do you have problems with loss of bowel control? N  Managing your Medications? N  Managing your Finances? N  Housekeeping or  managing your Housekeeping? N    Patient Care Team: Junie Spencer, FNP as PCP - General (Nurse Practitioner) Maisie Fus, MD as PCP - Cardiology (Cardiology) Carmela Rima, MD as Consulting Physician (Ophthalmology) Gean Birchwood, MD as Consulting Physician (Orthopedic Surgery)  Indicate any recent Medical Services you may have received from other than [facility] providers in the past year (date may be approximate).     Assessment:   This is a routine wellness examination for Georgina.  Hearing/Vision screen Vision Screening - Comments:: Wears rx glasses - up to date with routine eye exams with  Dr. Allena Katz   Dietary issues and exercise activities discussed:     Goals Addressed             This Visit's Progress    DIET - INCREASE WATER INTAKE         Depression Screen    06/14/2023   10:31 AM 05/02/2023   10:25 AM 01/25/2023   10:45 AM 10/28/2022   10:06 AM 07/27/2022   10:11 AM 06/10/2022   11:05 AM 04/22/2022   10:12 AM  PHQ 2/9 Scores  PHQ - 2 Score 0 0 0 0 0 2 0  PHQ- 9 Score 0 0 0 0 0 6     Fall Risk    06/14/2023   10:30 AM 05/02/2023   10:24 AM 01/25/2023   10:45 AM 10/28/2022   10:06 AM 07/27/2022   10:11 AM  Fall Risk   Falls in the past year? 0 0 1 0 1  Number falls in past yr: 0 0 1  1  Injury with Fall? 0 0 0  0  Risk for fall due to : No Fall Risks Impaired vision History of fall(s)    Follow up Falls prevention discussed Falls evaluation completed;Education provided Falls evaluation completed  Falls prevention discussed    MEDICARE RISK AT HOME: Medicare Risk at Home  Any stairs in or around the home?: No If so, are there any without handrails?: No Home free of loose throw rugs in walkways, pet beds, electrical cords, etc?: Yes Adequate lighting in your home to reduce risk of falls?: Yes Life alert?: No Use of a cane, walker or w/c?: No Grab bars in the bathroom?: Yes Shower chair or bench in shower?: Yes Elevated toilet seat or a handicapped toilet?:  Yes  TIMED UP AND GO:  Was the test performed?  No    Cognitive Function:        06/14/2023   10:33 AM 06/10/2022   11:09 AM 05/11/2019    9:55 AM  6CIT Screen  What Year? 0 points 0 points 0 points  What month? 0 points 0 points 0 points  What time? 0 points 0 points 0 points  Count back from 20 0 points 0 points 0 points  Months in reverse 0 points 0 points 0 points  Repeat phrase 0 points 0 points 0 points  Total Score 0 points 0 points 0 points    Immunizations Immunization History  Administered Date(s) Administered   Fluad Quad(high Dose 65+) 08/16/2019, 10/20/2020   Influenza, High Dose Seasonal PF 10/11/2017, 10/10/2018   Influenza,inj,Quad PF,6+ Mos 08/14/2013, 11/11/2014, 08/08/2015, 07/13/2016   Janssen (J&J) SARS-COV-2 Vaccination 02/21/2020   PFIZER(Purple Top)SARS-COV-2 Vaccination 11/26/2020   Pneumococcal Conjugate-13 07/13/2016   Pneumococcal Polysaccharide-23 08/07/2012   Tdap 09/25/2007, 06/11/2014    TDAP status: Up to date  Flu Vaccine status: Up to date  Pneumococcal vaccine status: Up to date  Covid-19 vaccine status: Completed vaccines  Qualifies for Shingles Vaccine? Yes   Zostavax completed No   Shingrix Completed?: No.    Education has been provided regarding the importance of this vaccine. Patient has been advised to call insurance company to determine out of pocket expense if they have not yet received this vaccine. Advised may also receive vaccine at local pharmacy or Health Dept. Verbalized acceptance and understanding.  Screening Tests Health Maintenance  Topic Date Due   DEXA SCAN  01/12/2017   COVID-19 Vaccine (3 - 2023-24 season) 06/25/2022   INFLUENZA VACCINE  05/26/2023   Zoster Vaccines- Shingrix (1 of 2) 08/02/2023 (Originally 08/24/1993)   DTaP/Tdap/Td (3 - Td or Tdap) 06/11/2024   Medicare Annual Wellness (AWV)  06/13/2024   Pneumonia Vaccine 62+ Years old  Completed   Hepatitis C Screening  Completed   HPV VACCINES   Aged Out   Colonoscopy  Discontinued    Health Maintenance  Health Maintenance Due  Topic Date Due   DEXA SCAN  01/12/2017   COVID-19 Vaccine (3 - 2023-24 season) 06/25/2022   INFLUENZA VACCINE  05/26/2023    Colorectal cancer screening: No longer required.   Mammogram status: No longer required due to age.  Bone Density status: Ordered 06/14/2023. Pt provided with contact info and advised to call to schedule appt.  Lung Cancer Screening: (Low Dose CT Chest recommended if Age 54-80 years, 20 pack-year currently smoking OR have quit w/in 15years.) does not qualify.   Lung Cancer Screening Referral: n/a  Additional Screening:  Hepatitis C Screening: does not qualify; Completed 01/047/2009  Vision Screening: Recommended annual ophthalmology exams for early detection of glaucoma and other disorders of the eye. Is the patient up to date with their annual eye exam?  Yes  Who is the provider or what is the name of the office in which the patient attends annual eye exams? Dr.Patel  If pt is  not established with a provider, would they like to be referred to a provider to establish care? No .   Dental Screening: Recommended annual dental exams for proper oral hygiene   Community Resource Referral / Chronic Care Management: CRR required this visit?  No   CCM required this visit?  No     Plan:     I have personally reviewed and noted the following in the patient's chart:   Medical and social history Use of alcohol, tobacco or illicit drugs  Current medications and supplements including opioid prescriptions. Patient is not currently taking opioid prescriptions. Functional ability and status Nutritional status Physical activity Advanced directives List of other physicians Hospitalizations, surgeries, and ER visits in previous 12 months Vitals Screenings to include cognitive, depression, and falls Referrals and appointments  In addition, I have reviewed and discussed with  patient certain preventive protocols, quality metrics, and best practice recommendations. A written personalized care plan for preventive services as well as general preventive health recommendations were provided to patient.     Lorrene Reid, LPN   [id number]   After Visit Summary: (MyChart) Due to this being a telephonic visit, the after visit summary with patients personalized plan was offered to patient via MyChart   Nurse Notes: none

## 2023-06-14 NOTE — Patient Instructions (Signed)
[name] , Thank you for taking time to come for your Medicare Wellness Visit. I appreciate your ongoing commitment to your health goals. Please review the following plan we discussed and let me know if I can assist you in the future.   Referrals/Orders/Follow-Ups/Clinician Recommendations: Aim for 30 minutes of exercise or brisk walking, 6-8 glasses of water, and 5 servings of fruits and vegetables each day.   This is a list of the screening recommended for you and due dates:  Health Maintenance  Topic Date Due   DEXA scan (bone density measurement)  01/12/2017   COVID-19 Vaccine (3 - 2023-24 season) 06/25/2022   Flu Shot  05/26/2023   Zoster (Shingles) Vaccine (1 of 2) 08/02/2023*   DTaP/Tdap/Td vaccine (3 - Td or Tdap) 06/11/2024   Medicare Annual Wellness Visit  06/13/2024   Pneumonia Vaccine  Completed   Hepatitis C Screening  Completed   HPV Vaccine  Aged Out   Colon Cancer Screening  Discontinued  *Topic was postponed. The date shown is not the original due date.    Advanced directives: (Copy Requested) Please bring a copy of your health care power of attorney and living will to the office to be added to your chart at your convenience.  Next Medicare Annual Wellness Visit scheduled for next year: Yes  Preventive Care 25 Years and Older, Female Preventive care refers to lifestyle choices and visits with your health care provider that can promote health and wellness. What does preventive care include? A yearly physical exam. This is also called an annual well check. Dental exams once or twice a year. Routine eye exams. Ask your health care provider how often you should have your eyes checked. Personal lifestyle choices, including: Daily care of your teeth and gums. Regular physical activity. Eating a healthy diet. Avoiding tobacco and drug use. Limiting alcohol use. Practicing safe sex. Taking low-dose aspirin every day. Taking vitamin and mineral supplements as  recommended by your health care provider. What happens during an annual well check? The services and screenings done by your health care provider during your annual well check will depend on your age, overall health, lifestyle risk factors, and family history of disease. Counseling  Your health care provider may ask you questions about your: Alcohol use. Tobacco use. Drug use. Emotional well-being. Home and relationship well-being. Sexual activity. Eating habits. History of falls. Memory and ability to understand (cognition). Work and work Astronomer. Reproductive health. Screening  You may have the following tests or measurements: Height, weight, and BMI. Blood pressure. Lipid and cholesterol levels. These may be checked every 5 years, or more frequently if you are over 76 years old. Skin check. Lung cancer screening. You may have this screening every year starting at age 56 if you have a 30-pack-year history of smoking and currently smoke or have quit within the past 15 years. Fecal occult blood test (FOBT) of the stool. You may have this test every year starting at age 56. Flexible sigmoidoscopy or colonoscopy. You may have a sigmoidoscopy every 5 years or a colonoscopy every 10 years starting at age 47. Hepatitis C blood test. Hepatitis B blood test. Sexually transmitted disease (STD) testing. Diabetes screening. This is done by checking your blood sugar (glucose) after you have not eaten for a while (fasting). You may have this done every 1-3 years. Bone density scan. This is done to screen for osteoporosis. You may have this done starting at age 78. Mammogram. This may be done every 1-2  years. Talk to your health care provider about how often you should have regular mammograms. Talk with your health care provider about your test results, treatment options, and if necessary, the need for more tests. Vaccines  Your health care provider may recommend certain vaccines, such  as: Influenza vaccine. This is recommended every year. Tetanus, diphtheria, and acellular pertussis (Tdap, Td) vaccine. You may need a Td booster every 10 years. Zoster vaccine. You may need this after age 46. Pneumococcal 13-valent conjugate (PCV13) vaccine. One dose is recommended after age 37. Pneumococcal polysaccharide (PPSV23) vaccine. One dose is recommended after age 51. Talk to your health care provider about which screenings and vaccines you need and how often you need them. This information is not intended to replace advice given to you by your health care provider. Make sure you discuss any questions you have with your health care provider. Document Released: 11/07/2015 Document Revised: 06/30/2016 Document Reviewed: 08/12/2015 Elsevier Interactive Patient Education  2017 [employer].  Fall Prevention in the Home Falls can cause injuries. They can happen to people of all ages. There are many things you can do to make your home safe and to help prevent falls. What can I do on the outside of my home? Regularly fix the edges of walkways and driveways and fix any cracks. Remove anything that might make you trip as you walk through a door, such as a raised step or threshold. Trim any bushes or trees on the path to your home. Use bright outdoor lighting. Clear any walking paths of anything that might make someone trip, such as rocks or tools. Regularly check to see if handrails are loose or broken. Make sure that both sides of any steps have handrails. Any raised decks and porches should have guardrails on the edges. Have any leaves, snow, or ice cleared regularly. Use sand or salt on walking paths during winter. Clean up any spills in your garage right away. This includes oil or grease spills. What can I do in the bathroom? Use night lights. Install grab bars by the toilet and in the tub and shower. Do not use towel bars as grab bars. Use non-skid mats or decals in the tub or  shower. If you need to sit down in the shower, use a plastic, non-slip stool. Keep the floor dry. Clean up any water that spills on the floor as soon as it happens. Remove soap buildup in the tub or shower regularly. Attach bath mats securely with double-sided non-slip rug tape. Do not have throw rugs and other things on the floor that can make you trip. What can I do in the bedroom? Use night lights. Make sure that you have a light by your bed that is easy to reach. Do not use any sheets or blankets that are too big for your bed. They should not hang down onto the floor. Have a firm chair that has side arms. You can use this for support while you get dressed. Do not have throw rugs and other things on the floor that can make you trip. What can I do in the kitchen? Clean up any spills right away. Avoid walking on wet floors. Keep items that you use a lot in easy-to-reach places. If you need to reach something above you, use a strong step stool that has a grab bar. Keep electrical cords out of the way. Do not use floor polish or wax that makes floors slippery. If you must use wax, use non-skid floor  wax. Do not have throw rugs and other things on the floor that can make you trip. What can I do with my stairs? Do not leave any items on the stairs. Make sure that there are handrails on both sides of the stairs and use them. Fix handrails that are broken or loose. Make sure that handrails are as long as the stairways. Check any carpeting to make sure that it is firmly attached to the stairs. Fix any carpet that is loose or worn. Avoid having throw rugs at the top or bottom of the stairs. If you do have throw rugs, attach them to the floor with carpet tape. Make sure that you have a light switch at the top of the stairs and the bottom of the stairs. If you do not have them, ask someone to add them for you. What else can I do to help prevent falls? Wear shoes that: Do not have high heels. Have  rubber bottoms. Are comfortable and fit you well. Are closed at the toe. Do not wear sandals. If you use a stepladder: Make sure that it is fully opened. Do not climb a closed stepladder. Make sure that both sides of the stepladder are locked into place. Ask someone to hold it for you, if possible. Clearly mark and make sure that you can see: Any grab bars or handrails. First and last steps. Where the edge of each step is. Use tools that help you move around (mobility aids) if they are needed. These include: Canes. Walkers. Scooters. Crutches. Turn on the lights when you go into a dark area. Replace any light bulbs as soon as they burn out. Set up your furniture so you have a clear path. Avoid moving your furniture around. If any of your floors are uneven, fix them. If there are any pets around you, be aware of where they are. Review your medicines with your doctor. Some medicines can make you feel dizzy. This can increase your chance of falling. Ask your doctor what other things that you can do to help prevent falls. This information is not intended to replace advice given to you by your health care provider. Make sure you discuss any questions you have with your health care provider. Document Released: 08/07/2009 Document Revised: 03/18/2016 Document Reviewed: 11/15/2014 Elsevier Interactive Patient Education  2017 [employer].

## 2023-07-18 ENCOUNTER — Other Ambulatory Visit: Payer: Self-pay | Admitting: Family

## 2023-07-18 DIAGNOSIS — G890 Central pain syndrome: Secondary | ICD-10-CM

## 2023-07-18 DIAGNOSIS — G8929 Other chronic pain: Secondary | ICD-10-CM

## 2023-07-23 ENCOUNTER — Other Ambulatory Visit: Payer: Self-pay | Admitting: Family

## 2023-07-23 DIAGNOSIS — F331 Major depressive disorder, recurrent, moderate: Secondary | ICD-10-CM

## 2023-07-23 DIAGNOSIS — F411 Generalized anxiety disorder: Secondary | ICD-10-CM

## 2023-08-02 ENCOUNTER — Ambulatory Visit (INDEPENDENT_AMBULATORY_CARE_PROVIDER_SITE_OTHER): Payer: Medicare Other

## 2023-08-02 ENCOUNTER — Ambulatory Visit (INDEPENDENT_AMBULATORY_CARE_PROVIDER_SITE_OTHER): Payer: Medicare Other | Admitting: Family

## 2023-08-02 ENCOUNTER — Encounter: Payer: Self-pay | Admitting: Family

## 2023-08-02 VITALS — BP 142/74 | HR 68 | Temp 97.0°F | Ht 62.0 in | Wt 192.2 lb

## 2023-08-02 DIAGNOSIS — M5442 Lumbago with sciatica, left side: Secondary | ICD-10-CM | POA: Diagnosis not present

## 2023-08-02 DIAGNOSIS — G890 Central pain syndrome: Secondary | ICD-10-CM

## 2023-08-02 DIAGNOSIS — I10 Essential (primary) hypertension: Secondary | ICD-10-CM | POA: Diagnosis not present

## 2023-08-02 DIAGNOSIS — M1711 Unilateral primary osteoarthritis, right knee: Secondary | ICD-10-CM

## 2023-08-02 DIAGNOSIS — E669 Obesity, unspecified: Secondary | ICD-10-CM

## 2023-08-02 DIAGNOSIS — E782 Mixed hyperlipidemia: Secondary | ICD-10-CM

## 2023-08-02 DIAGNOSIS — Z780 Asymptomatic menopausal state: Secondary | ICD-10-CM | POA: Diagnosis not present

## 2023-08-02 DIAGNOSIS — G4700 Insomnia, unspecified: Secondary | ICD-10-CM

## 2023-08-02 DIAGNOSIS — F331 Major depressive disorder, recurrent, moderate: Secondary | ICD-10-CM

## 2023-08-02 DIAGNOSIS — F1120 Opioid dependence, uncomplicated: Secondary | ICD-10-CM

## 2023-08-02 DIAGNOSIS — G8929 Other chronic pain: Secondary | ICD-10-CM

## 2023-08-02 DIAGNOSIS — I6930 Unspecified sequelae of cerebral infarction: Secondary | ICD-10-CM

## 2023-08-02 DIAGNOSIS — E039 Hypothyroidism, unspecified: Secondary | ICD-10-CM

## 2023-08-02 MED ORDER — HYDROCODONE-ACETAMINOPHEN 7.5-325 MG PO TABS: 1.0000 | ORAL_TABLET | Freq: Four times a day (QID) | ORAL | 0 refills | Status: DC | PRN

## 2023-08-02 NOTE — Patient Instructions (Signed)
Health Maintenance After Age 80 After age 80, you are at a higher risk for certain long-term diseases and infections as well as injuries from falls. Falls are a major cause of broken bones and head injuries in people who are older than age 80. Getting regular preventive care can help to keep you healthy and well. Preventive care includes getting regular testing and making lifestyle changes as recommended by your health care provider. Talk with your health care provider about: Which screenings and tests you should have. A screening is a test that checks for a disease when you have no symptoms. A diet and exercise plan that is right for you. What should I know about screenings and tests to prevent falls? Screening and testing are the best ways to find a health problem early. Early diagnosis and treatment give you the best chance of managing medical conditions that are common after age 80. Certain conditions and lifestyle choices may make you more likely to have a fall. Your health care provider may recommend: Regular vision checks. Poor vision and conditions such as cataracts can make you more likely to have a fall. If you wear glasses, make sure to get your prescription updated if your vision changes. Medicine review. Work with your health care provider to regularly review all of the medicines you are taking, including over-the-counter medicines. Ask your health care provider about any side effects that may make you more likely to have a fall. Tell your health care provider if any medicines that you take make you feel dizzy or sleepy. Strength and balance checks. Your health care provider may recommend certain tests to check your strength and balance while standing, walking, or changing positions. Foot health exam. Foot pain and numbness, as well as not wearing proper footwear, can make you more likely to have a fall. Screenings, including: Osteoporosis screening. Osteoporosis is a condition that causes  the bones to get weaker and break more easily. Blood pressure screening. Blood pressure changes and medicines to control blood pressure can make you feel dizzy. Depression screening. You may be more likely to have a fall if you have a fear of falling, feel depressed, or feel unable to do activities that you used to do. Alcohol use screening. Using too much alcohol can affect your balance and may make you more likely to have a fall. Follow these instructions at home: Lifestyle Do not drink alcohol if: Your health care provider tells you not to drink. If you drink alcohol: Limit how much you have to: 0-1 drink a day for women. 0-2 drinks a day for men. Know how much alcohol is in your drink. In the [place] one drink equals one 12 oz bottle of beer (355 mL), one 5 oz glass of wine (148 mL), or one 1 oz glass of hard liquor (44 mL). Do not use any products that contain nicotine or tobacco. These products include cigarettes, chewing tobacco, and vaping devices, such as e-cigarettes. If you need help quitting, ask your health care provider. Activity  Follow a regular exercise program to stay fit. This will help you maintain your balance. Ask your health care provider what types of exercise are appropriate for you. If you need a cane or walker, use it as recommended by your health care provider. Wear supportive shoes that have nonskid soles. Safety  Remove any tripping hazards, such as rugs, cords, and clutter. Install safety equipment such as grab bars in bathrooms and safety rails on stairs. Keep rooms and walkways   well-lit. General instructions Talk with your health care provider about your risks for falling. Tell your health care provider if: You fall. Be sure to tell your health care provider about all falls, even ones that seem minor. You feel dizzy, tiredness (fatigue), or off-balance. Take over-the-counter and prescription medicines only as told by your health care provider. These include  supplements. Eat a healthy diet and maintain a healthy weight. A healthy diet includes low-fat dairy products, low-fat (lean) meats, and fiber from whole grains, beans, and lots of fruits and vegetables. Stay current with your vaccines. Schedule regular health, dental, and eye exams. Summary Having a healthy lifestyle and getting preventive care can help to protect your health and wellness after age 80. Screening and testing are the best way to find a health problem early and help you avoid having a fall. Early diagnosis and treatment give you the best chance for managing medical conditions that are more common for people who are older than age 80. Falls are a major cause of broken bones and head injuries in people who are older than age 80. Take precautions to prevent a fall at home. Work with your health care provider to learn what changes you can make to improve your health and wellness and to prevent falls. This information is not intended to replace advice given to you by your health care provider. Make sure you discuss any questions you have with your health care provider. Document Revised: 03/02/2021 Document Reviewed: 03/02/2021 Elsevier Patient Education  2024 [employer]  

## 2023-08-02 NOTE — Progress Notes (Signed)
Subjective:    Patient ID: Allison Parker, female    DOB: 1941-01-10, 82 y.o.   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 PT states this is stable at this time.  Pt is followed by Ortho annually for arthritis.   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 Her pain has greatly improved.    Reports she is having urinary frequency and urgency. This was improving when taking vesicare, but insurance would not cover. She has been taking oxybutynin 15 mg with relief, but having dry mouth.   Hypertension This is a chronic problem. The current episode started more than 1 year ago. The problem has been waxing and waning since onset. The problem is uncontrolled. Associated symptoms include anxiety, malaise/fatigue and peripheral edema. Pertinent negatives include no shortness of breath. Risk factors for coronary artery disease include dyslipidemia and sedentary lifestyle. The current treatment provides moderate improvement. Identifiable causes of hypertension include a thyroid problem.  Thyroid Problem Presents for follow-up visit. Symptoms include anxiety, dry skin and fatigue. Patient reports no constipation or depressed mood. The symptoms have been stable. Her past medical history is significant for hyperlipidemia.  Arthritis Presents for follow-up visit. She complains of pain and stiffness. She reports no joint swelling. Affected locations include the left knee, right knee, left MCP, right MCP, right hip, left hip, right shoulder and left shoulder. Her pain is at a severity of 9/10. Associated symptoms include fatigue.  Urinary Frequency  This is a chronic problem. The current episode started more than 1 year ago. The patient is  experiencing no pain. Associated symptoms include frequency. Treatments tried: oxybutynin. The treatment provided moderate relief.  Insomnia Primary symptoms: sleep disturbance, difficulty falling asleep, frequent awakening, malaise/fatigue.   The current episode started more than one year. Past treatments include medication. The treatment provided moderate relief. PMH includes: depression.   Hyperlipidemia This is a chronic problem. The current episode started more than 1 year ago. The problem is controlled. Recent lipid tests were reviewed and are normal. Pertinent negatives include no shortness of breath. Current antihyperlipidemic treatment includes statins. The current treatment provides moderate improvement of lipids. Risk factors for coronary artery disease include dyslipidemia, hypertension and a sedentary lifestyle.  Anxiety Presents for follow-up visit. Symptoms include excessive worry, insomnia, nervous/anxious behavior and restlessness. Patient reports no depressed mood or shortness of breath. Symptoms occur occasionally. The severity of symptoms is moderate.    Depression        This is a chronic problem.  The current episode started more than 1 year ago.   The problem occurs intermittently.  Associated symptoms include fatigue, insomnia, restlessness and sad.  Associated symptoms include no helplessness and no hopelessness.  Past medical history includes thyroid problem and anxiety.   Back Pain This is a chronic problem. The current episode started more than 1 year ago. The problem occurs intermittently. The problem has been waxing and waning since onset. The pain is present in the lumbar spine. The quality of the pain is described as aching. The pain is at a severity of 8/10. The pain is moderate. Risk factors include obesity. She has tried bed rest and analgesics for the symptoms. The treatment provided moderate  relief.   Current opioids rx- Norco 10-325 mg  # meds rx-  90 Effectiveness of current meds-stable Adverse reactions from pain meds-n/a Morphine equivalent- 30   Pill count performed-No Last drug screen - 05/02/23 ( high risk q45m, moderate risk q4m, low risk yearly ) Urine drug screen today- Yes Was the [facility] reviewed- Yes             If yes were their any concerning findings? - none   Pain contract signed on: 05/02/23    Review of Systems  Constitutional:  Positive for fatigue and malaise/fatigue.  Respiratory:  Negative for shortness of breath.   Gastrointestinal:  Negative for constipation.  Genitourinary:  Positive for frequency.  Musculoskeletal:  Positive for arthritis, back pain and stiffness. Negative for joint swelling.  Psychiatric/Behavioral:  Positive for depression and sleep disturbance. The patient is nervous/anxious and has insomnia.   All other systems reviewed and are negative.      Objective:   Physical Exam Vitals reviewed.  Constitutional:      General: She is not in acute distress.    Appearance: She is well-developed.  HENT:     Head: Normocephalic and atraumatic.     Right Ear: Tympanic membrane normal.     Left Ear: Tympanic membrane normal.  Eyes:     Pupils: Pupils are equal, round, and reactive to light.  Neck:     Thyroid: No thyromegaly.  Cardiovascular:     Rate and Rhythm: Normal rate and regular rhythm.     Heart sounds: Normal heart sounds. No murmur heard. Pulmonary:     Effort: Pulmonary effort is normal. No respiratory distress.     Breath sounds: Normal breath sounds. No wheezing.  Abdominal:     General: Bowel sounds are normal. There is no distension.     Palpations: Abdomen is soft.     Tenderness: There is no abdominal tenderness.  Musculoskeletal:        General: Tenderness present. No deformity.     Cervical back: Normal range of motion and neck supple.     Right lower leg: Edema (trace) present.     Left lower leg: Edema (trace) present.     Comments: Right sided weakness, pain  in lumbar with flexion and extension   Skin:    General: Skin is warm and dry.  Neurological:     Mental Status: She is alert and oriented to person, place, and time.     Cranial Nerves: No cranial nerve deficit.     Motor: Weakness present.     Gait: Gait abnormal.     Deep Tendon Reflexes: Reflexes are normal and symmetric.  Psychiatric:        Behavior: Behavior normal.        Thought Content: Thought content normal.        Judgment: Judgment normal.      BP (!) 142/74   Pulse 68   Temp (!) 97 F (36.1 C) (Temporal)   Ht 5\' 2"  (1.575 m)   Wt 192 lb 3.2 oz (87.2 kg)   SpO2 96%   BMI 35.15 kg/m      Assessment & Plan:  Allison Parker comes in today with chief complaint of Medical Management of Chronic Issues   Diagnosis and orders addressed:  1. Thalamic pain syndrome - HYDROcodone-acetaminophen (NORCO) 7.5-325 MG tablet; Take 1 tablet by mouth every 6 (six) hours as needed for moderate pain or severe pain.  Dispense: 120 tablet; Refill:  0 - HYDROcodone-acetaminophen (NORCO) 7.5-325 MG tablet; Take 1 tablet by mouth every 6 (six) hours as needed for moderate pain.  Dispense: 120 tablet; Refill: 0 - HYDROcodone-acetaminophen (NORCO) 7.5-325 MG tablet; Take 1 tablet by mouth every 6 (six) hours as needed for moderate pain.  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  Dispense: 120 tablet; Refill: 0 - HYDROcodone-acetaminophen (NORCO) 7.5-325 MG tablet; Take 1 tablet by mouth every 6 (six) hours as needed for moderate pain.  Dispense: 120 tablet; Refill: 0 - HYDROcodone-acetaminophen (NORCO) 7.5-325 MG tablet; Take 1 tablet by mouth every 6 (six) hours as needed for moderate pain.  Dispense: 120 tablet; Refill: 0 - CMP14+EGFR  3. Pain medication agreement signed -  HYDROcodone-acetaminophen (NORCO) 7.5-325 MG tablet; Take 1 tablet by mouth every 6 (six) hours as needed for moderate pain or severe pain.  Dispense: 120 tablet; Refill: 0 - HYDROcodone-acetaminophen (NORCO) 7.5-325 MG tablet; Take 1 tablet by mouth every 6 (six) hours as needed for moderate pain.  Dispense: 120 tablet; Refill: 0 - HYDROcodone-acetaminophen (NORCO) 7.5-325 MG tablet; Take 1 tablet by mouth every 6 (six) hours as needed for moderate pain.  Dispense: 120 tablet; Refill: 0 - CMP14+EGFR  4. Arthritis of right knee - HYDROcodone-acetaminophen (NORCO) 7.5-325 MG tablet; Take 1 tablet by mouth every 6 (six) hours as needed for moderate pain or severe pain.  Dispense: 120 tablet; Refill: 0 - HYDROcodone-acetaminophen (NORCO) 7.5-325 MG tablet; Take 1 tablet by mouth every 6 (six) hours as needed for moderate pain.  Dispense: 120 tablet; Refill: 0 - HYDROcodone-acetaminophen (NORCO) 7.5-325 MG tablet; Take 1 tablet by mouth every 6 (six) hours as needed for moderate pain.  Dispense: 120 tablet; Refill: 0 - CMP14+EGFR  5. Uncomplicated opioid dependence (HCC) - HYDROcodone-acetaminophen (NORCO) 7.5-325 MG tablet; Take 1 tablet by mouth every 6 (six) hours as needed for moderate pain or severe pain.  Dispense: 120 tablet; Refill: 0 - HYDROcodone-acetaminophen (NORCO) 7.5-325 MG tablet; Take 1 tablet by mouth every 6 (six) hours as needed for moderate pain.  Dispense: 120 tablet; Refill: 0 - HYDROcodone-acetaminophen (NORCO) 7.5-325 MG tablet; Take 1 tablet by mouth every 6 (six) hours as needed for moderate pain.  Dispense: 120 tablet; Refill: 0 - CMP14+EGFR  6. Osteoarthritis of right knee, unspecified osteoarthritis type - HYDROcodone-acetaminophen (NORCO) 7.5-325 MG tablet; Take 1 tablet by mouth every 6 (six) hours as needed for moderate pain or severe pain.  Dispense: 120 tablet; Refill: 0 - HYDROcodone-acetaminophen (NORCO) 7.5-325 MG tablet; Take 1 tablet by mouth every 6 (six) hours  as needed for moderate pain.  Dispense: 120 tablet; Refill: 0 - HYDROcodone-acetaminophen (NORCO) 7.5-325 MG tablet; Take 1 tablet by mouth every 6 (six) hours as needed for moderate pain.  Dispense: 120 tablet; Refill: 0 - CMP14+EGFR  7. Essential hypertension, benign  - CMP14+EGFR  8. Moderate episode of recurrent major depressive disorder (HCC) - CMP14+EGFR  9. Obesity (BMI 30-39.9) - CMP14+EGFR  10. Late effect of cerebrovascular accident - CMP14+EGFR  11. Insomnia, unspecified type - CMP14+EGFR  12. Hypothyroidism, unspecified type - CMP14+EGFR  13. Mixed hyperlipidemia - CMP14+EGFR  14. GAD (generalized anxiety disorder) - CMP14+EGFR   Labs pending Continue current medications Patient reviewed in [place] controlled database, no flags noted. Contract and drug screen are up to date.  Health Maintenance  reviewed Diet and exercise encouraged  Follow up plan: 3 months    Jannifer Rodney, FNP

## 2023-08-03 LAB — CMP14+EGFR
ALT: 66 [IU]/L — ABNORMAL HIGH (ref 0–32)
AST: 28 [IU]/L (ref 0–40)
Albumin: 4.2 g/dL (ref 3.8–4.8)
Alkaline Phosphatase: 161 [IU]/L — ABNORMAL HIGH (ref 44–121)
BUN/Creatinine Ratio: 24 (ref 12–28)
BUN: 19 mg/dL (ref 8–27)
Bilirubin Total: 0.3 mg/dL (ref 0.0–1.2)
CO2: 25 mmol/L (ref 20–29)
Calcium: 9.4 mg/dL (ref 8.7–10.3)
Chloride: 101 mmol/L (ref 96–106)
Creatinine, Ser: 0.78 mg/dL (ref 0.57–1.00)
Globulin, Total: 2.1 g/dL (ref 1.5–4.5)
Glucose: 89 mg/dL (ref 70–99)
Potassium: 4.8 mmol/L (ref 3.5–5.2)
Sodium: 140 mmol/L (ref 134–144)
Total Protein: 6.3 g/dL (ref 6.0–8.5)
eGFR: 77 mL/min/{1.73_m2} (ref >59)

## 2023-08-04 ENCOUNTER — Other Ambulatory Visit: Payer: Self-pay | Admitting: Family

## 2023-08-04 DIAGNOSIS — M8580 Other specified disorders of bone density and structure, unspecified site: Secondary | ICD-10-CM | POA: Insufficient documentation

## 2023-08-04 MED ORDER — HYDROCODONE-ACETAMINOPHEN 10-325 MG PO TABS: 1.0000 | ORAL_TABLET | Freq: Four times a day (QID) | ORAL | 0 refills | Status: DC | PRN

## 2023-08-16 ENCOUNTER — Other Ambulatory Visit: Payer: Self-pay | Admitting: Family

## 2023-08-16 DIAGNOSIS — I10 Essential (primary) hypertension: Secondary | ICD-10-CM

## 2023-09-13 ENCOUNTER — Other Ambulatory Visit: Payer: Self-pay | Admitting: Family

## 2023-09-13 DIAGNOSIS — G890 Central pain syndrome: Secondary | ICD-10-CM

## 2023-09-13 DIAGNOSIS — G8929 Other chronic pain: Secondary | ICD-10-CM

## 2023-10-12 ENCOUNTER — Other Ambulatory Visit: Payer: Self-pay | Admitting: Family

## 2023-10-12 DIAGNOSIS — E782 Mixed hyperlipidemia: Secondary | ICD-10-CM

## 2023-10-20 ENCOUNTER — Other Ambulatory Visit: Payer: Self-pay | Admitting: Family

## 2023-10-20 DIAGNOSIS — B372 Candidiasis of skin and nail: Secondary | ICD-10-CM

## 2023-10-20 DIAGNOSIS — I10 Essential (primary) hypertension: Secondary | ICD-10-CM

## 2023-10-20 DIAGNOSIS — N3281 Overactive bladder: Secondary | ICD-10-CM

## 2023-10-21 ENCOUNTER — Other Ambulatory Visit: Payer: Self-pay | Admitting: Family

## 2023-10-21 DIAGNOSIS — B372 Candidiasis of skin and nail: Secondary | ICD-10-CM

## 2023-10-21 NOTE — Telephone Encounter (Signed)
  nystatin ointment (MYCOSTATIN)    Pharmacy comment: Alternative Requested:THE PRESCRIBED MEDICATION IS NOT COVERED BY INSURANCE. PLEASE CONSIDER CHANGING TO ONE OF THE SUGGESTED COVERED ALTERNATIVES.   All Pharmacy Suggested Alternatives:  nystatin ointment (MYCOSTATIN) clotrimazole (LOTRIMIN) 1 % cream

## 2023-10-22 ENCOUNTER — Other Ambulatory Visit: Payer: Self-pay | Admitting: Family

## 2023-10-22 DIAGNOSIS — F331 Major depressive disorder, recurrent, moderate: Secondary | ICD-10-CM

## 2023-10-22 DIAGNOSIS — F411 Generalized anxiety disorder: Secondary | ICD-10-CM

## 2023-11-02 ENCOUNTER — Other Ambulatory Visit: Payer: Self-pay | Admitting: Family

## 2023-11-02 DIAGNOSIS — G8929 Other chronic pain: Secondary | ICD-10-CM

## 2023-11-02 DIAGNOSIS — G890 Central pain syndrome: Secondary | ICD-10-CM

## 2023-11-04 ENCOUNTER — Encounter: Payer: Self-pay | Admitting: Family

## 2023-11-04 ENCOUNTER — Ambulatory Visit (INDEPENDENT_AMBULATORY_CARE_PROVIDER_SITE_OTHER): Payer: Medicare Other | Admitting: Family

## 2023-11-04 VITALS — BP 107/64 | HR 79 | Temp 97.9°F | Ht 62.0 in | Wt 189.2 lb

## 2023-11-04 DIAGNOSIS — Z0289 Encounter for other administrative examinations: Secondary | ICD-10-CM

## 2023-11-04 DIAGNOSIS — I10 Essential (primary) hypertension: Secondary | ICD-10-CM | POA: Diagnosis not present

## 2023-11-04 DIAGNOSIS — N3281 Overactive bladder: Secondary | ICD-10-CM

## 2023-11-04 DIAGNOSIS — E039 Hypothyroidism, unspecified: Secondary | ICD-10-CM

## 2023-11-04 DIAGNOSIS — E782 Mixed hyperlipidemia: Secondary | ICD-10-CM

## 2023-11-04 DIAGNOSIS — G8929 Other chronic pain: Secondary | ICD-10-CM

## 2023-11-04 DIAGNOSIS — F411 Generalized anxiety disorder: Secondary | ICD-10-CM

## 2023-11-04 DIAGNOSIS — G4700 Insomnia, unspecified: Secondary | ICD-10-CM

## 2023-11-04 DIAGNOSIS — F331 Major depressive disorder, recurrent, moderate: Secondary | ICD-10-CM

## 2023-11-04 DIAGNOSIS — M1711 Unilateral primary osteoarthritis, right knee: Secondary | ICD-10-CM

## 2023-11-04 DIAGNOSIS — E669 Obesity, unspecified: Secondary | ICD-10-CM

## 2023-11-04 DIAGNOSIS — I6930 Unspecified sequelae of cerebral infarction: Secondary | ICD-10-CM

## 2023-11-04 DIAGNOSIS — M5442 Lumbago with sciatica, left side: Secondary | ICD-10-CM

## 2023-11-04 DIAGNOSIS — F1120 Opioid dependence, uncomplicated: Secondary | ICD-10-CM

## 2023-11-04 MED ORDER — HYDROCODONE-ACETAMINOPHEN 10-325 MG PO TABS: 1.0000 | ORAL_TABLET | Freq: Four times a day (QID) | ORAL | 0 refills | Status: DC | PRN

## 2023-11-04 MED ORDER — TOLTERODINE TARTRATE ER 4 MG PO CP24: 4.0000 mg | ORAL_CAPSULE | Freq: Every day | ORAL | 1 refills | Status: DC

## 2023-11-04 MED ORDER — CITALOPRAM HYDROBROMIDE 40 MG PO TABS: 40.0000 mg | ORAL_TABLET | Freq: Every day | ORAL | 0 refills | Status: DC

## 2023-11-04 NOTE — Progress Notes (Signed)
 Subjective:    Patient ID: Allison Parker, female    DOB: [birth year], 83 y.o.   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 PT states this is stable at this time.  Pt is followed by Ortho annually for arthritis.   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 Her pain has greatly improved.    Reports she is having urinary frequency and urgency. This was improving when taking vesicare , but insurance would not cover. She has been taking oxybutynin  15 mg with relief, but having dry mouth.   Hypertension This is a chronic problem. The current episode started more than 1 year ago. The problem has been resolved since onset. The problem is uncontrolled. Associated symptoms include anxiety, malaise/fatigue and peripheral edema. Pertinent negatives include no shortness of breath. Risk factors for coronary artery disease include dyslipidemia and sedentary lifestyle. The current treatment provides moderate improvement. Hypertensive end-organ damage includes CVA. Identifiable causes of hypertension include a thyroid  problem.  Thyroid  Problem Presents for follow-up visit. Symptoms include anxiety, dry skin and fatigue. Patient reports no constipation or depressed mood. The symptoms have been stable.  Arthritis Presents for follow-up visit. She complains of pain and stiffness. She reports no joint swelling. Affected locations include the left knee, right knee, left MCP, right MCP, right hip, left hip, right shoulder and left shoulder. Her pain is at a severity of 5/10. Associated symptoms include fatigue.  Urinary Frequency  This is a chronic problem. The current episode started more than 1 year ago. The patient is experiencing no pain.  Associated symptoms include frequency. Treatments tried: oxybutynin . The treatment provided mild relief.  Insomnia Primary symptoms: sleep disturbance, difficulty falling asleep, frequent awakening, malaise/fatigue.   The current episode started more than one year. Past treatments include medication. The treatment provided moderate relief. PMH includes: depression.   Hyperlipidemia This is a chronic problem. The current episode started more than 1 year ago. The problem is controlled. Recent lipid tests were reviewed and are normal. Pertinent negatives include no shortness of breath. Current antihyperlipidemic treatment includes statins. The current treatment provides moderate improvement of lipids. Risk factors for coronary artery disease include dyslipidemia, hypertension and a sedentary lifestyle.  Anxiety Presents for follow-up visit. Symptoms include excessive worry, insomnia, nervous/anxious behavior and restlessness. Patient reports no depressed mood or shortness of breath. Symptoms occur occasionally. The severity of symptoms is moderate.    Depression        This is a chronic problem.  The current episode started more than 1 year ago.   The problem occurs intermittently.  Associated symptoms include fatigue, helplessness, insomnia, restlessness and sad.  Associated symptoms include no hopelessness.  Past medical history includes thyroid  problem and anxiety.   Back Pain This is a chronic problem. The current episode started more than 1 year ago. The problem occurs intermittently. The problem has been waxing and waning since onset. The pain is present in the lumbar spine. The quality of the pain is described as aching. The pain is at a severity of 8/10. The pain is moderate. Risk factors include obesity. She has tried bed rest and analgesics for the symptoms. The treatment provided moderate relief.   Current opioids rx- Norco  10-325 mg  # meds rx- 90 Effectiveness of current  meds-stable Adverse reactions from pain meds-n/a Morphine equivalent- 30   Pill count performed-No Last drug screen - 05/02/23 ( high risk q56m, moderate risk q68m, low risk yearly ) Urine drug screen today- Yes Was the NCCSR reviewed- Yes             If yes were their any concerning findings? - none   Pain contract signed on: 05/02/23    Review of Systems  Constitutional:  Positive for fatigue and malaise/fatigue.  Respiratory:  Negative for shortness of breath.   Gastrointestinal:  Negative for constipation.  Genitourinary:  Positive for frequency.  Musculoskeletal:  Positive for back pain and stiffness. Negative for joint swelling.  Psychiatric/Behavioral:  Positive for depression and sleep disturbance. The patient is nervous/anxious and has insomnia.   All other systems reviewed and are negative.      Objective:   Physical Exam Vitals reviewed.  Constitutional:      General: She is not in acute distress.    Appearance: She is well-developed.  HENT:     Head: Normocephalic and atraumatic.     Right Ear: Tympanic membrane normal.     Left Ear: Tympanic membrane normal.  Eyes:     Pupils: Pupils are equal, round, and reactive to light.  Neck:     Thyroid : No thyromegaly.  Cardiovascular:     Rate and Rhythm: Normal rate and regular rhythm.     Heart sounds: Normal heart sounds. No murmur heard. Pulmonary:     Effort: Pulmonary effort is normal. No respiratory distress.     Breath sounds: Normal breath sounds. No wheezing.  Abdominal:     General: Bowel sounds are normal. There is no distension.     Palpations: Abdomen is soft.     Tenderness: There is no abdominal tenderness.  Musculoskeletal:        General: Tenderness present. No deformity.     Cervical back: Normal range of motion and neck supple.     Right lower leg: Edema (trace) present.     Left lower leg: Edema (trace) present.     Comments: Right sided weakness, pain in lumbar with flexion and extension    Skin:    General: Skin is warm and dry.  Neurological:     Mental Status: She is alert and oriented to person, place, and time.     Cranial Nerves: No cranial nerve deficit.     Motor: Weakness (right sided) present.     Gait: Gait abnormal.     Deep Tendon Reflexes: Reflexes are normal and symmetric.  Psychiatric:        Behavior: Behavior normal.        Thought Content: Thought content normal.        Judgment: Judgment normal.      There were no vitals taken for this visit.     Assessment & Plan:  Byron Tipping Sahm comes in today with chief complaint of Medical Management of Chronic Issues   Diagnosis and orders addressed:  1. GAD (generalized anxiety disorder) - citalopram  (CELEXA ) 40 MG tablet; Take 1 tablet (40 mg total) by mouth daily.  Dispense: 90 tablet; Refill: 0 - CBC with Differential/Platelet - CMP14+EGFR  2. Moderate episode of recurrent major depressive disorder (HCC) - citalopram  (CELEXA ) 40 MG tablet; Take 1 tablet (40 mg total) by mouth daily.  Dispense: 90 tablet; Refill: 0 - CBC with Differential/Platelet - CMP14+EGFR  3. Mixed  hyperlipidemia (Primary) - CBC with Differential/Platelet - CMP14+EGFR - Lipid panel  4. Essential hypertension, benign - CBC with Differential/Platelet - CMP14+EGFR  5. Late effect of cerebrovascular accident - CBC with Differential/Platelet - CMP14+EGFR  6. Insomnia, unspecified type - CBC with Differential/Platelet - CMP14+EGFR  7. Overactive bladder Will try detrol  4 mg to see if coverage is better - CBC with Differential/Platelet - CMP14+EGFR - tolterodine  (DETROL  LA) 4 MG 24 hr capsule; Take 1 capsule (4 mg total) by mouth daily.  Dispense: 90 capsule; Refill: 1  8. Hypothyroidism, unspecified type - CBC with Differential/Platelet - CMP14+EGFR - TSH  9. Obesity (BMI 30-39.9) - CBC with Differential/Platelet - CMP14+EGFR  10. Uncomplicated opioid dependence (HCC) - HYDROcodone -acetaminophen  (NORCO)  10-325 MG tablet; Take 1 tablet by mouth every 6 (six) hours as needed.  Dispense: 90 tablet; Refill: 0 - HYDROcodone -acetaminophen  (NORCO) 10-325 MG tablet; Take 1 tablet by mouth every 6 (six) hours as needed.  Dispense: 90 tablet; Refill: 0 - HYDROcodone -acetaminophen  (NORCO) 10-325 MG tablet; Take 1 tablet by mouth every 6 (six) hours as needed.  Dispense: 90 tablet; Refill: 0 - CBC with Differential/Platelet - CMP14+EGFR  11. Chronic bilateral low back pain with left-sided sciatica - HYDROcodone -acetaminophen  (NORCO) 10-325 MG tablet; Take 1 tablet by mouth every 6 (six) hours as needed.  Dispense: 90 tablet; Refill: 0 - HYDROcodone -acetaminophen  (NORCO) 10-325 MG tablet; Take 1 tablet by mouth every 6 (six) hours as needed.  Dispense: 90 tablet; Refill: 0 - HYDROcodone -acetaminophen  (NORCO) 10-325 MG tablet; Take 1 tablet by mouth every 6 (six) hours as needed.  Dispense: 90 tablet; Refill: 0 - CBC with Differential/Platelet - CMP14+EGFR  12. Pain medication agreement signed - HYDROcodone -acetaminophen  (NORCO) 10-325 MG tablet; Take 1 tablet by mouth every 6 (six) hours as needed.  Dispense: 90 tablet; Refill: 0 - HYDROcodone -acetaminophen  (NORCO) 10-325 MG tablet; Take 1 tablet by mouth every 6 (six) hours as needed.  Dispense: 90 tablet; Refill: 0 - HYDROcodone -acetaminophen  (NORCO) 10-325 MG tablet; Take 1 tablet by mouth every 6 (six) hours as needed.  Dispense: 90 tablet; Refill: 0 - CBC with Differential/Platelet - CMP14+EGFR  13. Osteoarthritis of right knee, unspecified osteoarthritis type - HYDROcodone -acetaminophen  (NORCO) 10-325 MG tablet; Take 1 tablet by mouth every 6 (six) hours as needed.  Dispense: 90 tablet; Refill: 0 - HYDROcodone -acetaminophen  (NORCO) 10-325 MG tablet; Take 1 tablet by mouth every 6 (six) hours as needed.  Dispense: 90 tablet; Refill: 0 - HYDROcodone -acetaminophen  (NORCO) 10-325 MG tablet; Take 1 tablet by mouth every 6 (six) hours as needed.   Dispense: 90 tablet; Refill: 0 - CBC with Differential/Platelet - CMP14+EGFR  Labs pending Will stop oxybutynin  and start detrol  4 mg Continue current medications Patient reviewed in [place] controlled database, no flags noted. Contract and drug screen are up to date.  Health Maintenance reviewed Diet and exercise encouraged  Follow up plan: 3 months    Bari Learn, FNP

## 2023-11-05 LAB — CBC WITH DIFFERENTIAL/PLATELET
Basophils Absolute: 0 10*3/uL (ref 0.0–0.2)
Basos: 1 %
EOS (ABSOLUTE): 0.2 10*3/uL (ref 0.0–0.4)
Eos: 4 %
Hematocrit: 41.5 % (ref 34.0–46.6)
Hemoglobin: 13.4 g/dL (ref 11.1–15.9)
Immature Grans (Abs): 0 10*3/uL (ref 0.0–0.1)
Immature Granulocytes: 0 %
Lymphocytes Absolute: 1.2 10*3/uL (ref 0.7–3.1)
Lymphs: 22 %
MCH: 30.9 pg (ref 26.6–33.0)
MCHC: 32.3 g/dL (ref 31.5–35.7)
MCV: 96 fL (ref 79–97)
Monocytes Absolute: 0.6 10*3/uL (ref 0.1–0.9)
Monocytes: 11 %
Neutrophils Absolute: 3.3 10*3/uL (ref 1.4–7.0)
Neutrophils: 62 %
Platelets: 238 10*3/uL (ref 150–450)
RBC: 4.33 x10E6/uL (ref 3.77–5.28)
RDW: 12.5 % (ref 11.7–15.4)
WBC: 5.4 10*3/uL (ref 3.4–10.8)

## 2023-11-05 LAB — CMP14+EGFR
ALT: 19 [IU]/L (ref 0–32)
AST: 26 [IU]/L (ref 0–40)
Albumin: 4.4 g/dL (ref 3.8–4.8)
Alkaline Phosphatase: 116 [IU]/L (ref 44–121)
BUN/Creatinine Ratio: 25 (ref 12–28)
BUN: 21 mg/dL (ref 8–27)
Bilirubin Total: 0.4 mg/dL (ref 0.0–1.2)
CO2: 22 mmol/L (ref 20–29)
Calcium: 9.9 mg/dL (ref 8.7–10.3)
Chloride: 101 mmol/L (ref 96–106)
Creatinine, Ser: 0.85 mg/dL (ref 0.57–1.00)
Globulin, Total: 2.3 g/dL (ref 1.5–4.5)
Glucose: 87 mg/dL (ref 70–99)
Potassium: 4.5 mmol/L (ref 3.5–5.2)
Sodium: 142 mmol/L (ref 134–144)
Total Protein: 6.7 g/dL (ref 6.0–8.5)
eGFR: 69 mL/min/{1.73_m2} (ref >59)

## 2023-11-05 LAB — TSH: TSH: 1.29 u[IU]/mL (ref 0.450–4.500)

## 2023-11-05 LAB — LIPID PANEL
Chol/HDL Ratio: 2.2 {ratio} (ref 0.0–4.4)
Cholesterol, Total: 170 mg/dL (ref 100–199)
HDL: 78 mg/dL (ref >39)
LDL Chol Calc (NIH): 77 mg/dL (ref 0–99)
Triglycerides: 84 mg/dL (ref 0–149)
VLDL Cholesterol Cal: 15 mg/dL (ref 5–40)

## 2023-11-25 ENCOUNTER — Other Ambulatory Visit: Payer: Self-pay | Admitting: Family

## 2023-11-25 DIAGNOSIS — G890 Central pain syndrome: Secondary | ICD-10-CM

## 2023-11-25 DIAGNOSIS — G8929 Other chronic pain: Secondary | ICD-10-CM

## 2024-01-12 ENCOUNTER — Other Ambulatory Visit: Payer: Self-pay | Admitting: Family

## 2024-01-12 DIAGNOSIS — E782 Mixed hyperlipidemia: Secondary | ICD-10-CM

## 2024-01-28 ENCOUNTER — Other Ambulatory Visit: Payer: Self-pay | Admitting: Family

## 2024-01-28 DIAGNOSIS — G8929 Other chronic pain: Secondary | ICD-10-CM

## 2024-01-28 DIAGNOSIS — G890 Central pain syndrome: Secondary | ICD-10-CM

## 2024-02-01 ENCOUNTER — Other Ambulatory Visit: Payer: Self-pay | Admitting: Family

## 2024-02-01 DIAGNOSIS — E039 Hypothyroidism, unspecified: Secondary | ICD-10-CM

## 2024-02-02 ENCOUNTER — Ambulatory Visit: Payer: Medicare Other | Admitting: Family

## 2024-02-02 VITALS — BP 135/66 | HR 83 | Temp 97.0°F | Ht 62.0 in | Wt 188.6 lb

## 2024-02-02 DIAGNOSIS — I10 Essential (primary) hypertension: Secondary | ICD-10-CM

## 2024-02-02 DIAGNOSIS — N3281 Overactive bladder: Secondary | ICD-10-CM

## 2024-02-02 DIAGNOSIS — E782 Mixed hyperlipidemia: Secondary | ICD-10-CM | POA: Diagnosis not present

## 2024-02-02 DIAGNOSIS — G8929 Other chronic pain: Secondary | ICD-10-CM

## 2024-02-02 DIAGNOSIS — F1120 Opioid dependence, uncomplicated: Secondary | ICD-10-CM

## 2024-02-02 DIAGNOSIS — F411 Generalized anxiety disorder: Secondary | ICD-10-CM | POA: Diagnosis not present

## 2024-02-02 DIAGNOSIS — Z0289 Encounter for other administrative examinations: Secondary | ICD-10-CM

## 2024-02-02 DIAGNOSIS — E669 Obesity, unspecified: Secondary | ICD-10-CM

## 2024-02-02 DIAGNOSIS — F331 Major depressive disorder, recurrent, moderate: Secondary | ICD-10-CM

## 2024-02-02 DIAGNOSIS — G890 Central pain syndrome: Secondary | ICD-10-CM

## 2024-02-02 DIAGNOSIS — I6930 Unspecified sequelae of cerebral infarction: Secondary | ICD-10-CM | POA: Diagnosis not present

## 2024-02-02 DIAGNOSIS — G4700 Insomnia, unspecified: Secondary | ICD-10-CM

## 2024-02-02 DIAGNOSIS — M1711 Unilateral primary osteoarthritis, right knee: Secondary | ICD-10-CM

## 2024-02-02 DIAGNOSIS — M5442 Lumbago with sciatica, left side: Secondary | ICD-10-CM

## 2024-02-02 MED ORDER — HYDROCODONE-ACETAMINOPHEN 10-325 MG PO TABS
1.0000 | ORAL_TABLET | Freq: Four times a day (QID) | ORAL | 0 refills | Status: DC | PRN
Start: 2024-02-02 — End: 2024-05-02

## 2024-02-02 MED ORDER — CITALOPRAM HYDROBROMIDE 40 MG PO TABS: 40.0000 mg | ORAL_TABLET | Freq: Every day | ORAL | 0 refills | Status: DC

## 2024-02-02 MED ORDER — TOLTERODINE TARTRATE ER 4 MG PO CP24: 4.0000 mg | ORAL_CAPSULE | Freq: Every day | ORAL | 1 refills | Status: DC

## 2024-02-02 MED ORDER — MELOXICAM 7.5 MG PO TABS: 7.5000 mg | ORAL_TABLET | Freq: Every day | ORAL | 0 refills | Status: DC

## 2024-02-02 MED ORDER — GABAPENTIN 100 MG PO CAPS
ORAL_CAPSULE | ORAL | 2 refills | Status: DC
Start: 2024-02-02 — End: 2024-04-23

## 2024-02-02 MED ORDER — HYDROCODONE-ACETAMINOPHEN 10-325 MG PO TABS: 1.0000 | ORAL_TABLET | Freq: Four times a day (QID) | ORAL | 0 refills | Status: DC | PRN

## 2024-02-02 MED ORDER — ATORVASTATIN CALCIUM 20 MG PO TABS: 20.0000 mg | ORAL_TABLET | Freq: Every day | ORAL | 3 refills | Status: DC

## 2024-02-02 MED ORDER — AMLODIPINE BESYLATE 5 MG PO TABS: 5.0000 mg | ORAL_TABLET | Freq: Every day | ORAL | 0 refills | Status: DC

## 2024-02-02 MED ORDER — HYDROCODONE-ACETAMINOPHEN 10-325 MG PO TABS
1.0000 | ORAL_TABLET | Freq: Four times a day (QID) | ORAL | 0 refills | Status: DC | PRN
Start: 2024-03-30 — End: 2024-05-02

## 2024-02-02 NOTE — Progress Notes (Signed)
 Subjective:    Patient ID: Allison Parker, female    DOB: 01-29-1941, 83 y.o.   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 PT states this is stable at this time.  Pt is followed by Ortho annually for arthritis.   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 Her pain has greatly improved.    Reports she is having urinary frequency and urgency. This was improving when taking vesicare, but insurance would not cover. She has been taking oxybutynin 15 mg with relief, but having dry mouth. She is taking detrol 4 mg that is helping.   Hypertension This is a chronic problem. The current episode started more than 1 year ago. The problem has been resolved since onset. The problem is controlled. Associated symptoms include anxiety, malaise/fatigue and peripheral edema. Pertinent negatives include no shortness of breath. Risk factors for coronary artery disease include dyslipidemia and sedentary lifestyle. The current treatment provides moderate improvement. Hypertensive end-organ damage includes CVA. Identifiable causes of hypertension include a thyroid problem.  Thyroid Problem Presents for follow-up visit. Symptoms include anxiety, dry skin and fatigue. Patient reports no constipation, depressed mood or diarrhea. The symptoms have been stable.  Arthritis Presents for follow-up visit. She complains of pain and stiffness. She reports no joint swelling. Affected locations include the left knee, right knee, left MCP, right MCP, right hip, left hip, right shoulder and left shoulder. Her pain is at a severity of 6/10. Associated symptoms include fatigue. Pertinent negatives include no diarrhea.  Urinary Frequency  This is a chronic problem.  The current episode started more than 1 year ago. The patient is experiencing no pain. Associated symptoms include frequency. Treatments tried: detrol. The treatment provided moderate relief.  Insomnia Primary symptoms: sleep disturbance, difficulty falling asleep, frequent awakening, malaise/fatigue.   The current episode started more than one year. Past treatments include medication. The treatment provided moderate relief. PMH includes: depression.   Hyperlipidemia This is a chronic problem. The current episode started more than 1 year ago. The problem is controlled. Recent lipid tests were reviewed and are normal. Exacerbating diseases include obesity. Pertinent negatives include no shortness of breath. Current antihyperlipidemic treatment includes statins. The current treatment provides moderate improvement of lipids. Risk factors for coronary artery disease include dyslipidemia, hypertension and a sedentary lifestyle.  Anxiety Presents for follow-up visit. Symptoms include excessive worry, insomnia, nervous/anxious behavior and restlessness. Patient reports no depressed mood or shortness of breath. Symptoms occur occasionally. The severity of symptoms is moderate.    Depression        This is a chronic problem.  The current episode started more than 1 year ago.   The problem occurs intermittently.  Associated symptoms include fatigue, insomnia, restlessness and sad.  Associated symptoms include no helplessness and no hopelessness.  Past treatments include SSRIs - Selective serotonin reuptake inhibitors.  Past medical history includes thyroid problem and anxiety.   Back Pain This is a chronic problem. The current episode started more than 1 year ago. The problem occurs intermittently. The problem has been waxing and waning since onset. The pain is present in the lumbar spine. The quality of the pain is described as aching. The pain is at a severity  of 7/10. The pain is moderate. Risk factors include  obesity. She has tried bed rest and analgesics for the symptoms. The treatment provided moderate relief.   Current opioids rx- Norco 10-325 mg  # meds rx- 90 Effectiveness of current meds-stable Adverse reactions from pain meds-n/a Morphine equivalent- 30   Pill count performed-No Last drug screen - 05/02/23 ( high risk q14m, moderate risk q4m, low risk yearly ) Urine drug screen today- Yes Was the NCCSR reviewed- Yes             If yes were their any concerning findings? - none   Pain contract signed on: 05/02/23    Review of Systems  Constitutional:  Positive for fatigue and malaise/fatigue.  Respiratory:  Negative for shortness of breath.   Gastrointestinal:  Negative for constipation and diarrhea.  Genitourinary:  Positive for frequency.  Musculoskeletal:  Positive for back pain and stiffness. Negative for joint swelling.  Psychiatric/Behavioral:  Positive for depression and sleep disturbance. The patient is nervous/anxious and has insomnia.   All other systems reviewed and are negative.  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 One child passed away. One son lives next door.   Social Drivers of Corporate investment banker Strain: Low Risk  (02/02/2024)   Overall Financial Resource Strain (CARDIA)    Difficulty of Paying Living Expenses: Not very hard  Food Insecurity: Patient Declined (02/02/2024)   Hunger Vital Sign    Worried About Running Out of Food in the Last Year: Patient declined    Ran Out of Food in the Last Year: Patient declined  Transportation Needs: No Transportation Needs (02/02/2024)   PRAPARE - Administrator, Civil Service (Medical): No    Lack of Transportation (Non-Medical): No  Physical Activity: Unknown (02/02/2024)   Exercise Vital Sign    Days of Exercise per Week: Patient declined    Minutes of Exercise per Session: 30 min  Stress: Stress Concern Present (02/02/2024)   [employer] of Occupational Health - Occupational Stress Questionnaire    Feeling of Stress : To some extent  Social Connections: Socially Integrated (02/02/2024)   Social Connection and Isolation Panel [NHANES]    Frequency of Communication with Friends  and Family: More than three times a week    Frequency of Social Gatherings with Friends and Family: Patient declined    Attends Religious Services: More than 4 times per year    Active Member of [employer] or Organizations: Yes    Attends Engineer, structural: More than 4 times per year    Marital Status: Married       Objective:   Physical Exam Vitals reviewed.  Constitutional:      General: She is not in acute distress.    Appearance: She is well-developed.  HENT:     Head: Normocephalic and atraumatic.     Right Ear: Tympanic membrane normal.     Left Ear: Tympanic membrane normal.  Eyes:     Pupils: Pupils are equal, round, and reactive to light.  Neck:     Thyroid: No thyromegaly.  Cardiovascular:     Rate and Rhythm: Normal  rate and regular rhythm.     Heart sounds: Normal heart sounds. No murmur heard. Pulmonary:     Effort: Pulmonary effort is normal. No respiratory distress.     Breath sounds: Normal breath sounds. No wheezing.  Abdominal:     General: Bowel sounds are normal. There is no distension.     Palpations: Abdomen is soft.     Tenderness: There is no abdominal tenderness.  Musculoskeletal:        General: Tenderness present. No deformity.     Cervical back: Normal range of motion and neck supple.     Right lower leg: Edema (trace) present.     Left lower leg: No edema.     Comments: Right sided weakness, pain in lumbar with flexion and extension   Skin:    General: Skin is warm and dry.  Neurological:     Mental Status: She is alert and oriented to person, place, and time.     Cranial Nerves: No cranial nerve deficit.     Motor: Weakness (right sided) present.     Gait: Gait abnormal.     Deep Tendon Reflexes: Reflexes are normal and symmetric.  Psychiatric:        Behavior: Behavior normal.        Thought Content: Thought content normal.        Judgment: Judgment normal.      BP 135/66   Pulse 83   Temp (!) 97 F (36.1 C) (Temporal)   Ht 5\' 2"  (1.575 m)   Wt 188 lb 9.6 oz (85.5 kg)   SpO2 96%   BMI 34.50 kg/m      Assessment & Plan:  Eisha Chatterjee Pistole comes in today with chief complaint of Medical Management of Chronic Issues   Diagnosis and orders addressed:  1. Essential hypertension, benign - amLODipine (NORVASC) 5 MG tablet; Take 1 tablet (5 mg total) by mouth daily.  Dispense: 90 tablet; Refill: 0  2. Mixed hyperlipidemia - atorvastatin (LIPITOR) 20 MG tablet; Take 1 tablet (20 mg total) by mouth daily.  Dispense: 90 tablet; Refill: 3  3. GAD (generalized anxiety disorder) - citalopram (CELEXA) 40 MG tablet; Take 1 tablet (40 mg total) by mouth daily.  Dispense: 90 tablet; Refill: 0  4. Moderate episode of recurrent major depressive disorder (HCC) - citalopram  (CELEXA) 40 MG tablet; Take 1 tablet (40 mg total) by mouth daily.  Dispense: 90 tablet; Refill: 0  5. Thalamic pain syndrome - gabapentin (NEURONTIN) 100 MG capsule; TAKE 1 CAPSULE BY MOUTH EVERY MORNING AND  6 CAPSULES EVERY EVENING  Dispense: 150 capsule; Refill: 2 - meloxicam (MOBIC) 7.5 MG tablet; Take 1 tablet (7.5 mg total) by mouth daily.  Dispense: 90 tablet; Refill: 0  6. Chronic bilateral low back pain with left-sided sciatica - gabapentin (NEURONTIN) 100 MG capsule; TAKE 1 CAPSULE BY MOUTH EVERY MORNING AND 6 CAPSULES EVERY EVENING  Dispense: 150 capsule; Refill: 2 - HYDROcodone-acetaminophen (NORCO) 10-325 MG tablet; Take 1 tablet by mouth every 6 (six) hours as needed.  Dispense: 90 tablet; Refill: 0 - HYDROcodone-acetaminophen (NORCO) 10-325 MG tablet; Take 1 tablet by mouth every 6 (six) hours as needed.  Dispense: 90 tablet; Refill: 0 - HYDROcodone-acetaminophen (NORCO) 10-325 MG tablet; Take 1 tablet by mouth every 6 (six) hours as needed.  Dispense: 90 tablet; Refill: 0 - meloxicam (MOBIC) 7.5 MG tablet; Take 1 tablet (7.5 mg total) by mouth daily.  Dispense: 90 tablet; Refill: 0  7. Uncomplicated opioid dependence (HCC) - HYDROcodone-acetaminophen (NORCO) 10-325 MG tablet; Take 1 tablet by mouth every 6 (six) hours as needed.  Dispense: 90 tablet; Refill: 0 - HYDROcodone-acetaminophen (NORCO) 10-325 MG tablet; Take 1 tablet by mouth every 6 (six) hours as needed.  Dispense: 90 tablet; Refill: 0 - HYDROcodone-acetaminophen (NORCO) 10-325 MG tablet; Take 1 tablet by mouth every 6 (six) hours as needed.  Dispense: 90 tablet; Refill: 0  8. Pain medication agreement signed - HYDROcodone-acetaminophen (NORCO) 10-325 MG tablet; Take 1 tablet by mouth every 6 (six) hours as needed.  Dispense: 90 tablet; Refill: 0 - HYDROcodone-acetaminophen (NORCO) 10-325 MG tablet; Take 1 tablet by mouth every 6 (six) hours as needed.  Dispense: 90 tablet; Refill: 0 - HYDROcodone-acetaminophen  (NORCO) 10-325 MG tablet; Take 1 tablet by mouth every 6 (six) hours as needed.  Dispense: 90 tablet; Refill: 0  9. Osteoarthritis of right knee, unspecified osteoarthritis type - HYDROcodone-acetaminophen (NORCO) 10-325 MG tablet; Take 1 tablet by mouth every 6 (six) hours as needed.  Dispense: 90 tablet; Refill: 0 - HYDROcodone-acetaminophen (NORCO) 10-325 MG tablet; Take 1 tablet by mouth every 6 (six) hours as needed.  Dispense: 90 tablet; Refill: 0 - HYDROcodone-acetaminophen (NORCO) 10-325 MG tablet; Take 1 tablet by mouth every 6 (six) hours as needed.  Dispense: 90 tablet; Refill: 0  10. Overactive bladder  - tolterodine (DETROL LA) 4 MG 24 hr capsule; Take 1 capsule (4 mg total) by mouth daily.  Dispense: 90 capsule; Refill: 1  11. Late effect of cerebrovascular accident (Primary)  12. Insomnia, unspecified type  13. Obesity (BMI 30-39.9)   Labs reviewed  Continue current medications Patient reviewed in [place] controlled database, no flags noted. Contract and drug screen are up to date.  Health Maintenance reviewed Diet and exercise encouraged  Follow up plan: 3 months    Jannifer Rodney, FNP

## 2024-02-02 NOTE — Patient Instructions (Signed)
 Health Maintenance After Age 81 After age 4, you are at a higher risk for certain long-term diseases and infections as well as injuries from falls. Falls are a major cause of broken bones and head injuries in people who are older than age 47. Getting regular preventive care can help to keep you healthy and well. Preventive care includes getting regular testing and making lifestyle changes as recommended by your health care provider. Talk with your health care provider about: Which screenings and tests you should have. A screening is a test that checks for a disease when you have no symptoms. A diet and exercise plan that is right for you. What should I know about screenings and tests to prevent falls? Screening and testing are the best ways to find a health problem early. Early diagnosis and treatment give you the best chance of managing medical conditions that are common after age 37. Certain conditions and lifestyle choices may make you more likely to have a fall. Your health care provider may recommend: Regular vision checks. Poor vision and conditions such as cataracts can make you more likely to have a fall. If you wear glasses, make sure to get your prescription updated if your vision changes. Medicine review. Work with your health care provider to regularly review all of the medicines you are taking, including over-the-counter medicines. Ask your health care provider about any side effects that may make you more likely to have a fall. Tell your health care provider if any medicines that you take make you feel dizzy or sleepy. Strength and balance checks. Your health care provider may recommend certain tests to check your strength and balance while standing, walking, or changing positions. Foot health exam. Foot pain and numbness, as well as not wearing proper footwear, can make you more likely to have a fall. Screenings, including: Osteoporosis screening. Osteoporosis is a condition that causes  the bones to get weaker and break more easily. Blood pressure screening. Blood pressure changes and medicines to control blood pressure can make you feel dizzy. Depression screening. You may be more likely to have a fall if you have a fear of falling, feel depressed, or feel unable to do activities that you used to do. Alcohol use screening. Using too much alcohol can affect your balance and may make you more likely to have a fall. Follow these instructions at home: Lifestyle Do not drink alcohol if: Your health care provider tells you not to drink. If you drink alcohol: Limit how much you have to: 0-1 drink a day for women. 0-2 drinks a day for men. Know how much alcohol is in your drink. In the [place] one drink equals one 12 oz bottle of beer (355 mL), one 5 oz glass of wine (148 mL), or one 1 oz glass of hard liquor (44 mL). Do not use any products that contain nicotine or tobacco. These products include cigarettes, chewing tobacco, and vaping devices, such as e-cigarettes. If you need help quitting, ask your health care provider. Activity  Follow a regular exercise program to stay fit. This will help you maintain your balance. Ask your health care provider what types of exercise are appropriate for you. If you need a cane or walker, use it as recommended by your health care provider. Wear supportive shoes that have nonskid soles. Safety  Remove any tripping hazards, such as rugs, cords, and clutter. Install safety equipment such as grab bars in bathrooms and safety rails on stairs. Keep rooms and walkways  well-lit. General instructions Talk with your health care provider about your risks for falling. Tell your health care provider if: You fall. Be sure to tell your health care provider about all falls, even ones that seem minor. You feel dizzy, tiredness (fatigue), or off-balance. Take over-the-counter and prescription medicines only as told by your health care provider. These include  supplements. Eat a healthy diet and maintain a healthy weight. A healthy diet includes low-fat dairy products, low-fat (lean) meats, and fiber from whole grains, beans, and lots of fruits and vegetables. Stay current with your vaccines. Schedule regular health, dental, and eye exams. Summary Having a healthy lifestyle and getting preventive care can help to protect your health and wellness after age 11. Screening and testing are the best way to find a health problem early and help you avoid having a fall. Early diagnosis and treatment give you the best chance for managing medical conditions that are more common for people who are older than age 28. Falls are a major cause of broken bones and head injuries in people who are older than age 48. Take precautions to prevent a fall at home. Work with your health care provider to learn what changes you can make to improve your health and wellness and to prevent falls. This information is not intended to replace advice given to you by your health care provider. Make sure you discuss any questions you have with your health care provider. Document Revised: 03/02/2021 Document Reviewed: 03/02/2021 Elsevier Patient Education  2024 [employer].

## 2024-04-22 ENCOUNTER — Other Ambulatory Visit: Payer: Self-pay | Admitting: Family

## 2024-04-22 DIAGNOSIS — G890 Central pain syndrome: Secondary | ICD-10-CM

## 2024-04-22 DIAGNOSIS — G8929 Other chronic pain: Secondary | ICD-10-CM

## 2024-05-04 ENCOUNTER — Ambulatory Visit (INDEPENDENT_AMBULATORY_CARE_PROVIDER_SITE_OTHER): Admitting: Family

## 2024-05-04 VITALS — BP 124/74 | HR 82 | Temp 98.4°F | Wt 188.0 lb

## 2024-05-04 DIAGNOSIS — F411 Generalized anxiety disorder: Secondary | ICD-10-CM

## 2024-05-04 DIAGNOSIS — G890 Central pain syndrome: Secondary | ICD-10-CM | POA: Diagnosis not present

## 2024-05-04 DIAGNOSIS — G4700 Insomnia, unspecified: Secondary | ICD-10-CM

## 2024-05-04 DIAGNOSIS — F331 Major depressive disorder, recurrent, moderate: Secondary | ICD-10-CM | POA: Diagnosis not present

## 2024-05-04 DIAGNOSIS — B372 Candidiasis of skin and nail: Secondary | ICD-10-CM

## 2024-05-04 DIAGNOSIS — E039 Hypothyroidism, unspecified: Secondary | ICD-10-CM

## 2024-05-04 DIAGNOSIS — I6930 Unspecified sequelae of cerebral infarction: Secondary | ICD-10-CM

## 2024-05-04 DIAGNOSIS — Z0289 Encounter for other administrative examinations: Secondary | ICD-10-CM

## 2024-05-04 DIAGNOSIS — N3281 Overactive bladder: Secondary | ICD-10-CM

## 2024-05-04 DIAGNOSIS — E669 Obesity, unspecified: Secondary | ICD-10-CM

## 2024-05-04 DIAGNOSIS — G8929 Other chronic pain: Secondary | ICD-10-CM

## 2024-05-04 DIAGNOSIS — I10 Essential (primary) hypertension: Secondary | ICD-10-CM

## 2024-05-04 DIAGNOSIS — F1120 Opioid dependence, uncomplicated: Secondary | ICD-10-CM

## 2024-05-04 DIAGNOSIS — M1711 Unilateral primary osteoarthritis, right knee: Secondary | ICD-10-CM

## 2024-05-04 DIAGNOSIS — E782 Mixed hyperlipidemia: Secondary | ICD-10-CM

## 2024-05-04 MED ORDER — CITALOPRAM HYDROBROMIDE 40 MG PO TABS: 40.0000 mg | ORAL_TABLET | Freq: Every day | ORAL | 0 refills | Status: DC

## 2024-05-04 MED ORDER — GABAPENTIN 100 MG PO CAPS: ORAL_CAPSULE | ORAL | 0 refills | Status: DC

## 2024-05-04 MED ORDER — MELOXICAM 7.5 MG PO TABS: 7.5000 mg | ORAL_TABLET | Freq: Every day | ORAL | 0 refills | Status: DC

## 2024-05-04 MED ORDER — HYDROCODONE-ACETAMINOPHEN 10-325 MG PO TABS: 1.0000 | ORAL_TABLET | Freq: Four times a day (QID) | ORAL | 0 refills | Status: DC | PRN

## 2024-05-04 MED ORDER — NYSTATIN 100000 UNIT/GM EX CREA: TOPICAL_CREAM | Freq: Three times a day (TID) | CUTANEOUS | 2 refills | Status: DC

## 2024-05-04 MED ORDER — TROSPIUM CHLORIDE 20 MG PO TABS: 20.0000 mg | ORAL_TABLET | Freq: Two times a day (BID) | ORAL | 2 refills | Status: AC

## 2024-05-04 MED ORDER — NYSTATIN 100000 UNIT/GM EX OINT: TOPICAL_OINTMENT | Freq: Two times a day (BID) | CUTANEOUS | 2 refills | Status: DC

## 2024-05-04 MED ORDER — AMLODIPINE BESYLATE 5 MG PO TABS
5.0000 mg | ORAL_TABLET | Freq: Every day | ORAL | 0 refills | Status: DC
Start: 2024-05-04 — End: 2024-07-04

## 2024-05-04 NOTE — Progress Notes (Signed)
 Subjective:    Patient ID: Allison Parker, female    DOB: 1940/11/17, 83 y.o.   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 PT states this is stable at this time.  Pt is followed by Ortho as needed.   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 Her pain has greatly improved.    Reports she is having urinary frequency and urgency. This was improving when taking vesicare , but insurance would not cover. She has been taking oxybutynin  15 mg with relief, but having dry mouth. She is taking detrol  4 mg and states this is not helping.   Hypertension This is a chronic problem. The current episode started more than 1 year ago. The problem has been resolved since onset. The problem is controlled. Associated symptoms include anxiety, malaise/fatigue and peripheral edema. Pertinent negatives include no shortness of breath. Risk factors for coronary artery disease include dyslipidemia and sedentary lifestyle. The current treatment provides moderate improvement. Hypertensive end-organ damage includes CVA. Identifiable causes of hypertension include a thyroid  problem.  Thyroid  Problem Presents for follow-up visit. Symptoms include anxiety, dry skin and fatigue. Patient reports no constipation, depressed mood or diarrhea. The symptoms have been stable.  Arthritis Presents for follow-up visit. She complains of pain and stiffness. She reports no joint swelling. Affected locations include the left knee, right knee, left MCP, right MCP, right hip, left hip, right shoulder and left shoulder. Her pain is at a severity of 6/10. Associated symptoms include fatigue. Pertinent negatives include no diarrhea.  Urinary Frequency  This is a chronic  problem. The current episode started more than 1 year ago. The patient is experiencing no pain. Associated symptoms include frequency. Treatments tried: detrol . The treatment provided moderate relief.  Insomnia Primary symptoms: sleep disturbance, difficulty falling asleep, frequent awakening, malaise/fatigue.   The current episode started more than one year. Past treatments include medication. The treatment provided moderate relief. PMH includes: depression.   Hyperlipidemia This is a chronic problem. The current episode started more than 1 year ago. The problem is controlled. Recent lipid tests were reviewed and are normal. Exacerbating diseases include obesity. Pertinent negatives include no shortness of breath. Current antihyperlipidemic treatment includes statins. The current treatment provides moderate improvement of lipids. Risk factors for coronary artery disease include dyslipidemia, hypertension and a sedentary lifestyle.  Anxiety Presents for follow-up visit. Symptoms include excessive worry, insomnia, nervous/anxious behavior and restlessness. Patient reports no depressed mood or shortness of breath. Symptoms occur occasionally. The severity of symptoms is moderate.    Depression        This is a chronic problem.  The current episode started more than 1 year ago.   The problem occurs intermittently.  Associated symptoms include fatigue, insomnia, restlessness and sad.  Associated symptoms include no helplessness and no hopelessness.  Past treatments include SSRIs - Selective serotonin reuptake inhibitors.  Past medical history includes thyroid  problem and anxiety.   Back Pain This is a chronic problem. The current episode started more than 1 year ago. The problem occurs intermittently. The problem has been waxing and waning since onset. The pain is present in the lumbar spine. The quality of the pain is described as aching. The pain  is at a severity of 6/10. The pain is moderate. Risk  factors include obesity. She has tried bed rest and analgesics for the symptoms. The treatment provided moderate relief.   Current opioids rx- Norco 10-325 mg  # meds rx- 90 Effectiveness of current meds-stable Adverse reactions from pain meds-n/a Morphine equivalent- 30   Pill count performed-No Last drug screen - 05/04/24 ( high risk q36m, moderate risk q40m, low risk yearly ) Urine drug screen today- Yes Was the [facility] reviewed- Yes             If yes were their any concerning findings? - none   Pain contract signed on: 05/04/24    Review of Systems  Constitutional:  Positive for fatigue and malaise/fatigue.  Respiratory:  Negative for shortness of breath.   Gastrointestinal:  Negative for constipation and diarrhea.  Genitourinary:  Positive for frequency.  Musculoskeletal:  Positive for back pain and stiffness. Negative for joint swelling.  Psychiatric/Behavioral:  Positive for depression and sleep disturbance. The patient is nervous/anxious and has insomnia.   All other systems reviewed and are negative.  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 One child passed away. One son lives next door.   Social Drivers of Health   Financial Resource Strain: Medium Risk (05/04/2024)   Overall Financial Resource Strain (CARDIA)    Difficulty of Paying Living Expenses: Somewhat hard  Food Insecurity: No Food Insecurity (05/04/2024)   Hunger Vital Sign    Worried About Running Out of Food in the Last Year: Never true    Ran Out of Food in the Last Year: Never true  Transportation Needs: No Transportation Needs (05/04/2024)   PRAPARE - Administrator, Civil Service (Medical): No    Lack of Transportation (Non-Medical): No  Physical Activity: Insufficiently Active (05/04/2024)   Exercise Vital Sign    Days of Exercise per Week: 2 days    Minutes of Exercise per Session: 20 min  Stress: Stress Concern Present (05/04/2024)   [employer] of Occupational Health - Occupational Stress Questionnaire    Feeling of Stress: Rather much  Social Connections: Socially Integrated (05/04/2024)   Social Connection and Isolation Panel    Frequency of Communication with  Friends and Family: Three times a week    Frequency of Social Gatherings with Friends and Family: Once a week    Attends Religious Services: More than 4 times per year    Active Member of [employer] or Organizations: Yes    Attends Engineer, structural: More than 4 times per year    Marital Status: Married       Objective:   Physical Exam Vitals reviewed.  Constitutional:      General: She is not in acute distress.    Appearance: She is well-developed.  HENT:     Head: Normocephalic and atraumatic.     Right Ear: Tympanic membrane normal.     Left Ear: Tympanic membrane normal.  Eyes:     Pupils: Pupils are equal, round, and reactive to light.  Neck:     Thyroid : No thyromegaly.  Cardiovascular:     Rate and Rhythm: Normal rate and regular  rhythm.     Heart sounds: Normal heart sounds. No murmur heard. Pulmonary:     Effort: Pulmonary effort is normal. No respiratory distress.     Breath sounds: Normal breath sounds. No wheezing.  Abdominal:     General: Bowel sounds are normal. There is no distension.     Palpations: Abdomen is soft.     Tenderness: There is no abdominal tenderness.  Musculoskeletal:        General: Tenderness present. No deformity.     Cervical back: Normal range of motion and neck supple.     Right lower leg: Edema (trace) present.     Left lower leg: No edema.     Comments: Right sided weakness, pain in lumbar with flexion and extension   Skin:    General: Skin is warm and dry.  Neurological:     Mental Status: She is alert and oriented to person, place, and time.     Cranial Nerves: No cranial nerve deficit.     Motor: Weakness (right sided) present.     Gait: Gait abnormal.     Deep Tendon Reflexes: Reflexes are normal and symmetric.  Psychiatric:        Behavior: Behavior normal.        Thought Content: Thought content normal.        Judgment: Judgment normal.      BP 124/74   Pulse 82   Temp 98.4 F (36.9 C)   Wt 188 lb (85.3 kg)   SpO2 97%   BMI 34.39 kg/m      Assessment & Plan:  Allison Parker comes in today with chief complaint of Medical Management of Chronic Issues   Diagnosis and orders addressed:  1. Essential hypertension, benign (Primary) - amLODipine  (NORVASC ) 5 MG tablet; Take 1 tablet (5 mg total) by mouth daily.  Dispense: 90 tablet; Refill: 0  2. GAD (generalized anxiety disorder) - citalopram  (CELEXA ) 40 MG tablet; Take 1 tablet (40 mg total) by mouth daily.  Dispense: 90 tablet; Refill: 0  3. Moderate episode of recurrent major depressive disorder (HCC) - citalopram  (CELEXA ) 40 MG tablet; Take 1 tablet (40 mg total) by mouth daily.  Dispense: 90 tablet; Refill: 0  4. Thalamic pain syndrome  - gabapentin  (NEURONTIN ) 100 MG capsule; TAKE 1 CAPSULE BY  MOUTH EVERY MORNING AND 6 CAPSULES EVERY EVENING  Dispense: 150 capsule; Refill: 0 - meloxicam  (MOBIC ) 7.5 MG tablet; Take 1 tablet (7.5 mg total) by mouth daily.  Dispense: 90 tablet; Refill: 0  5. Chronic  bilateral low back pain with left-sided sciatica - gabapentin  (NEURONTIN ) 100 MG capsule; TAKE 1 CAPSULE BY MOUTH EVERY MORNING AND 6 CAPSULES EVERY EVENING  Dispense: 150 capsule; Refill: 0 - HYDROcodone -acetaminophen  (NORCO) 10-325 MG tablet; Take 1 tablet by mouth every 6 (six) hours as needed.  Dispense: 90 tablet; Refill: 0 - HYDROcodone -acetaminophen  (NORCO) 10-325 MG tablet; Take 1 tablet by mouth every 6 (six) hours as needed.  Dispense: 90 tablet; Refill: 0 - HYDROcodone -acetaminophen  (NORCO) 10-325 MG tablet; Take 1 tablet by mouth every 6 (six) hours as needed.  Dispense: 90 tablet; Refill: 0 - meloxicam  (MOBIC ) 7.5 MG tablet; Take 1 tablet (7.5 mg total) by mouth daily.  Dispense: 90 tablet; Refill: 0  6. Uncomplicated opioid dependence (HCC)  - HYDROcodone -acetaminophen  (NORCO) 10-325 MG tablet; Take 1 tablet by mouth every 6 (six) hours as needed.  Dispense: 90 tablet; Refill: 0 - HYDROcodone -acetaminophen  (NORCO) 10-325 MG tablet; Take 1 tablet by mouth every 6 (six) hours as needed.  Dispense: 90 tablet; Refill: 0 - HYDROcodone -acetaminophen  (NORCO) 10-325 MG tablet; Take 1 tablet by mouth every 6 (six) hours as needed.  Dispense: 90 tablet; Refill: 0  7. Pain medication agreement signed - HYDROcodone -acetaminophen  (NORCO) 10-325 MG tablet; Take 1 tablet by mouth every 6 (six) hours as needed.  Dispense: 90 tablet; Refill: 0 - HYDROcodone -acetaminophen  (NORCO) 10-325 MG tablet; Take 1 tablet by mouth every 6 (six) hours as needed.  Dispense: 90 tablet; Refill: 0 - HYDROcodone -acetaminophen  (NORCO) 10-325 MG tablet; Take 1 tablet by mouth every 6 (six) hours as needed.  Dispense: 90 tablet; Refill: 0 - ToxASSURE Select 13 (MW), Urine  8. Osteoarthritis of right knee,  unspecified osteoarthritis type - HYDROcodone -acetaminophen  (NORCO) 10-325 MG tablet; Take 1 tablet by mouth every 6 (six) hours as needed.  Dispense: 90 tablet; Refill: 0 - HYDROcodone -acetaminophen  (NORCO) 10-325 MG tablet; Take 1 tablet by mouth every 6 (six) hours as needed.  Dispense: 90 tablet; Refill: 0 - HYDROcodone -acetaminophen  (NORCO) 10-325 MG tablet; Take 1 tablet by mouth every 6 (six) hours as needed.  Dispense: 90 tablet; Refill: 0  9. Candidal skin infection  - nystatin  cream (MYCOSTATIN ); Apply topically 3 (three) times daily.  Dispense: 60 g; Refill: 2 - nystatin  ointment (MYCOSTATIN ); Apply topically 2 (two) times daily.  Dispense: 60 g; Refill: 2  10. Mixed hyperlipidemia   11. Late effect of cerebrovascular accident  39. Insomnia, unspecified type   13. Overactive bladder - Pt has tried oxybutynin  and Detrol  without relief. Could not afford vesicare  or myrbetriq . Will try [place]  20 mg BD Limit caffeine  - trospium  (SANCTURA ) 20 MG tablet; Take 1 tablet (20 mg total) by mouth 2 (two) times daily.  Dispense: 180 tablet; Refill: 2  14. Hypothyroidism, unspecified type  15. Obesity (BMI 30-39.9)    Labs reviewed  Continue current medications Patient reviewed in [place] controlled database, no flags noted. Contract and drug screen are up to date.  Health Maintenance reviewed Diet and exercise encouraged  Follow up plan: 3 months    Bari Learn, FNP

## 2024-05-04 NOTE — Patient Instructions (Signed)

## 2024-05-05 LAB — CMP14+EGFR
ALT: 45 IU/L — ABNORMAL HIGH (ref 0–32)
AST: 32 IU/L (ref 0–40)
Albumin: 4.3 g/dL (ref 3.8–4.8)
Alkaline Phosphatase: 132 IU/L — ABNORMAL HIGH (ref 44–121)
BUN/Creatinine Ratio: 29 — ABNORMAL HIGH (ref 12–28)
BUN: 22 mg/dL (ref 8–27)
Bilirubin Total: 0.3 mg/dL (ref 0.0–1.2)
CO2: 21 mmol/L (ref 20–29)
Calcium: 9.5 mg/dL (ref 8.7–10.3)
Chloride: 100 mmol/L (ref 96–106)
Creatinine, Ser: 0.77 mg/dL (ref 0.57–1.00)
Globulin, Total: 2.3 g/dL (ref 1.5–4.5)
Glucose: 90 mg/dL (ref 70–99)
Potassium: 4.7 mmol/L (ref 3.5–5.2)
Sodium: 140 mmol/L (ref 134–144)
Total Protein: 6.6 g/dL (ref 6.0–8.5)
eGFR: 78 mL/min/1.73 (ref >59)

## 2024-05-07 ENCOUNTER — Ambulatory Visit: Payer: Self-pay | Admitting: Family

## 2024-05-07 LAB — TOXASSURE SELECT 13 (MW), URINE

## 2024-06-08 ENCOUNTER — Other Ambulatory Visit: Payer: Self-pay | Admitting: Family

## 2024-06-08 DIAGNOSIS — G890 Central pain syndrome: Secondary | ICD-10-CM

## 2024-06-08 DIAGNOSIS — G8929 Other chronic pain: Secondary | ICD-10-CM

## 2024-06-14 ENCOUNTER — Ambulatory Visit (INDEPENDENT_AMBULATORY_CARE_PROVIDER_SITE_OTHER): Payer: Medicare Other

## 2024-06-14 VITALS — BP 124/74 | HR 82 | Ht 62.0 in | Wt 188.0 lb

## 2024-06-14 DIAGNOSIS — Z0000 Encounter for general adult medical examination without abnormal findings: Secondary | ICD-10-CM

## 2024-06-14 NOTE — Progress Notes (Signed)
 Subjective:   Allison Parker is a 83 y.o. who presents for a Medicare Wellness preventive visit.  As a reminder, Annual Wellness Visits don't include a physical exam, and some assessments may be limited, especially if this visit is performed virtually. We may recommend an in-person follow-up visit with your provider if needed.  Visit Complete: Virtual I connected with  Allison Parker on 06/14/24 by a audio enabled telemedicine application and verified that I am speaking with the correct person using two identifiers.  Patient Location: Home  Provider Location: Home Office  I discussed the limitations of evaluation and management by telemedicine. The patient expressed understanding and agreed to proceed.  Vital Signs: Because this visit was a virtual/telehealth visit, some criteria may be missing or patient reported. Any vitals not documented were not able to be obtained and vitals that have been documented are patient reported.  VideoDeclined- This patient declined Librarian, academic. Therefore the visit was completed with audio only.  Persons Participating in Visit: Patient.  AWV Questionnaire: No: Patient Medicare AWV questionnaire was not completed prior to this visit.  Cardiac Risk Factors include: advanced age (>75men, >31 women);dyslipidemia;hypertension;obesity (BMI >30kg/m2)     Objective:    Today's Vitals   06/14/24 0957 06/14/24 0959  BP: 124/74   Pulse: 82   Weight: 188 lb (85.3 kg)   Height: 5' 2 (1.575 m)   PainSc:  6    Body mass index is 34.39 kg/m.     06/14/2024   10:04 AM 06/14/2023   10:33 AM 06/10/2022   11:06 AM 01/04/2022    2:13 PM 12/22/2021   11:14 AM 04/16/2021    3:26 PM 05/11/2019    9:51 AM  Advanced Directives  Does Patient Have a Medical Advance Directive? No No No No No No No  Would patient like information on creating a medical advance directive?  Yes (MAU/Ambulatory/Procedural Areas - Information given) No -  Patient declined No - Patient declined  No - Patient declined No - Patient declined      Data saved with a previous flowsheet row definition    Current Medications (verified) Outpatient Encounter Medications as of 06/14/2024  Medication Sig   amLODipine  (NORVASC ) 5 MG tablet Take 1 tablet (5 mg total) by mouth daily.   aspirin  EC 81 MG tablet Take 1 tablet (81 mg total) by mouth daily.   atorvastatin  (LIPITOR) 20 MG tablet Take 1 tablet (20 mg total) by mouth daily.   b complex vitamins capsule Take 1 capsule by mouth daily.   Calcium  Citrate-Vitamin D  (CALCIUM  + D PO) Take 1 tablet by mouth daily.   citalopram  (CELEXA ) 40 MG tablet Take 1 tablet (40 mg total) by mouth daily.   doxylamine, Sleep, (UNISOM) 25 MG tablet Take 25 mg by mouth at bedtime.   gabapentin  (NEURONTIN ) 100 MG capsule Take 1 capsule (100 mg total) by mouth every morning AND 6 capsules (600 mg total) every evening.   HYDROcodone -acetaminophen  (NORCO) 10-325 MG tablet Take 1 tablet by mouth every 6 (six) hours as needed.   HYDROcodone -acetaminophen  (NORCO) 10-325 MG tablet Take 1 tablet by mouth every 6 (six) hours as needed.   [START ON 07/02/2024] HYDROcodone -acetaminophen  (NORCO) 10-325 MG tablet Take 1 tablet by mouth every 6 (six) hours as needed.   levothyroxine  (SYNTHROID ) 25 MCG tablet TAKE 1 TABLET BY MOUTH EVERY DAY   Melatonin 10 MG CAPS Take 10 mg by mouth at bedtime.   meloxicam  (MOBIC ) 7.5 MG  tablet Take 1 tablet (7.5 mg total) by mouth daily.   Multiple Vitamins-Minerals (PRESERVISION AREDS 2) CAPS Take 1 capsule by mouth 2 (two) times daily.   nystatin  cream (MYCOSTATIN ) Apply topically 3 (three) times daily.   nystatin  ointment (MYCOSTATIN ) Apply topically 2 (two) times daily.   trospium  (SANCTURA ) 20 MG tablet Take 1 tablet (20 mg total) by mouth 2 (two) times daily.   No facility-administered encounter medications on file as of 06/14/2024.    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   TOTAL KNEE ARTHROPLASTY Right 01/04/2022   Procedure: RIGHT TOTAL KNEE ARTHROPLASTY;  Surgeon: Liam Lerner, MD;  Location: [facility];  Service: Orthopedics;  Laterality: Right;   Family History  Problem Relation Age of Onset   Cancer Father        Lung   Arthritis Father    Dementia Mother    Arthritis Mother    Hypertension Brother    Other Paternal Grandmother        eye tumor   Cancer Paternal Grandfather        prostate   Arthritis Brother    Heart attack Brother    Social History   Socioeconomic History   Marital status: Married    Spouse name: Trudy   Number of children: 3   Years of education: Not on file   Highest education level: 12th grade  Occupational History   Occupation: retired    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 One child passed away. One son lives next door.   Social Drivers of Health   Financial Resource Strain: Medium Risk (06/14/2024)   Overall  Financial Resource Strain (CARDIA)    Difficulty of Paying Living Expenses: Somewhat hard  Food Insecurity: No Food Insecurity (06/14/2024)   Hunger Vital Sign    Worried About Running Out of Food in the Last Year: Never true    Ran Out of Food in the Last Year: Never true  Transportation Needs: No Transportation Needs (06/14/2024)   PRAPARE - Administrator, Civil Service (Medical): No    Lack of Transportation (Non-Medical): No  Physical Activity: Insufficiently Active (06/14/2024)   Exercise Vital Sign    Days  of Exercise per Week: 2 days    Minutes of Exercise per Session: 20 min  Stress: Stress Concern Present (06/14/2024)   [employer] of Occupational Health - Occupational Stress Questionnaire    Feeling of Stress: Rather much  Social Connections: Socially Integrated (06/14/2024)   Social Connection and Isolation Panel    Frequency of Communication with Friends and Family: More than three times a week    Frequency of Social Gatherings with Friends and Family: More than three times a week    Attends Religious Services: More than 4 times per year    Active Member of [employer] or Organizations: Yes    Attends Banker Meetings: 1 to 4 times per year    Marital Status: Married    Tobacco Counseling Counseling given: Yes    Clinical Intake:  Pre-visit preparation completed: Yes  Pain : 0-10 (lower back) Pain Score: 6  Pain Type: Chronic pain Pain Location: Back Pain Orientation: Lower Pain Descriptors / Indicators: Constant Pain Onset: Other (comment) Pain Frequency: Constant Pain Relieving Factors: Rx meds  Pain Relieving Factors: Rx meds  BMI - recorded: 34.39 Nutritional Status: BMI > 30  Obese Nutritional Risks: None Diabetes: No  No results found for: HGBA1C   How often do you need to have someone help you when you read instructions, pamphlets, or other written materials from your doctor or pharmacy?: 1 - Never  Interpreter Needed?:  No  Information entered by :: alia t/cma   Activities of Daily Living     06/14/2024   10:02 AM  In your present state of health, do you have any difficulty performing the following activities:  Hearing? 0  Vision? 1  Difficulty concentrating or making decisions? 0  Walking or climbing stairs? 0  Dressing or bathing? 0  Doing errands, shopping? 1  Comment pt's huSBAND  Quarry manager and eating ? N  Using the Toilet? N  In the past six months, have you accidently leaked urine? N  Do you have problems with loss of bowel control? N  Managing your Medications? N  Managing your Finances? N  Housekeeping or managing your Housekeeping? N    Patient Care Team: Lavell Bari LABOR, FNP as PCP - General (Nurse Practitioner) Alvan Ronal BRAVO, MD (Inactive) as PCP - Cardiology (Cardiology) Tobie Baptist, MD as Consulting Physician (Ophthalmology) Liam Lerner, MD as Consulting Physician (Orthopedic Surgery)  I have updated your Care Teams any recent Medical Services you may have received from other providers in the past year.     Assessment:   This is a routine wellness examination for Ariza.  Hearing/Vision screen Hearing Screening - Comments:: Pt denies hearing dif Vision Screening - Comments:: Pt wear glasses however vision is getting worst even w/glasses on due to macular degenerative dis. /pt goes to Dr. Tobie in [place], [place]/last 3weeks ago/ upcoming apt at the end of 05/2024   Goals Addressed   None    Depression Screen     06/14/2024   10:07 AM 05/04/2024   10:34 AM 02/02/2024   10:38 AM 11/04/2023   10:29 AM 06/14/2023   10:31 AM 05/02/2023   10:25 AM 01/25/2023   10:45 AM  PHQ 2/9 Scores  PHQ - 2 Score 4 2 2 2  0 0 0  PHQ- 9 Score 7 5 5 5  0 0 0    Fall Risk     06/14/2024    9:59 AM 05/04/2024   10:33 AM 11/04/2023   10:20 AM 06/14/2023  10:30 AM 05/02/2023   10:24 AM  Fall Risk   Falls in the past year? 0 1 0 0 0  Number falls in past yr: 0 0 0 0 0  Injury with  Fall? 0 0 0 0 0  Risk for fall due to : No Fall Risks Impaired vision;Impaired mobility History of fall(s);No Fall Risks No Fall Risks Impaired vision  Follow up Falls evaluation completed Falls evaluation completed Falls evaluation completed;Education provided Falls prevention discussed Falls evaluation completed;Education provided    MEDICARE RISK AT HOME:  Medicare Risk at Home Any stairs in or around the home?: Yes If so, are there any without handrails?: Yes Home free of loose throw rugs in walkways, pet beds, electrical cords, etc?: Yes Adequate lighting in your home to reduce risk of falls?: Yes Life alert?: No Use of a cane, walker or w/c?: No Grab bars in the bathroom?: Yes Shower chair or bench in shower?: Yes Elevated toilet seat or a handicapped toilet?: Yes  TIMED UP AND GO:  Was the test performed?  no  Cognitive Function: 6CIT completed        06/14/2023   10:33 AM 06/10/2022   11:09 AM 05/11/2019    9:55 AM  6CIT Screen  What Year? 0 points 0 points 0 points  What month? 0 points 0 points 0 points  What time? 0 points 0 points 0 points  Count back from 20 0 points 0 points 0 points  Months in reverse 0 points 0 points 0 points  Repeat phrase 0 points 0 points 0 points  Total Score 0 points 0 points 0 points    Immunizations Immunization History  Administered Date(s) Administered   Fluad Quad(high Dose 65+) 08/16/2019, 10/20/2020   Influenza, High Dose Seasonal PF 10/11/2017, 10/10/2018   Influenza,inj,Quad PF,6+ Mos 08/14/2013, 11/11/2014, 08/08/2015, 07/13/2016   Janssen (J&J) SARS-COV-2 Vaccination 02/21/2020   PFIZER(Purple Top)SARS-COV-2 Vaccination 11/26/2020   Pneumococcal Conjugate-13 07/13/2016   Pneumococcal Polysaccharide-23 08/07/2012   Tdap 09/25/2007, 06/11/2014    Screening Tests Health Maintenance  Topic Date Due   Zoster Vaccines- Shingrix (1 of 2) Never done   COVID-19 Vaccine (3 - 2024-25 season) 06/26/2023   DTaP/Tdap/Td (3 - Td  or Tdap) 06/11/2024   INFLUENZA VACCINE  05/25/2024   Medicare Annual Wellness (AWV)  06/14/2025   DEXA SCAN  08/03/2025   Pneumococcal Vaccine: 50+ Years  Completed   HPV VACCINES  Aged Out   Meningococcal B Vaccine  Aged Out   Colonoscopy  Discontinued   Hepatitis C Screening  Discontinued    Health Maintenance  Health Maintenance Due  Topic Date Due   Zoster Vaccines- Shingrix (1 of 2) Never done   COVID-19 Vaccine (3 - 2024-25 season) 06/26/2023   DTaP/Tdap/Td (3 - Td or Tdap) 06/11/2024   INFLUENZA VACCINE  05/25/2024   Health Maintenance Items Addressed: See Nurse Notes at the end of this note  Additional Screening:  Vision Screening: Recommended annual ophthalmology exams for early detection of glaucoma and other disorders of the eye. Would you like a referral to an eye doctor? No    Dental Screening: Recommended annual dental exams for proper oral hygiene  Community Resource Referral / Chronic Care Management: CRR required this visit?  No   CCM required this visit?  No   Plan:    I have personally reviewed and noted the following in the patient's chart:   Medical and social history Use of alcohol, tobacco or illicit drugs  Current  medications and supplements including opioid prescriptions. Patient is not currently taking opioid prescriptions. Functional ability and status Nutritional status Physical activity Advanced directives List of other physicians Hospitalizations, surgeries, and ER visits in previous 12 months Vitals Screenings to include cognitive, depression, and falls Referrals and appointments  In addition, I have reviewed and discussed with patient certain preventive protocols, quality metrics, and best practice recommendations. A written personalized care plan for preventive services as well as general preventive health recommendations were provided to patient.   Ozie Ned, CMA   06/14/2024   After Visit Summary: (MyChart) Due to this  being a telephonic visit, the after visit summary with patients personalized plan was offered to patient via MyChart   Notes: PCP Follow Up Recommendations: Pt is aware and due the following DTAP/Singles/Flu vaccines

## 2024-06-14 NOTE — Patient Instructions (Signed)
 Ms. Bentz , Thank you for taking time out of your busy schedule to complete your Annual Wellness Visit with me. I enjoyed our conversation and look forward to speaking with you again next year. I, as well as your care team,  appreciate your ongoing commitment to your health goals. Please review the following plan we discussed and let me know if I can assist you in the future. Your Game plan/ To Do List    Referrals: If you haven't heard from the office you've been referred to, please reach out to them at the phone provided.   Follow up Visits: We will see or speak with you next year for your Next Medicare AWV with our clinical staff on 06/17/25 at 9:20a.m Have you seen your provider in the last 6 months (3 months if uncontrolled diabetes)? Yes  Clinician Recommendations:  Aim for 30 minutes of exercise or brisk walking, 6-8 glasses of water , and 5 servings of fruits and vegetables each day.       This is a list of the screenings recommended for you:  Health Maintenance  Topic Date Due   Zoster (Shingles) Vaccine (1 of 2) Never done   COVID-19 Vaccine (3 - 2024-25 season) 06/26/2023   DTaP/Tdap/Td vaccine (3 - Td or Tdap) 06/11/2024   Medicare Annual Wellness Visit  06/13/2024   Flu Shot  05/25/2024   DEXA scan (bone density measurement)  08/03/2025   Pneumococcal Vaccine for age over 40  Completed   HPV Vaccine  Aged Out   Meningitis B Vaccine  Aged Out   Colon Cancer Screening  Discontinued   Hepatitis C Screening  Discontinued    Advanced directives: (Declined) Advance directive discussed with you today. Even though you declined this today, please call our office should you change your mind, and we can give you the proper paperwork for you to fill out. Advance Care Planning is important because it:  [x]  Makes sure you receive the medical care that is consistent with your values, goals, and preferences  [x]  It provides guidance to your family and loved ones and reduces their  decisional burden about whether or not they are making the right decisions based on your wishes.  Follow the link provided in your after visit summary or read over the paperwork we have mailed to you to help you started getting your Advance Directives in place. If you need assistance in completing these, please reach out to us  so that we can help you!  See attachments for Preventive Care and Fall Prevention Tips.

## 2024-07-04 ENCOUNTER — Other Ambulatory Visit: Payer: Self-pay | Admitting: Family

## 2024-07-04 DIAGNOSIS — I10 Essential (primary) hypertension: Secondary | ICD-10-CM

## 2024-07-04 DIAGNOSIS — G890 Central pain syndrome: Secondary | ICD-10-CM

## 2024-07-04 DIAGNOSIS — G8929 Other chronic pain: Secondary | ICD-10-CM

## 2024-07-30 ENCOUNTER — Other Ambulatory Visit: Payer: Self-pay | Admitting: Family

## 2024-07-30 DIAGNOSIS — G8929 Other chronic pain: Secondary | ICD-10-CM

## 2024-07-30 DIAGNOSIS — G890 Central pain syndrome: Secondary | ICD-10-CM

## 2024-08-07 ENCOUNTER — Ambulatory Visit: Admitting: Family

## 2024-08-07 ENCOUNTER — Encounter: Payer: Self-pay | Admitting: Family

## 2024-08-07 VITALS — BP 112/70 | HR 82 | Temp 97.2°F | Ht 62.0 in | Wt 189.8 lb

## 2024-08-07 DIAGNOSIS — E782 Mixed hyperlipidemia: Secondary | ICD-10-CM

## 2024-08-07 DIAGNOSIS — I6930 Unspecified sequelae of cerebral infarction: Secondary | ICD-10-CM

## 2024-08-07 DIAGNOSIS — F331 Major depressive disorder, recurrent, moderate: Secondary | ICD-10-CM | POA: Diagnosis not present

## 2024-08-07 DIAGNOSIS — Z0289 Encounter for other administrative examinations: Secondary | ICD-10-CM

## 2024-08-07 DIAGNOSIS — I10 Essential (primary) hypertension: Secondary | ICD-10-CM | POA: Diagnosis not present

## 2024-08-07 DIAGNOSIS — E039 Hypothyroidism, unspecified: Secondary | ICD-10-CM

## 2024-08-07 DIAGNOSIS — F1120 Opioid dependence, uncomplicated: Secondary | ICD-10-CM | POA: Diagnosis not present

## 2024-08-07 DIAGNOSIS — F411 Generalized anxiety disorder: Secondary | ICD-10-CM

## 2024-08-07 DIAGNOSIS — M5442 Lumbago with sciatica, left side: Secondary | ICD-10-CM

## 2024-08-07 DIAGNOSIS — G4700 Insomnia, unspecified: Secondary | ICD-10-CM

## 2024-08-07 DIAGNOSIS — R1031 Right lower quadrant pain: Secondary | ICD-10-CM

## 2024-08-07 DIAGNOSIS — G8929 Other chronic pain: Secondary | ICD-10-CM

## 2024-08-07 DIAGNOSIS — G890 Central pain syndrome: Secondary | ICD-10-CM

## 2024-08-07 DIAGNOSIS — E669 Obesity, unspecified: Secondary | ICD-10-CM

## 2024-08-07 DIAGNOSIS — M1711 Unilateral primary osteoarthritis, right knee: Secondary | ICD-10-CM

## 2024-08-07 MED ORDER — CITALOPRAM HYDROBROMIDE 40 MG PO TABS: 40.0000 mg | ORAL_TABLET | Freq: Every day | ORAL | 0 refills | Status: DC

## 2024-08-07 MED ORDER — MELOXICAM 7.5 MG PO TABS: 7.5000 mg | ORAL_TABLET | Freq: Every day | ORAL | 0 refills | Status: DC

## 2024-08-07 MED ORDER — HYDROCODONE-ACETAMINOPHEN 10-325 MG PO TABS: 1.0000 | ORAL_TABLET | Freq: Four times a day (QID) | ORAL | 0 refills | Status: DC | PRN

## 2024-08-07 NOTE — Patient Instructions (Signed)
 Abdominal Pain, Adult  Pain in the abdomen (abdominal pain) can be caused by many things. In most cases, it gets better with no treatment or by being treated at home. But in some cases, it can be serious. Your health care provider will ask questions about your medical history and do a physical exam to try to figure out what is causing your pain. Follow these instructions at home: Medicines Take over-the-counter and prescription medicines only as told by your provider. Do not take medicines that help you poop (laxatives) unless told by your provider. General instructions Watch your condition for any changes. Drink enough fluid to keep your pee (urine) pale yellow. Contact a health care provider if: Your pain changes, gets worse, or lasts longer than expected. You have severe cramping or bloating in your abdomen, or you vomit. Your pain gets worse with meals, after eating, or with certain foods. You are constipated or have diarrhea for more than 2-3 days. You are not hungry, or you lose weight without trying. You have signs of dehydration. These may include: Dark pee, very little pee, or no pee. Cracked lips or dry mouth. Sleepiness or weakness. You have pain when you pee (urinate) or poop. Your abdominal pain wakes you up at night. You have blood in your pee. You have a fever. Get help right away if: You cannot stop vomiting. Your pain is only in one part of the abdomen. Pain on the right side could be caused by appendicitis. You have bloody or black poop (stool), or poop that looks like tar. You have trouble breathing. You have chest pain. These symptoms may be an emergency. Get help right away. Call 911. Do not wait to see if the symptoms will go away. Do not drive yourself to the hospital. This information is not intended to replace advice given to you by your health care provider. Make sure you discuss any questions you have with your health care provider. Document Revised:  07/28/2022 Document Reviewed: 07/28/2022 Elsevier Patient Education  2024 [employer].

## 2024-08-07 NOTE — Progress Notes (Signed)
 Subjective:    Patient ID: Allison Parker, female    DOB: 21-Aug-1941, 83 y.o.   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 PT states this is stable at this time.  Pt is followed by Ortho as needed.   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 Her pain has greatly improved.    Reports she is having urinary frequency and urgency. This was improving when taking vesicare , but insurance would not cover. She has been taking oxybutynin  15 mg with relief, but having dry mouth. She took detrol  4 mg that didn't help. Has been taking trospium  20 mg BID with mild relief.   Hypertension This is a chronic problem. The current episode started more than 1 year ago. The problem has been resolved since onset. The problem is controlled. Associated symptoms include anxiety, malaise/fatigue and peripheral edema. Pertinent negatives include no shortness of breath. Risk factors for coronary artery disease include dyslipidemia and sedentary lifestyle. The current treatment provides moderate improvement. Hypertensive end-organ damage includes CVA. Identifiable causes of hypertension include a thyroid  problem.  Thyroid  Problem Presents for follow-up visit. Symptoms include anxiety, dry skin and fatigue. Patient reports no constipation, depressed mood or diarrhea. The symptoms have been stable.  Arthritis Presents for follow-up visit. She complains of pain and stiffness. She reports no joint swelling. Affected locations include the left knee, right knee, left MCP, right MCP, right hip, left hip, right shoulder and left shoulder. Her pain is at a severity of 7/10. Associated symptoms include fatigue. Pertinent negatives include no diarrhea or fever.   Urinary Frequency  This is a chronic problem. The current episode started more than 1 year ago. The patient is experiencing no pain. Associated symptoms include frequency. Pertinent negatives include no hematuria or vomiting. Treatments tried: sanctura . The treatment provided mild relief.  Insomnia Primary symptoms: sleep disturbance, difficulty falling asleep, frequent awakening, malaise/fatigue.   The current episode started more than one year. Past treatments include medication. The treatment provided moderate relief. PMH includes: depression.   Hyperlipidemia This is a chronic problem. The current episode started more than 1 year ago. The problem is controlled. Recent lipid tests were reviewed and are normal. Exacerbating diseases include obesity. Pertinent negatives include no shortness of breath. Current antihyperlipidemic treatment includes statins. The current treatment provides moderate improvement of lipids. Risk factors for coronary artery disease include dyslipidemia, hypertension, a sedentary lifestyle and obesity.  Anxiety Presents for follow-up visit. Symptoms include excessive worry, insomnia, nervous/anxious behavior and restlessness. Patient reports no depressed mood or shortness of breath. Symptoms occur occasionally. The severity of symptoms is moderate.    Depression        This is a chronic problem.  The current episode started more than 1 year ago.   The problem occurs intermittently.  Associated symptoms include fatigue, helplessness, hopelessness, insomnia, restlessness and sad.  Past treatments include SSRIs - Selective serotonin reuptake inhibitors.  Past medical history includes thyroid  problem and anxiety.   Back Pain This is a chronic problem. The current episode started more than 1 year ago. The problem occurs intermittently. The problem has been waxing and waning since onset. The pain is present in the lumbar spine. The quality  of the pain is described as aching. The  pain is at a severity of 9/10. The pain is moderate. Associated symptoms include abdominal pain. Pertinent negatives include no fever. Risk factors include obesity. She has tried bed rest and analgesics for the symptoms. The treatment provided moderate relief.  Abdominal Pain This is a new problem. The current episode started more than 1 month ago. The problem occurs intermittently. The pain is located in the RLQ. The pain is at a severity of 7/10. The pain is moderate. The quality of the pain is aching. Associated symptoms include frequency. Pertinent negatives include no constipation, diarrhea, fever, flatus, hematuria or vomiting. Nothing aggravates the pain. She has tried oral narcotic analgesics for the symptoms. The treatment provided mild relief.   Current opioids rx- Norco 10-325 mg  # meds rx- 90 Effectiveness of current meds-reports constant pain Adverse reactions from pain meds-n/a Morphine equivalent- 30   Pill count performed-No Last drug screen - 05/04/24 ( high risk q66m, moderate risk q68m, low risk yearly ) Urine drug screen today- Yes Was the [facility] reviewed- Yes             If yes were their any concerning findings? - none   Pain contract signed on: 05/04/24    Review of Systems  Constitutional:  Positive for fatigue and malaise/fatigue. Negative for fever.  Respiratory:  Negative for shortness of breath.   Gastrointestinal:  Positive for abdominal pain. Negative for constipation, diarrhea, flatus and vomiting.  Genitourinary:  Positive for frequency. Negative for hematuria.  Musculoskeletal:  Positive for back pain and stiffness. Negative for joint swelling.  Psychiatric/Behavioral:  Positive for depression and sleep disturbance. The patient is nervous/anxious and has insomnia.   All other systems reviewed and are negative.  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 One child passed away. One son lives next door.   Social Drivers of Health   Financial Resource Strain: Medium Risk (06/14/2024)   Overall Financial Resource Strain (CARDIA)    Difficulty of Paying Living Expenses: Somewhat hard  Food Insecurity: No Food Insecurity (06/14/2024)   Hunger Vital Sign    Worried About Running Out of Food in the Last Year: Never true    Ran  Out of Food in the Last Year: Never true  Transportation Needs: No Transportation Needs (06/14/2024)   PRAPARE - Administrator, Civil Service (Medical): No    Lack of Transportation (Non-Medical): No  Physical Activity: Insufficiently Active (06/14/2024)   Exercise Vital Sign    Days of Exercise per Week: 2 days    Minutes of Exercise per Session: 20 min  Stress: Stress Concern Present (06/14/2024)   [employer] of Occupational Health - Occupational Stress Questionnaire    Feeling of Stress: Rather much  Social Connections: Socially Integrated (06/14/2024)   Social Connection and Isolation Panel    Frequency of Communication with Friends and Family:  More than three times a week    Frequency of Social Gatherings with Friends and Family: More than three times a week    Attends Religious Services: More than 4 times per year    Active Member of [employer] or Organizations: Yes    Attends Banker Meetings: 1 to 4 times per year    Marital Status: Married       Objective:   Physical Exam Vitals reviewed.  Constitutional:      General: She is not in acute distress.    Appearance: She is well-developed.  HENT:     Head: Normocephalic and atraumatic.     Right Ear: Tympanic membrane normal.     Left Ear: Tympanic membrane normal.  Eyes:     Pupils: Pupils are equal, round, and reactive to light.  Neck:     Thyroid : No thyromegaly.  Cardiovascular:     Rate and Rhythm: Normal rate and regular rhythm.     Heart sounds: Normal heart sounds. No murmur heard. Pulmonary:     Effort: Pulmonary effort is normal. No respiratory distress.     Breath sounds: Normal breath sounds. No wheezing.  Abdominal:     General: Bowel sounds are normal. There is no distension.     Palpations: Abdomen is soft.     Tenderness: There is abdominal tenderness (mild RLQ).  Musculoskeletal:        General: Tenderness present. No deformity.     Cervical back: Normal range of motion and neck supple.     Right lower leg: Edema (trace) present.     Left lower leg: No edema.     Comments: Right sided weakness, pain in lumbar with flexion and extension   Skin:    General: Skin is warm and dry.  Neurological:     Mental Status: She is alert and oriented to person, place, and time.     Cranial Nerves: No cranial nerve deficit.     Motor: Weakness (right sided) present.     Gait: Gait abnormal.     Deep Tendon Reflexes: Reflexes are normal and symmetric.  Psychiatric:        Behavior: Behavior normal.        Thought Content: Thought content normal.        Judgment: Judgment normal.      BP 112/70   Pulse 82   Temp (!) 97.2 F (36.2 C)  (Temporal)   Ht 5' 2 (1.575 m)   Wt 189 lb 12.8 oz (86.1 kg)   SpO2 95%   BMI 34.71 kg/m      Assessment & Plan:  Rachyl Wuebker Eplin comes in today with chief complaint of Medical Management of Chronic Issues   Diagnosis and orders addressed:  1. GAD (generalized anxiety disorder) - citalopram  (CELEXA )  40 MG tablet; Take 1 tablet (40 mg total) by mouth daily.  Dispense: 90 tablet; Refill: 0 - CMP14+EGFR - CBC with Differential/Platelet  2. Moderate episode of recurrent major depressive disorder (HCC) - citalopram  (CELEXA ) 40 MG tablet; Take 1 tablet (40 mg total) by mouth daily.  Dispense: 90 tablet; Refill: 0 - CMP14+EGFR - CBC with Differential/Platelet  3. Chronic bilateral low back pain with left-sided sciatica - HYDROcodone -acetaminophen  (NORCO) 10-325 MG tablet; Take 1 tablet by mouth every 6 (six) hours as needed.  Dispense: 120 tablet; Refill: 0 - HYDROcodone -acetaminophen  (NORCO) 10-325 MG tablet; Take 1 tablet by mouth every 6 (six) hours as needed.  Dispense: 120 tablet; Refill: 0 - HYDROcodone -acetaminophen  (NORCO) 10-325 MG tablet; Take 1 tablet by mouth every 6 (six) hours as needed.  Dispense: 120 tablet; Refill: 0 - meloxicam  (MOBIC ) 7.5 MG tablet; Take 1 tablet (7.5 mg total) by mouth daily.  Dispense: 90 tablet; Refill: 0 - CMP14+EGFR - CBC with Differential/Platelet  4. Uncomplicated opioid dependence (HCC) - HYDROcodone -acetaminophen  (NORCO) 10-325 MG tablet; Take 1 tablet by mouth every 6 (six) hours as needed.  Dispense: 120 tablet; Refill: 0 - HYDROcodone -acetaminophen  (NORCO) 10-325 MG tablet; Take 1 tablet by mouth every 6 (six) hours as needed.  Dispense: 120 tablet; Refill: 0 - HYDROcodone -acetaminophen  (NORCO) 10-325 MG tablet; Take 1 tablet by mouth every 6 (six) hours as needed.  Dispense: 120 tablet; Refill: 0 - CMP14+EGFR - CBC with Differential/Platelet  5. Pain medication agreement signed - HYDROcodone -acetaminophen  (NORCO) 10-325 MG tablet; Take  1 tablet by mouth every 6 (six) hours as needed.  Dispense: 120 tablet; Refill: 0 - HYDROcodone -acetaminophen  (NORCO) 10-325 MG tablet; Take 1 tablet by mouth every 6 (six) hours as needed.  Dispense: 120 tablet; Refill: 0 - HYDROcodone -acetaminophen  (NORCO) 10-325 MG tablet; Take 1 tablet by mouth every 6 (six) hours as needed.  Dispense: 120 tablet; Refill: 0 - CMP14+EGFR - CBC with Differential/Platelet  6. Osteoarthritis of right knee, unspecified osteoarthritis type  - HYDROcodone -acetaminophen  (NORCO) 10-325 MG tablet; Take 1 tablet by mouth every 6 (six) hours as needed.  Dispense: 120 tablet; Refill: 0 - HYDROcodone -acetaminophen  (NORCO) 10-325 MG tablet; Take 1 tablet by mouth every 6 (six) hours as needed.  Dispense: 120 tablet; Refill: 0 - HYDROcodone -acetaminophen  (NORCO) 10-325 MG tablet; Take 1 tablet by mouth every 6 (six) hours as needed.  Dispense: 120 tablet; Refill: 0 - CMP14+EGFR - CBC with Differential/Platelet  7. Thalamic pain syndrome - meloxicam  (MOBIC ) 7.5 MG tablet; Take 1 tablet (7.5 mg total) by mouth daily.  Dispense: 90 tablet; Refill: 0 - CMP14+EGFR - CBC with Differential/Platelet  8. Essential hypertension, benign (Primary)  - CMP14+EGFR - CBC with Differential/Platelet  9. Mixed hyperlipidemia - CMP14+EGFR - CBC with Differential/Platelet  10. Hypothyroidism, unspecified type - CMP14+EGFR - CBC with Differential/Platelet  11. Insomnia, unspecified type  - CMP14+EGFR - CBC with Differential/Platelet  12. Late effect of cerebrovascular accident - CMP14+EGFR - CBC with Differential/Platelet  13. Obesity (BMI 30-39.9) - CMP14+EGFR - CBC with Differential/Platelet  14. Abdominal pain, RLQ - CT ABDOMEN PELVIS W CONTRAST; Future - CMP14+EGFR - CBC with Differential/Platelet   Labs reviewed  Continue current medications Patient reviewed in [place] controlled database, no flags noted. Contract and drug screen are up to date.  Will increase  Norco to #120 from #90.  Given RLQ pain will order CT scan to rule out appendicitis   Discussed red flags to go to ED if RLQ pain worsens.  Health Maintenance reviewed Diet and  exercise encouraged Approx 40 mins spent with patient, chart review, and education    Follow up plan: 3 months    Bari Learn, FNP

## 2024-08-08 LAB — CBC WITH DIFFERENTIAL/PLATELET
Basophils Absolute: 0 x10E3/uL (ref 0.0–0.2)
Basos: 0 %
EOS (ABSOLUTE): 0 x10E3/uL (ref 0.0–0.4)
Eos: 0 %
Hematocrit: 39.3 % (ref 34.0–46.6)
Hemoglobin: 12.9 g/dL (ref 11.1–15.9)
Immature Grans (Abs): 0 x10E3/uL (ref 0.0–0.1)
Immature Granulocytes: 0 %
Lymphocytes Absolute: 0.8 x10E3/uL (ref 0.7–3.1)
Lymphs: 10 %
MCH: 31.3 pg (ref 26.6–33.0)
MCHC: 32.8 g/dL (ref 31.5–35.7)
MCV: 95 fL (ref 79–97)
Monocytes Absolute: 0.6 x10E3/uL (ref 0.1–0.9)
Monocytes: 7 %
Neutrophils Absolute: 6.9 x10E3/uL (ref 1.4–7.0)
Neutrophils: 83 %
Platelets: 184 x10E3/uL (ref 150–450)
RBC: 4.12 x10E6/uL (ref 3.77–5.28)
RDW: 12.1 % (ref 11.7–15.4)
WBC: 8.5 x10E3/uL (ref 3.4–10.8)

## 2024-08-08 LAB — CMP14+EGFR
ALT: 29 IU/L (ref 0–32)
AST: 18 IU/L (ref 0–40)
Albumin: 3.9 g/dL (ref 3.8–4.8)
Alkaline Phosphatase: 128 IU/L (ref 49–135)
BUN/Creatinine Ratio: 22 (ref 12–28)
BUN: 18 mg/dL (ref 8–27)
Bilirubin Total: 0.3 mg/dL (ref 0.0–1.2)
CO2: 25 mmol/L (ref 20–29)
Calcium: 9.1 mg/dL (ref 8.7–10.3)
Chloride: 98 mmol/L (ref 96–106)
Creatinine, Ser: 0.81 mg/dL (ref 0.57–1.00)
Globulin, Total: 2.4 g/dL (ref 1.5–4.5)
Glucose: 99 mg/dL (ref 70–99)
Potassium: 4 mmol/L (ref 3.5–5.2)
Sodium: 136 mmol/L (ref 134–144)
Total Protein: 6.3 g/dL (ref 6.0–8.5)
eGFR: 73 mL/min/1.73 (ref >59)

## 2024-08-09 ENCOUNTER — Ambulatory Visit: Payer: Self-pay | Admitting: Family

## 2024-08-25 ENCOUNTER — Other Ambulatory Visit: Payer: Self-pay | Admitting: Family

## 2024-08-25 DIAGNOSIS — G890 Central pain syndrome: Secondary | ICD-10-CM

## 2024-08-25 DIAGNOSIS — G8929 Other chronic pain: Secondary | ICD-10-CM

## 2024-09-18 ENCOUNTER — Ambulatory Visit (HOSPITAL_COMMUNITY)
Admission: RE | Admit: 2024-09-18 | Discharge: 2024-09-18 | Disposition: A | Discharge status: Home / Self Care | Source: Ambulatory Visit | Attending: Family | Admitting: Family

## 2024-09-18 DIAGNOSIS — R1031 Right lower quadrant pain: Secondary | ICD-10-CM | POA: Diagnosis present

## 2024-09-18 MED ORDER — IOHEXOL 300 MG/ML  SOLN
100.0000 mL | Freq: Once | INTRAMUSCULAR | Status: AC | PRN
  Administered 2024-09-18: 100 mL via INTRAVENOUS

## 2024-10-24 ENCOUNTER — Other Ambulatory Visit: Payer: Self-pay | Admitting: Family

## 2024-10-24 DIAGNOSIS — N3281 Overactive bladder: Secondary | ICD-10-CM

## 2024-11-01 ENCOUNTER — Other Ambulatory Visit: Payer: Self-pay | Admitting: Family

## 2024-11-01 DIAGNOSIS — E039 Hypothyroidism, unspecified: Secondary | ICD-10-CM

## 2024-11-01 DIAGNOSIS — I10 Essential (primary) hypertension: Secondary | ICD-10-CM

## 2024-11-08 ENCOUNTER — Encounter: Payer: Self-pay | Admitting: Family

## 2024-11-08 ENCOUNTER — Ambulatory Visit: Payer: Self-pay | Admitting: Family

## 2024-11-08 VITALS — BP 131/72 | HR 79 | Temp 79.0°F | Ht 62.0 in | Wt 190.6 lb

## 2024-11-08 DIAGNOSIS — E669 Obesity, unspecified: Secondary | ICD-10-CM

## 2024-11-08 DIAGNOSIS — I10 Essential (primary) hypertension: Secondary | ICD-10-CM | POA: Diagnosis not present

## 2024-11-08 DIAGNOSIS — G4700 Insomnia, unspecified: Secondary | ICD-10-CM

## 2024-11-08 DIAGNOSIS — Z0289 Encounter for other administrative examinations: Secondary | ICD-10-CM

## 2024-11-08 DIAGNOSIS — G8929 Other chronic pain: Secondary | ICD-10-CM | POA: Diagnosis not present

## 2024-11-08 DIAGNOSIS — N3281 Overactive bladder: Secondary | ICD-10-CM | POA: Diagnosis not present

## 2024-11-08 DIAGNOSIS — B372 Candidiasis of skin and nail: Secondary | ICD-10-CM | POA: Diagnosis not present

## 2024-11-08 DIAGNOSIS — M1711 Unilateral primary osteoarthritis, right knee: Secondary | ICD-10-CM

## 2024-11-08 DIAGNOSIS — F331 Major depressive disorder, recurrent, moderate: Secondary | ICD-10-CM

## 2024-11-08 DIAGNOSIS — F1120 Opioid dependence, uncomplicated: Secondary | ICD-10-CM | POA: Diagnosis not present

## 2024-11-08 DIAGNOSIS — G890 Central pain syndrome: Secondary | ICD-10-CM

## 2024-11-08 DIAGNOSIS — F411 Generalized anxiety disorder: Secondary | ICD-10-CM

## 2024-11-08 DIAGNOSIS — E782 Mixed hyperlipidemia: Secondary | ICD-10-CM | POA: Diagnosis not present

## 2024-11-08 DIAGNOSIS — I6930 Unspecified sequelae of cerebral infarction: Secondary | ICD-10-CM

## 2024-11-08 DIAGNOSIS — E039 Hypothyroidism, unspecified: Secondary | ICD-10-CM | POA: Diagnosis not present

## 2024-11-08 MED ORDER — NYSTATIN 100000 UNIT/GM EX OINT: TOPICAL_OINTMENT | Freq: Two times a day (BID) | CUTANEOUS | 2 refills | Status: AC

## 2024-11-08 MED ORDER — CITALOPRAM HYDROBROMIDE 40 MG PO TABS: 40.0000 mg | ORAL_TABLET | Freq: Every day | ORAL | 0 refills | Status: AC

## 2024-11-08 MED ORDER — SOLIFENACIN SUCCINATE 10 MG PO TABS: 10.0000 mg | ORAL_TABLET | Freq: Every day | ORAL | 2 refills | Status: AC

## 2024-11-08 MED ORDER — MELOXICAM 7.5 MG PO TABS: 7.5000 mg | ORAL_TABLET | Freq: Every day | ORAL | 0 refills | Status: AC

## 2024-11-08 MED ORDER — HYDROCODONE-ACETAMINOPHEN 10-325 MG PO TABS: 1.0000 | ORAL_TABLET | Freq: Four times a day (QID) | ORAL | 0 refills | Status: AC | PRN

## 2024-11-08 MED ORDER — AMLODIPINE BESYLATE 5 MG PO TABS: 5.0000 mg | ORAL_TABLET | Freq: Every day | ORAL | 0 refills | Status: AC

## 2024-11-08 MED ORDER — LEVOTHYROXINE SODIUM 25 MCG PO TABS: 25.0000 ug | ORAL_TABLET | Freq: Every day | ORAL | 0 refills | Status: AC

## 2024-11-08 MED ORDER — NYSTATIN 100000 UNIT/GM EX CREA: TOPICAL_CREAM | Freq: Three times a day (TID) | CUTANEOUS | 2 refills | Status: AC

## 2024-11-08 MED ORDER — ATORVASTATIN CALCIUM 20 MG PO TABS: 20.0000 mg | ORAL_TABLET | Freq: Every day | ORAL | 3 refills | Status: AC

## 2024-11-08 NOTE — Progress Notes (Signed)
 "  Subjective:    Patient ID: Allison Parker, female    DOB: [birth year], 84 y.o.   MRN: [id number]  Chief Complaint  Patient presents with   Medical Management of Chronic Issues    Discuss meds for over active bladder. Needs upped.   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 PT states this is stable at this time.  Pt is followed by Ortho as needed.   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 Her pain has greatly improved.    Reports she is having urinary frequency and urgency. This was improving when taking vesicare , but insurance would not cover. She has been taking oxybutynin  15 mg with relief, but having dry mouth. She took detrol  4 mg that didn't help. Has been taking trospium  20 mg BID with mild relief.   Hypertension This is a chronic problem. The current episode started more than 1 year ago. The problem has been resolved since onset. The problem is controlled. Associated symptoms include anxiety, malaise/fatigue and peripheral edema. Pertinent negatives include no shortness of breath. Risk factors for coronary artery disease include dyslipidemia, sedentary lifestyle, post-menopausal state and obesity. The current treatment provides moderate improvement. Hypertensive end-organ damage includes CVA. Identifiable causes of hypertension include a thyroid  problem.  Thyroid  Problem Presents for follow-up visit. Symptoms include anxiety, dry skin and fatigue. Patient reports no depressed mood. The symptoms have been stable.  Arthritis Presents for follow-up visit. She complains of pain and stiffness. She reports no joint swelling. Affected locations include the left knee, right knee, left MCP, right MCP, right hip, left hip, right shoulder and left shoulder. Her pain is at a severity of 8/10. Associated symptoms include  fatigue.  Urinary Frequency  This is a chronic problem. The current episode started more than 1 year ago. The patient is experiencing no pain. Associated symptoms include frequency. Treatments tried: sanctura . The treatment provided mild relief.  Insomnia Primary symptoms: sleep disturbance, difficulty falling asleep, frequent awakening, malaise/fatigue.   The current episode started more than one year. The problem occurs intermittently. Past treatments include medication. The treatment provided mild relief. PMH includes: depression.   Hyperlipidemia This is a chronic problem. The current episode started more than 1 year ago. The problem is controlled. Recent lipid tests were reviewed and are normal. Exacerbating diseases include obesity. Pertinent negatives include no shortness of breath. Current antihyperlipidemic treatment includes statins. The current treatment provides moderate improvement of lipids. Risk factors for coronary artery disease include dyslipidemia, hypertension, a sedentary lifestyle and obesity.  Anxiety Presents for follow-up visit. Symptoms include excessive worry, insomnia, nervous/anxious behavior and restlessness. Patient reports no depressed mood or shortness of breath. Symptoms occur occasionally. The severity of symptoms is moderate.    Depression        This is a chronic problem.  The current episode started more than 1 year ago.   The problem occurs intermittently.  Associated symptoms include fatigue, insomnia, restlessness and sad.  Associated symptoms include no helplessness and no hopelessness.  Past treatments include SSRIs - Selective serotonin reuptake inhibitors.  Past medical history includes thyroid  problem and anxiety.   Back Pain This is a chronic problem. The current episode started more than 1 year ago. The problem occurs intermittently. The problem has been waxing and waning since onset. The  pain is present in the lumbar spine. The quality of the pain is  described as aching. The pain is at a severity of 7/10. The pain is moderate. Risk factors include obesity. She has tried bed rest and analgesics for the symptoms. The treatment provided moderate relief.   Current opioids rx- Norco 10-325 mg  # meds rx- 90 Effectiveness of current meds-reports constant pain Adverse reactions from pain meds-n/a Morphine equivalent- 30   Pill count performed-No Last drug screen - 05/04/24 ( high risk q62m, moderate risk q62m, low risk yearly ) Urine drug screen today- Yes Was the [facility] reviewed- Yes             If yes were their any concerning findings? - none   Pain contract signed on: 05/04/24    Review of Systems  Constitutional:  Positive for fatigue and malaise/fatigue.  Respiratory:  Negative for shortness of breath.   Genitourinary:  Positive for frequency.  Musculoskeletal:  Positive for back pain and stiffness. Negative for joint swelling.  Psychiatric/Behavioral:  Positive for depression and sleep disturbance. The patient is nervous/anxious and has insomnia.   All other systems reviewed and are negative.  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 One child passed away. One son lives next door.   Social Drivers of Health   Tobacco Use: Low Risk (11/08/2024)   Patient History    Smoking Tobacco Use: Never    Smokeless Tobacco Use: Never    Passive Exposure: Not on file  Financial Resource Strain: Medium Risk (06/14/2024)   Overall Financial Resource Strain (CARDIA)    Difficulty of Paying Living Expenses: Somewhat hard  Food Insecurity: No Food Insecurity (06/14/2024)   Epic    Worried About Radiation Protection Practitioner of Food in the Last Year: Never true    Ran Out of Food in the Last Year: Never true  Transportation Needs: No Transportation Needs (06/14/2024)   Epic    Lack of Transportation (Medical): No    Lack of Transportation (Non-Medical): No  Physical Activity: Insufficiently Active (06/14/2024)   Exercise Vital Sign    Days of Exercise per Week: 2 days    Minutes of Exercise per Session: 20 min  Stress: Stress Concern Present (  06/14/2024)   [employer] of Occupational Health - Occupational Stress Questionnaire    Feeling of Stress: Rather much  Social Connections: Socially Integrated (06/14/2024)   Social Connection and Isolation Panel    Frequency of Communication with Friends and Family: More than three times a week    Frequency of Social Gatherings with Friends and Family: More than three times a week    Attends Religious Services: More than 4 times per year    Active Member of Clubs or Organizations: Yes    Attends Banker Meetings: 1 to 4 times per year    Marital Status: Married  Depression (PHQ2-9): Low Risk (11/08/2024)   Depression (PHQ2-9)    PHQ-2 Score: 0  Alcohol Screen: Low Risk (06/14/2024)   Alcohol Screen    Last Alcohol Screening Score (AUDIT): 0  Housing: Unknown (06/14/2024)   Epic    Unable to Pay for Housing in the Last Year: No     Number of Times Moved in the Last Year: Not on file    Homeless in the Last Year: No  Utilities: Not At Risk (06/14/2024)   Epic    Threatened with loss of utilities: No  Health Literacy: Inadequate Health Literacy (06/14/2024)   B1300 Health Literacy    Frequency of need for help with medical instructions: Sometimes       Objective:   Physical Exam Vitals reviewed.  Constitutional:      General: She is not in acute distress.    Appearance: She is well-developed.  HENT:     Head: Normocephalic and atraumatic.     Right Ear: Tympanic membrane normal.     Left Ear: Tympanic membrane normal.  Eyes:     Pupils: Pupils are equal, round, and reactive to light.  Neck:     Thyroid : No thyromegaly.  Cardiovascular:     Rate and Rhythm: Normal rate and regular rhythm.     Heart sounds: Normal heart sounds. No murmur heard. Pulmonary:     Effort: Pulmonary effort is normal. No respiratory distress.     Breath sounds: Normal breath sounds. No wheezing.  Abdominal:     General: Bowel sounds are normal. There is no distension.     Palpations: Abdomen is soft.  Musculoskeletal:        General: Tenderness present. No deformity.     Cervical back: Normal range of motion and neck supple.     Right lower leg: Edema (trace) present.     Left lower leg: No edema.     Comments: Right sided weakness, pain in lumbar with flexion and extension   Skin:    General: Skin is warm and dry.  Neurological:     Mental Status: She is alert and oriented to person, place, and time.     Cranial Nerves: No cranial nerve deficit.     Motor: Weakness (right sided) present.     Gait: Gait abnormal.     Deep Tendon Reflexes: Reflexes are normal and symmetric.  Psychiatric:        Behavior: Behavior normal.        Thought Content: Thought content normal.        Judgment: Judgment normal.      BP 131/72   Pulse 79   Temp (!) 79 F (26.1 C) (Temporal)   Ht 5' 2 (1.575 m)   Wt 190 lb 9.6 oz (86.5 kg)    BMI 34.86 kg/m      Assessment &  Plan:  Allison Parker comes in today with chief complaint of Medical Management of Chronic Issues (Discuss meds for over active bladder. Needs upped.)   Diagnosis and orders addressed:  1. Essential hypertension, benign (Primary) - amLODipine  (NORVASC ) 5 MG tablet; Take 1 tablet (5 mg total) by mouth daily.  Dispense: 90 tablet; Refill: 0  2. Mixed hyperlipidemia - atorvastatin  (LIPITOR) 20 MG tablet; Take 1 tablet (20 mg total) by mouth daily.  Dispense: 90 tablet; Refill: 3  3. GAD (generalized anxiety disorder) - citalopram  (CELEXA ) 40 MG tablet; Take 1 tablet (40 mg total) by mouth daily.  Dispense: 90 tablet; Refill: 0  4. Moderate episode of recurrent major depressive disorder (HCC)  - citalopram  (CELEXA ) 40 MG tablet; Take 1 tablet (40 mg total) by mouth daily.  Dispense: 90 tablet; Refill: 0  5. Chronic bilateral low back pain with left-sided sciatica - HYDROcodone -acetaminophen  (NORCO) 10-325 MG tablet; Take 1 tablet by mouth every 6 (six) hours as needed.  Dispense: 120 tablet; Refill: 0 - HYDROcodone -acetaminophen  (NORCO) 10-325 MG tablet; Take 1 tablet by mouth every 6 (six) hours as needed.  Dispense: 120 tablet; Refill: 0 - HYDROcodone -acetaminophen  (NORCO) 10-325 MG tablet; Take 1 tablet by mouth every 6 (six) hours as needed.  Dispense: 120 tablet; Refill: 0 - meloxicam  (MOBIC ) 7.5 MG tablet; Take 1 tablet (7.5 mg total) by mouth daily.  Dispense: 90 tablet; Refill: 0  6. Uncomplicated opioid dependence (HCC) - HYDROcodone -acetaminophen  (NORCO) 10-325 MG tablet; Take 1 tablet by mouth every 6 (six) hours as needed.  Dispense: 120 tablet; Refill: 0 - HYDROcodone -acetaminophen  (NORCO) 10-325 MG tablet; Take 1 tablet by mouth every 6 (six) hours as needed.  Dispense: 120 tablet; Refill: 0 - HYDROcodone -acetaminophen  (NORCO) 10-325 MG tablet; Take 1 tablet by mouth every 6 (six) hours as needed.  Dispense: 120 tablet; Refill: 0  7. Pain  medication agreement signed  - HYDROcodone -acetaminophen  (NORCO) 10-325 MG tablet; Take 1 tablet by mouth every 6 (six) hours as needed.  Dispense: 120 tablet; Refill: 0 - HYDROcodone -acetaminophen  (NORCO) 10-325 MG tablet; Take 1 tablet by mouth every 6 (six) hours as needed.  Dispense: 120 tablet; Refill: 0 - HYDROcodone -acetaminophen  (NORCO) 10-325 MG tablet; Take 1 tablet by mouth every 6 (six) hours as needed.  Dispense: 120 tablet; Refill: 0  8. Osteoarthritis of right knee, unspecified osteoarthritis type - HYDROcodone -acetaminophen  (NORCO) 10-325 MG tablet; Take 1 tablet by mouth every 6 (six) hours as needed.  Dispense: 120 tablet; Refill: 0 - HYDROcodone -acetaminophen  (NORCO) 10-325 MG tablet; Take 1 tablet by mouth every 6 (six) hours as needed.  Dispense: 120 tablet; Refill: 0 - HYDROcodone -acetaminophen  (NORCO) 10-325 MG tablet; Take 1 tablet by mouth every 6 (six) hours as needed.  Dispense: 120 tablet; Refill: 0  9. Hypothyroidism, unspecified type  - levothyroxine  (SYNTHROID ) 25 MCG tablet; Take 1 tablet (25 mcg total) by mouth daily.  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  Dispense: 90 tablet; Refill: 0  11. Candidal skin infection - nystatin  cream (MYCOSTATIN ); Apply topically 3 (three) times daily.  Dispense: 60 g; Refill: 2 - nystatin  ointment (MYCOSTATIN ); Apply topically 2 (two) times daily.  Dispense: 60 g; Refill: 2  12. Overactive bladder  - solifenacin  (VESICARE ) 10 MG tablet; Take 1 tablet (10 mg total) by mouth daily.  Dispense: 90 tablet; Refill: 2 - Ambulatory referral to Urology  13. Insomnia, unspecified type  14. Late effect of cerebrovascular accident   56. Obesity (BMI 30-39.9)   Will hold off on labs until next visit  Will see if insurance will cover Vesicare , referral to Urologists pending  Continue current medications Patient reviewed in  [place] controlled database, no flags noted. Contract and drug screen are up to date.  Health Maintenance reviewed Diet and exercise encouraged   Follow up plan: 3 months    Bari Learn, FNP   "

## 2024-11-08 NOTE — Patient Instructions (Signed)

## 2024-11-28 ENCOUNTER — Other Ambulatory Visit: Payer: Self-pay | Admitting: Family

## 2024-11-28 DIAGNOSIS — G8929 Other chronic pain: Secondary | ICD-10-CM

## 2024-11-28 DIAGNOSIS — G890 Central pain syndrome: Secondary | ICD-10-CM

## 2025-02-07 ENCOUNTER — Ambulatory Visit: Admitting: Family

## 2025-02-11 ENCOUNTER — Ambulatory Visit: Admitting: Urology

## 2025-06-17 ENCOUNTER — Ambulatory Visit: Payer: Self-pay
